# Patient Record
Sex: Female | Born: 1960 | Race: Black or African American | Hispanic: No | Marital: Single | Smoking: Current every day smoker
Health system: Southern US, Community
[De-identification: ages and names within clinical notes are randomized; demographics above are authoritative.]

## PROBLEM LIST (undated history)

## (undated) DIAGNOSIS — K759 Inflammatory liver disease, unspecified: Secondary | ICD-10-CM

## (undated) DIAGNOSIS — L658 Other specified nonscarring hair loss: Secondary | ICD-10-CM

## (undated) DIAGNOSIS — A599 Trichomoniasis, unspecified: Secondary | ICD-10-CM

## (undated) DIAGNOSIS — Z62810 Personal history of physical and sexual abuse in childhood: Secondary | ICD-10-CM

## (undated) DIAGNOSIS — F32A Depression, unspecified: Secondary | ICD-10-CM

## (undated) DIAGNOSIS — A630 Anogenital (venereal) warts: Secondary | ICD-10-CM

## (undated) DIAGNOSIS — R112 Nausea with vomiting, unspecified: Secondary | ICD-10-CM

## (undated) DIAGNOSIS — B977 Papillomavirus as the cause of diseases classified elsewhere: Secondary | ICD-10-CM

## (undated) DIAGNOSIS — R7303 Prediabetes: Secondary | ICD-10-CM

## (undated) DIAGNOSIS — F329 Major depressive disorder, single episode, unspecified: Secondary | ICD-10-CM

## (undated) DIAGNOSIS — I10 Essential (primary) hypertension: Secondary | ICD-10-CM

## (undated) DIAGNOSIS — M810 Age-related osteoporosis without current pathological fracture: Secondary | ICD-10-CM

## (undated) DIAGNOSIS — M1990 Unspecified osteoarthritis, unspecified site: Secondary | ICD-10-CM

## (undated) DIAGNOSIS — E785 Hyperlipidemia, unspecified: Secondary | ICD-10-CM

## (undated) DIAGNOSIS — B9689 Other specified bacterial agents as the cause of diseases classified elsewhere: Secondary | ICD-10-CM

## (undated) DIAGNOSIS — Z9889 Other specified postprocedural states: Secondary | ICD-10-CM

## (undated) DIAGNOSIS — N760 Acute vaginitis: Secondary | ICD-10-CM

## (undated) HISTORY — DX: Hyperlipidemia, unspecified: E78.5

## (undated) HISTORY — DX: Trichomoniasis, unspecified: A59.9

## (undated) HISTORY — DX: Essential (primary) hypertension: I10

## (undated) HISTORY — DX: Major depressive disorder, single episode, unspecified: F32.9

## (undated) HISTORY — DX: Age-related osteoporosis without current pathological fracture: M81.0

## (undated) HISTORY — DX: Other specified bacterial agents as the cause of diseases classified elsewhere: B96.89

## (undated) HISTORY — DX: Personal history of physical and sexual abuse in childhood: Z62.810

## (undated) HISTORY — DX: Morbid (severe) obesity due to excess calories: E66.01

## (undated) HISTORY — PX: TUBAL LIGATION: SHX77

## (undated) HISTORY — DX: Unspecified osteoarthritis, unspecified site: M19.90

## (undated) HISTORY — PX: COLONOSCOPY: SHX174

## (undated) HISTORY — DX: Anogenital (venereal) warts: A63.0

## (undated) HISTORY — DX: Other specified nonscarring hair loss: L65.8

## (undated) HISTORY — DX: Papillomavirus as the cause of diseases classified elsewhere: B97.7

## (undated) HISTORY — PX: CHOLECYSTECTOMY: SHX55

## (undated) HISTORY — DX: Depression, unspecified: F32.A

## (undated) HISTORY — DX: Other specified bacterial agents as the cause of diseases classified elsewhere: N76.0

---
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
## 1997-09-04 ENCOUNTER — Ambulatory Visit: Admission: RE | Admit: 1997-09-04 | Discharge: 1997-09-04 | Payer: Self-pay | Admitting: Internal Medicine

## 1997-09-11 ENCOUNTER — Ambulatory Visit (HOSPITAL_COMMUNITY): Admission: RE | Admit: 1997-09-11 | Discharge: 1997-09-11 | Payer: Self-pay | Admitting: Otolaryngology

## 1997-10-13 ENCOUNTER — Encounter: Admission: RE | Admit: 1997-10-13 | Discharge: 1997-10-13 | Payer: Self-pay | Admitting: Internal Medicine

## 1997-11-30 ENCOUNTER — Emergency Department (HOSPITAL_COMMUNITY): Admission: EM | Admit: 1997-11-30 | Discharge: 1997-11-30 | Payer: Self-pay | Admitting: Emergency Medicine

## 1997-12-20 ENCOUNTER — Emergency Department (HOSPITAL_COMMUNITY): Admission: EM | Admit: 1997-12-20 | Discharge: 1997-12-20 | Payer: Self-pay | Admitting: Emergency Medicine

## 1998-06-12 ENCOUNTER — Emergency Department (HOSPITAL_COMMUNITY): Admission: EM | Admit: 1998-06-12 | Discharge: 1998-06-12 | Payer: Self-pay | Admitting: Emergency Medicine

## 1998-08-28 ENCOUNTER — Emergency Department (HOSPITAL_COMMUNITY): Admission: EM | Admit: 1998-08-28 | Discharge: 1998-08-28 | Payer: Self-pay | Admitting: Emergency Medicine

## 1998-12-14 ENCOUNTER — Emergency Department (HOSPITAL_COMMUNITY): Admission: EM | Admit: 1998-12-14 | Discharge: 1998-12-14 | Payer: Self-pay | Admitting: Emergency Medicine

## 1998-12-14 ENCOUNTER — Encounter: Payer: Self-pay | Admitting: Emergency Medicine

## 1999-05-27 ENCOUNTER — Emergency Department (HOSPITAL_COMMUNITY): Admission: EM | Admit: 1999-05-27 | Discharge: 1999-05-27 | Payer: Self-pay | Admitting: Emergency Medicine

## 1999-06-03 ENCOUNTER — Encounter: Admission: RE | Admit: 1999-06-03 | Discharge: 1999-06-03 | Payer: Self-pay | Admitting: *Deleted

## 1999-07-12 ENCOUNTER — Other Ambulatory Visit: Admission: RE | Admit: 1999-07-12 | Discharge: 1999-07-27 | Payer: Self-pay | Admitting: *Deleted

## 1999-07-12 ENCOUNTER — Encounter: Admission: RE | Admit: 1999-07-12 | Discharge: 1999-07-12 | Payer: Self-pay | Admitting: Internal Medicine

## 1999-09-14 ENCOUNTER — Encounter: Admission: RE | Admit: 1999-09-14 | Discharge: 1999-09-14 | Payer: Self-pay | Admitting: Internal Medicine

## 1999-10-04 ENCOUNTER — Encounter: Admission: RE | Admit: 1999-10-04 | Discharge: 1999-10-04 | Payer: Self-pay | Admitting: Internal Medicine

## 1999-11-15 ENCOUNTER — Encounter: Admission: RE | Admit: 1999-11-15 | Discharge: 1999-11-15 | Payer: Self-pay | Admitting: Internal Medicine

## 2000-03-16 ENCOUNTER — Encounter: Admission: RE | Admit: 2000-03-16 | Discharge: 2000-03-16 | Payer: Self-pay | Admitting: Internal Medicine

## 2000-08-25 ENCOUNTER — Emergency Department (HOSPITAL_COMMUNITY): Admission: EM | Admit: 2000-08-25 | Discharge: 2000-08-25 | Payer: Self-pay | Admitting: Emergency Medicine

## 2001-12-03 ENCOUNTER — Emergency Department (HOSPITAL_COMMUNITY): Admission: EM | Admit: 2001-12-03 | Discharge: 2001-12-03 | Payer: Self-pay | Admitting: Emergency Medicine

## 2001-12-25 ENCOUNTER — Encounter: Admission: RE | Admit: 2001-12-25 | Discharge: 2001-12-25 | Payer: Self-pay | Admitting: *Deleted

## 2002-01-10 ENCOUNTER — Encounter: Admission: RE | Admit: 2002-01-10 | Discharge: 2002-01-10 | Payer: Self-pay | Admitting: Obstetrics and Gynecology

## 2002-05-21 ENCOUNTER — Encounter: Payer: Self-pay | Admitting: Internal Medicine

## 2002-05-21 ENCOUNTER — Encounter: Admission: RE | Admit: 2002-05-21 | Discharge: 2002-05-21 | Payer: Self-pay | Admitting: Internal Medicine

## 2002-09-30 ENCOUNTER — Emergency Department (HOSPITAL_COMMUNITY): Admission: EM | Admit: 2002-09-30 | Discharge: 2002-09-30 | Payer: Self-pay | Admitting: *Deleted

## 2002-09-30 ENCOUNTER — Encounter: Payer: Self-pay | Admitting: *Deleted

## 2003-04-14 ENCOUNTER — Emergency Department (HOSPITAL_COMMUNITY): Admission: EM | Admit: 2003-04-14 | Discharge: 2003-04-14 | Payer: Self-pay | Admitting: *Deleted

## 2003-04-18 ENCOUNTER — Emergency Department (HOSPITAL_COMMUNITY): Admission: EM | Admit: 2003-04-18 | Discharge: 2003-04-18 | Payer: Self-pay | Admitting: Emergency Medicine

## 2003-04-21 ENCOUNTER — Encounter: Admission: RE | Admit: 2003-04-21 | Discharge: 2003-04-21 | Payer: Self-pay | Admitting: Internal Medicine

## 2003-06-23 ENCOUNTER — Encounter: Admission: RE | Admit: 2003-06-23 | Discharge: 2003-06-23 | Payer: Self-pay | Admitting: Internal Medicine

## 2003-07-24 ENCOUNTER — Encounter: Admission: RE | Admit: 2003-07-24 | Discharge: 2003-07-24 | Payer: Self-pay | Admitting: Obstetrics and Gynecology

## 2003-07-30 ENCOUNTER — Ambulatory Visit (HOSPITAL_COMMUNITY): Admission: RE | Admit: 2003-07-30 | Discharge: 2003-07-30 | Payer: Self-pay | Admitting: Obstetrics and Gynecology

## 2004-05-13 ENCOUNTER — Ambulatory Visit: Payer: Self-pay | Admitting: Internal Medicine

## 2004-07-22 ENCOUNTER — Encounter (INDEPENDENT_AMBULATORY_CARE_PROVIDER_SITE_OTHER): Payer: Self-pay | Admitting: Ophthalmology

## 2004-07-22 ENCOUNTER — Ambulatory Visit: Payer: Self-pay | Admitting: Internal Medicine

## 2004-07-22 LAB — CONVERTED CEMR LAB: Pap Smear: NORMAL

## 2005-02-07 ENCOUNTER — Emergency Department (HOSPITAL_COMMUNITY): Admission: EM | Admit: 2005-02-07 | Discharge: 2005-02-07 | Payer: Self-pay | Admitting: Emergency Medicine

## 2005-04-11 ENCOUNTER — Ambulatory Visit: Payer: Self-pay | Admitting: Internal Medicine

## 2005-04-11 ENCOUNTER — Encounter (INDEPENDENT_AMBULATORY_CARE_PROVIDER_SITE_OTHER): Payer: Self-pay | Admitting: Pulmonary Disease

## 2005-04-12 ENCOUNTER — Ambulatory Visit: Payer: Self-pay | Admitting: Internal Medicine

## 2005-05-05 ENCOUNTER — Ambulatory Visit (HOSPITAL_COMMUNITY): Admission: RE | Admit: 2005-05-05 | Discharge: 2005-05-05 | Payer: Self-pay | Admitting: Family Medicine

## 2005-07-14 ENCOUNTER — Ambulatory Visit: Payer: Self-pay | Admitting: Internal Medicine

## 2005-07-14 ENCOUNTER — Encounter (INDEPENDENT_AMBULATORY_CARE_PROVIDER_SITE_OTHER): Payer: Self-pay | Admitting: Pulmonary Disease

## 2005-09-06 ENCOUNTER — Emergency Department (HOSPITAL_COMMUNITY): Admission: EM | Admit: 2005-09-06 | Discharge: 2005-09-06 | Payer: Self-pay | Admitting: Emergency Medicine

## 2006-03-23 ENCOUNTER — Encounter (INDEPENDENT_AMBULATORY_CARE_PROVIDER_SITE_OTHER): Payer: Self-pay | Admitting: Ophthalmology

## 2006-03-23 DIAGNOSIS — I10 Essential (primary) hypertension: Secondary | ICD-10-CM

## 2006-03-23 DIAGNOSIS — Z720 Tobacco use: Secondary | ICD-10-CM | POA: Insufficient documentation

## 2006-03-23 DIAGNOSIS — B977 Papillomavirus as the cause of diseases classified elsewhere: Secondary | ICD-10-CM

## 2006-03-23 DIAGNOSIS — E785 Hyperlipidemia, unspecified: Secondary | ICD-10-CM | POA: Insufficient documentation

## 2006-04-08 ENCOUNTER — Emergency Department (HOSPITAL_COMMUNITY): Admission: EM | Admit: 2006-04-08 | Discharge: 2006-04-08 | Payer: Self-pay | Admitting: *Deleted

## 2006-04-19 ENCOUNTER — Ambulatory Visit: Payer: Self-pay | Admitting: Hospitalist

## 2006-04-19 ENCOUNTER — Encounter (INDEPENDENT_AMBULATORY_CARE_PROVIDER_SITE_OTHER): Payer: Self-pay | Admitting: Pulmonary Disease

## 2006-08-07 ENCOUNTER — Telehealth: Payer: Self-pay | Admitting: *Deleted

## 2006-08-24 ENCOUNTER — Ambulatory Visit: Payer: Self-pay | Admitting: Internal Medicine

## 2006-08-24 ENCOUNTER — Encounter (INDEPENDENT_AMBULATORY_CARE_PROVIDER_SITE_OTHER): Payer: Self-pay | Admitting: Pulmonary Disease

## 2006-08-24 ENCOUNTER — Other Ambulatory Visit: Admission: RE | Admit: 2006-08-24 | Discharge: 2006-08-24 | Payer: Self-pay | Admitting: Internal Medicine

## 2006-08-24 LAB — CONVERTED CEMR LAB
BUN: 14 mg/dL (ref 6–23)
Beta hcg, urine, semiquantitative: NEGATIVE
Bilirubin Urine: NEGATIVE
CO2: 22 meq/L (ref 19–32)
Calcium: 8.8 mg/dL (ref 8.4–10.5)
Candida species: NEGATIVE
Chlamydia, DNA Probe: NEGATIVE
Chloride: 105 meq/L (ref 96–112)
Creatinine, Ser: 0.69 mg/dL (ref 0.40–1.20)
GC Probe Amp, Genital: NEGATIVE
Gardnerella vaginalis: NEGATIVE
Glucose, Bld: 82 mg/dL (ref 70–99)
Hemoglobin, Urine: NEGATIVE
Ketones, ur: NEGATIVE mg/dL
Leukocytes, UA: NEGATIVE
Nitrite: NEGATIVE
Potassium: 4.1 meq/L (ref 3.5–5.3)
Protein, ur: NEGATIVE mg/dL
Sodium: 140 meq/L (ref 135–145)
Specific Gravity, Urine: 1.026 (ref 1.005–1.03)
TSH: 0.894 microintl units/mL (ref 0.350–5.50)
Trichomonal Vaginitis: NEGATIVE
Urine Glucose: NEGATIVE mg/dL
Urobilinogen, UA: 0.2 (ref 0.0–1.0)
pH: 6 (ref 5.0–8.0)

## 2006-09-06 ENCOUNTER — Telehealth: Payer: Self-pay | Admitting: *Deleted

## 2006-09-12 ENCOUNTER — Ambulatory Visit (HOSPITAL_COMMUNITY): Admission: RE | Admit: 2006-09-12 | Discharge: 2006-09-12 | Payer: Self-pay | Admitting: Obstetrics and Gynecology

## 2006-09-14 ENCOUNTER — Ambulatory Visit: Payer: Self-pay | Admitting: Internal Medicine

## 2006-10-11 ENCOUNTER — Encounter (INDEPENDENT_AMBULATORY_CARE_PROVIDER_SITE_OTHER): Payer: Self-pay | Admitting: Pulmonary Disease

## 2006-10-11 ENCOUNTER — Ambulatory Visit: Payer: Self-pay | Admitting: Internal Medicine

## 2006-10-11 LAB — CONVERTED CEMR LAB
Bilirubin Urine: NEGATIVE
Hemoglobin, Urine: NEGATIVE
Ketones, ur: NEGATIVE mg/dL
Leukocytes, UA: NEGATIVE
Nitrite: NEGATIVE
Protein, ur: NEGATIVE mg/dL
Specific Gravity, Urine: 1.024 (ref 1.005–1.03)
Urine Glucose: NEGATIVE mg/dL
Urobilinogen, UA: 0.2 (ref 0.0–1.0)
pH: 8 (ref 5.0–8.0)

## 2006-10-20 ENCOUNTER — Ambulatory Visit (HOSPITAL_COMMUNITY): Admission: RE | Admit: 2006-10-20 | Discharge: 2006-10-20 | Payer: Self-pay | Admitting: Obstetrics & Gynecology

## 2006-10-24 ENCOUNTER — Ambulatory Visit: Payer: Self-pay

## 2006-11-15 ENCOUNTER — Encounter (INDEPENDENT_AMBULATORY_CARE_PROVIDER_SITE_OTHER): Payer: Self-pay | Admitting: Internal Medicine

## 2007-02-27 ENCOUNTER — Ambulatory Visit: Payer: Self-pay | Admitting: Internal Medicine

## 2007-02-27 LAB — CONVERTED CEMR LAB
ALT: 13 units/L (ref 0–35)
AST: 14 units/L (ref 0–37)
Albumin: 3.9 g/dL (ref 3.5–5.2)
Alkaline Phosphatase: 54 units/L (ref 39–117)
BUN: 10 mg/dL (ref 6–23)
CO2: 27 meq/L (ref 19–32)
Calcium: 8.8 mg/dL (ref 8.4–10.5)
Chloride: 107 meq/L (ref 96–112)
Cholesterol: 182 mg/dL (ref 0–200)
Creatinine, Ser: 0.7 mg/dL (ref 0.40–1.20)
Glucose, Bld: 83 mg/dL (ref 70–99)
HDL: 54 mg/dL (ref >39)
LDL Cholesterol: 118 mg/dL — ABNORMAL HIGH (ref 0–99)
Lipase: 26 units/L (ref 0–75)
Potassium: 4.2 meq/L (ref 3.5–5.3)
Sodium: 142 meq/L (ref 135–145)
Total Bilirubin: 0.6 mg/dL (ref 0.3–1.2)
Total CHOL/HDL Ratio: 3.4
Total Protein: 6.4 g/dL (ref 6.0–8.3)
Triglycerides: 52 mg/dL (ref <150)
VLDL: 10 mg/dL (ref 0–40)

## 2007-03-22 ENCOUNTER — Ambulatory Visit: Payer: Self-pay | Admitting: Internal Medicine

## 2007-06-11 ENCOUNTER — Ambulatory Visit: Payer: Self-pay | Admitting: Internal Medicine

## 2007-07-24 ENCOUNTER — Ambulatory Visit: Payer: Self-pay | Admitting: Internal Medicine

## 2007-09-21 ENCOUNTER — Ambulatory Visit (HOSPITAL_COMMUNITY): Admission: RE | Admit: 2007-09-21 | Discharge: 2007-09-21 | Payer: Self-pay | Admitting: Internal Medicine

## 2007-10-03 ENCOUNTER — Encounter (INDEPENDENT_AMBULATORY_CARE_PROVIDER_SITE_OTHER): Payer: Self-pay | Admitting: Internal Medicine

## 2007-10-23 ENCOUNTER — Encounter (INDEPENDENT_AMBULATORY_CARE_PROVIDER_SITE_OTHER): Payer: Self-pay | Admitting: Internal Medicine

## 2007-10-23 ENCOUNTER — Ambulatory Visit: Payer: Self-pay | Admitting: Internal Medicine

## 2007-10-23 LAB — CONVERTED CEMR LAB
BUN: 12 mg/dL (ref 6–23)
CO2: 23 meq/L (ref 19–32)
Calcium: 8.9 mg/dL (ref 8.4–10.5)
Chloride: 105 meq/L (ref 96–112)
Creatinine, Ser: 0.78 mg/dL (ref 0.40–1.20)
Glucose, Bld: 82 mg/dL (ref 70–99)
HCT: 42.5 % (ref 36.0–46.0)
Hemoglobin: 14.9 g/dL (ref 12.0–15.0)
MCHC: 35.1 g/dL (ref 30.0–36.0)
MCV: 89.3 fL (ref 78.0–100.0)
Platelets: 244 10*3/uL (ref 150–400)
Potassium: 3.9 meq/L (ref 3.5–5.3)
RBC: 4.76 M/uL (ref 3.87–5.11)
RDW: 13.5 % (ref 11.5–15.5)
Sodium: 139 meq/L (ref 135–145)
TSH: 0.919 microintl units/mL (ref 0.350–5.50)
WBC: 4.8 10*3/uL (ref 4.0–10.5)

## 2007-10-30 ENCOUNTER — Encounter (INDEPENDENT_AMBULATORY_CARE_PROVIDER_SITE_OTHER): Payer: Self-pay | Admitting: Internal Medicine

## 2007-12-10 ENCOUNTER — Telehealth: Payer: Self-pay | Admitting: *Deleted

## 2008-02-27 ENCOUNTER — Ambulatory Visit: Payer: Self-pay | Admitting: Internal Medicine

## 2008-02-27 LAB — CONVERTED CEMR LAB
Bilirubin Urine: NEGATIVE
Glucose, Urine, Semiquant: NEGATIVE
Ketones, urine, test strip: NEGATIVE
Protein, U semiquant: NEGATIVE
Specific Gravity, Urine: 1.01
Urobilinogen, UA: 0.2
WBC Urine, dipstick: NEGATIVE
pH: 7.5

## 2008-03-03 ENCOUNTER — Ambulatory Visit: Payer: Self-pay | Admitting: Internal Medicine

## 2008-03-25 ENCOUNTER — Ambulatory Visit: Payer: Self-pay | Admitting: Internal Medicine

## 2008-07-11 ENCOUNTER — Ambulatory Visit: Payer: Self-pay | Admitting: Internal Medicine

## 2008-09-22 ENCOUNTER — Encounter (INDEPENDENT_AMBULATORY_CARE_PROVIDER_SITE_OTHER): Payer: Self-pay | Admitting: Internal Medicine

## 2008-09-30 ENCOUNTER — Ambulatory Visit (HOSPITAL_COMMUNITY): Admission: RE | Admit: 2008-09-30 | Discharge: 2008-09-30 | Payer: Self-pay | Admitting: Internal Medicine

## 2008-10-21 ENCOUNTER — Encounter (INDEPENDENT_AMBULATORY_CARE_PROVIDER_SITE_OTHER): Payer: Self-pay | Admitting: Internal Medicine

## 2008-10-21 ENCOUNTER — Ambulatory Visit: Payer: Self-pay | Admitting: Internal Medicine

## 2008-10-22 ENCOUNTER — Encounter (INDEPENDENT_AMBULATORY_CARE_PROVIDER_SITE_OTHER): Payer: Self-pay | Admitting: Internal Medicine

## 2008-10-22 LAB — CONVERTED CEMR LAB
Candida species: NEGATIVE
Gardnerella vaginalis: POSITIVE — AB
Trichomonal Vaginitis: POSITIVE — AB

## 2008-10-28 ENCOUNTER — Ambulatory Visit: Payer: Self-pay | Admitting: Internal Medicine

## 2008-11-04 LAB — CONVERTED CEMR LAB
BUN: 13 mg/dL (ref 6–23)
CO2: 25 meq/L (ref 19–32)
Calcium: 9 mg/dL (ref 8.4–10.5)
Chloride: 105 meq/L (ref 96–112)
Cholesterol: 174 mg/dL (ref 0–200)
Creatinine, Ser: 0.84 mg/dL (ref 0.40–1.20)
GFR calc Af Amer: >60 mL/min (ref >60)
GFR calc non Af Amer: >60 mL/min (ref >60)
Glucose, Bld: 89 mg/dL (ref 70–99)
HDL: 42 mg/dL (ref >39)
LDL Cholesterol: 110 mg/dL — ABNORMAL HIGH (ref 0–99)
Potassium: 4.6 meq/L (ref 3.5–5.3)
Sodium: 141 meq/L (ref 135–145)
Total CHOL/HDL Ratio: 4.1
Triglycerides: 108 mg/dL (ref <150)
VLDL: 22 mg/dL (ref 0–40)

## 2008-12-09 ENCOUNTER — Ambulatory Visit: Payer: Self-pay | Admitting: Infectious Diseases

## 2009-01-03 ENCOUNTER — Emergency Department (HOSPITAL_COMMUNITY): Admission: EM | Admit: 2009-01-03 | Discharge: 2009-01-03 | Payer: Self-pay | Admitting: Emergency Medicine

## 2009-03-03 ENCOUNTER — Ambulatory Visit: Payer: Self-pay | Admitting: Internal Medicine

## 2009-03-03 DIAGNOSIS — M1710 Unilateral primary osteoarthritis, unspecified knee: Secondary | ICD-10-CM

## 2009-03-03 DIAGNOSIS — IMO0002 Reserved for concepts with insufficient information to code with codable children: Secondary | ICD-10-CM | POA: Insufficient documentation

## 2009-06-01 ENCOUNTER — Telehealth: Payer: Self-pay | Admitting: Internal Medicine

## 2009-08-04 ENCOUNTER — Ambulatory Visit: Payer: Self-pay | Admitting: Internal Medicine

## 2009-08-04 DIAGNOSIS — F418 Other specified anxiety disorders: Secondary | ICD-10-CM | POA: Insufficient documentation

## 2009-08-04 LAB — CONVERTED CEMR LAB
Chlamydia, DNA Probe: NEGATIVE
GC Probe Amp, Genital: NEGATIVE

## 2009-08-05 ENCOUNTER — Ambulatory Visit: Payer: Self-pay | Admitting: Internal Medicine

## 2009-08-07 LAB — CONVERTED CEMR LAB
ALT: 20 units/L (ref 0–35)
AST: 21 units/L (ref 0–37)
Albumin: 4 g/dL (ref 3.5–5.2)
Alkaline Phosphatase: 66 units/L (ref 39–117)
BUN: 11 mg/dL (ref 6–23)
CO2: 23 meq/L (ref 19–32)
Calcium: 9.2 mg/dL (ref 8.4–10.5)
Chloride: 102 meq/L (ref 96–112)
Cholesterol: 232 mg/dL — ABNORMAL HIGH (ref 0–200)
Creatinine, Ser: 0.76 mg/dL (ref 0.40–1.20)
Glucose, Bld: 82 mg/dL (ref 70–99)
HDL: 50 mg/dL (ref >39)
LDL Cholesterol: 161 mg/dL — ABNORMAL HIGH (ref 0–99)
Potassium: 3.7 meq/L (ref 3.5–5.3)
Sodium: 137 meq/L (ref 135–145)
Total Bilirubin: 1.2 mg/dL (ref 0.3–1.2)
Total CHOL/HDL Ratio: 4.6
Total Protein: 6.9 g/dL (ref 6.0–8.3)
Triglycerides: 104 mg/dL (ref <150)
VLDL: 21 mg/dL (ref 0–40)

## 2009-09-16 ENCOUNTER — Ambulatory Visit: Payer: Self-pay | Admitting: Internal Medicine

## 2010-03-09 ENCOUNTER — Ambulatory Visit: Payer: Self-pay | Admitting: Internal Medicine

## 2010-03-09 DIAGNOSIS — K219 Gastro-esophageal reflux disease without esophagitis: Secondary | ICD-10-CM | POA: Insufficient documentation

## 2010-03-24 ENCOUNTER — Ambulatory Visit (HOSPITAL_COMMUNITY): Admission: RE | Admit: 2010-03-24 | Discharge: 2010-03-24 | Payer: Self-pay | Admitting: Internal Medicine

## 2010-04-27 ENCOUNTER — Ambulatory Visit: Payer: Self-pay | Admitting: Internal Medicine

## 2010-05-03 LAB — CONVERTED CEMR LAB: Pap Smear: NEGATIVE

## 2010-05-21 ENCOUNTER — Emergency Department (HOSPITAL_COMMUNITY)
Admission: EM | Admit: 2010-05-21 | Discharge: 2010-05-21 | Payer: Self-pay | Source: Home / Self Care | Admitting: Emergency Medicine

## 2010-05-22 ENCOUNTER — Emergency Department (HOSPITAL_COMMUNITY)
Admission: EM | Admit: 2010-05-22 | Discharge: 2010-05-22 | Payer: Self-pay | Source: Home / Self Care | Admitting: Emergency Medicine

## 2010-06-27 ENCOUNTER — Encounter: Payer: Self-pay | Admitting: Internal Medicine

## 2010-07-06 ENCOUNTER — Ambulatory Visit: Admit: 2010-07-06 | Payer: Self-pay | Admitting: Internal Medicine

## 2010-07-08 NOTE — Assessment & Plan Note (Signed)
Summary: CHECKUP/SB.   Vital Signs:  Patient profile:   50 year old female Height:      66 inches (167.64 cm) Weight:      294.0 pounds (133.64 kg) BMI:     47.62 Temp:     97.0 degrees F (36.11 degrees C) oral Pulse rate:   77 / minute BP sitting:   123 / 84  (right arm) Cuff size:   large  Vitals Entered By: Krystal Eaton Duncan Dull) (August 04, 2009 9:18 AM) CC: vaginal discharge Is Patient Diabetic? No Pain Assessment Patient in pain? no      Nutritional Status BMI of > 30 = obese  Have you ever been in a relationship where you felt threatened, hurt or afraid?No   Does patient need assistance? Functional Status Self care Ambulation Normal   Primary Care Provider:  Zoila Shutter MD  CC:  vaginal discharge.  History of Present Illness: 50 yr. old who comes in to establish primary care.  She wants to have STD check today. In 5/10 she had trich. Her partner of 4 years is probably still unfaithful.  She needs her HCTZ filled.   She is depressed. Her father died last year. She is in an unhealthy relationship. She has been without work for 3 years. She decides suicidal ideations.     Preventive Screening-Counseling & Management  Alcohol-Tobacco     Smoking Status: current     Smoking Cessation Counseling: yes     Packs/Day: 0.5     Year Started: 1970's     Year Quit: 2 weeks  Current Medications (verified): 1)  Hydrochlorothiazide 25 Mg  Tabs (Hydrochlorothiazide) .... Take 1 Tablet By Mouth Once A Day 2)  Fish Oil Concentrate 1000 Mg  Caps (Omega-3 Fatty Acids) .... Take 1 Tablet By Mouth Once A Day 3)  Oyster Calcium 500 Mg  Tabs (Oyster Shell) .... Take 1 Tablet By Mouth Two Times A Day  Allergies: 1)  ! 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 CV:  Denies chest pain or discomfort and palpitations. Resp:  Denies cough and shortness of breath. GI:  Denies abdominal pain, nausea, and vomiting. GU:  Complains of discharge; minimal-may be her baseline. Psych:  Complains of depression; denies suicidal thoughts/plans.  Physical Exam  General:  alert and well-nourished.   Head:  normocephalic and atraumatic.   Eyes:  vision grossly intact.   Neck:  supple and full ROM.   Lungs:  normal respiratory effort and normal breath sounds.   Heart:  normal rate and regular rhythm.   Abdomen:  soft, non-tender, and normal bowel sounds.   Genitalia:  external condyloma acuminatum lesions in perineal area and perirectal area, minimal discharge in vaginal vault. GC/chlymydia, wet prep obtained  Psych:  Oriented X3, memory intact for recent and remote, and  depressed affect.     Impression & Recommendations:  Problem # 1:  DEPRESSIVE DISORDER (ICD-311) I spent a lot of time with pt. today discussing this. Will begin sertaline 100mg  1/2 a pill a day for 6 days, then 1 a day. I have told her it may  take  several weeks for her yo feel a difference. Risks benefits, and side effects explains. I will see her back in one month for follow up Her updated medication list for this problem includes:    Sertraline Hcl 100 Mg Tabs (Sertraline  hcl) .Marland Kitchen... 1/2 pill a day for 6 days, then 1 a day  Problem # 2:  HYPERTENSION (ICD-401.9) Well controlled on present medication. Refilled. Her updated medication list for this problem includes:    Hydrochlorothiazide 25 Mg Tabs (Hydrochlorothiazide) .Marland Kitchen... Take 1 tablet by mouth once a day  Future Orders: T-Comprehensive Metabolic Panel ([id number]) ... 08/11/2009  Problem # 3:  SCREENING EXAMINATION FOR VENEREAL DISEASE (ICD-V74.5) Pelvic exam done today, GC/Chlymydia and wet prep pending. Future Orders: T-HIV Antibody  (Reflex) ([id number]) ... 08/11/2009  Problem # 4:  MORBID OBESITY (ICD-278.01) Pt. has done well keeping off 100 pounds, but has gained 5 back. Enccouraged her that she was doing great and after treating the depression this would get easier.   Problem # 5:  HYPERLIPIDEMIA (ICD-272.4)  Future Orders: T-Lipid Profile ([id number]) ... 08/11/2009  Problem # 6:  Preventive Health Care (ICD-V70.0) Mammogram ordered. pap normal in 5/10. She will return for fasting blood work.  Complete Medication List: 1)  Hydrochlorothiazide 25 Mg Tabs (Hydrochlorothiazide) .... Take 1 tablet by mouth once a day 2)  Fish Oil Concentrate 1000 Mg Caps (Omega-3 fatty acids) .... Take 1 tablet by mouth once a day 3)  Oyster Calcium 500 Mg Tabs (Oyster shell) .... Take 1 tablet by mouth two times a day 4)  Sertraline Hcl 100 Mg Tabs (Sertraline hcl) .... 1/2 pill a day for 6 days, then 1 a day  Other Orders: Mammogram (Screening) (Mammo) T-Chlamydia & GC Probe, Genital ([id number]) T-Wet Prep by Molecular Probe [phone])  Patient Instructions: 1)  Please schedule a follow-up appointment in 1 month. 2)  Please take the Sertaline as directed on the bottle. 1/2 a pill for 6 days, then 1 pill a day. 3)  Please schedule patient to return for fasting labs this week. 4)  We will let you know when results of your tests come back. 5)  Take good care of  yourself!!! Prescriptions: HYDROCHLOROTHIAZIDE 25 MG  TABS (HYDROCHLOROTHIAZIDE) Take 1 tablet by mouth once a day  #31 x 11   Entered and Authorized by:   Zoila Shutter MD   Signed by:   Zoila Shutter MD on 08/04/2009   Method used:   Electronically to        [facility]  [address] [id number] (retail)       [address]       [place], [place]  [address]       Ph: [phone] or [phone]       Fax: [phone]   RxID:   [id number] SERTRALINE HCL 100 MG TABS (SERTRALINE HCL) 1/2 pill a day for 6 days, then 1 a day  #31 x 6   Entered and Authorized by:   Zoila Shutter MD   Signed by:   Zoila Shutter MD on 08/04/2009   Method used:   Electronically to  [facility]  [address] [id number] (retail)       [address]       [place], [place]  [address]       Ph: [phone] or [phone]       Fax: [phone]   RxID:   [id number]  Process Orders Check Orders Results:     [facility]: ABN not required for this insurance Tests Sent for requisitioning (August 04, 2009 9:03 PM):     08/04/2009: [facility] -- T-Chlamydia & GC Probe, Genital [[id number]] (signed)     08/04/2009: [facility] -- T-Wet Prep by Molecular Probe [phone] (signed)     08/11/2009: [facility] -- T-Lipid Profile [id number] (signed)     08/11/2009: [facility] -- T-Comprehensive Metabolic Panel [id number] (signed)     08/11/2009: [facility] -- T-HIV Antibody  (Reflex) [[id number]] (signed)    Prevention & Chronic Care Immunizations   Influenza vaccine: Fluvax Non-MCR  (03/03/2009)   Influenza vaccine deferral: Deferred  (03/03/2009)    Tetanus booster: Not documented    Pneumococcal vaccine: Not documented  Other Screening   Pap smear: NEGATIVE FOR INTRAEPITHELIAL LESIONS OR MALIGNANCY.  (10/21/2008)   Pap smear action/deferral: GYN Referral   (03/03/2009)   Pap smear due: 11/2008    Mammogram: ASSESSMENT: Negative - BI-RADS 1^MM DIGITAL SCREENING  (09/30/2008)   Mammogram action/deferral: Ordered  (08/04/2009)   Mammogram due: 10/2008   Smoking status: current  (08/04/2009)   Smoking cessation counseling: yes  (08/04/2009)  Lipids   Total Cholesterol: 174  (10/28/2008)   LDL: 110  (10/28/2008)   LDL Direct: Not documented   HDL: 42  (10/28/2008)   Triglycerides: 108  (10/28/2008)    SGOT (AST): 14  (02/27/2007)   BMP action: Ordered   SGPT (ALT): 13  (02/27/2007) CMP ordered    Alkaline phosphatase: 54  (02/27/2007)   Total bilirubin: 0.6  (02/27/2007)  Hypertension   Last Blood Pressure: 123 / 84  (08/04/2009)   Serum creatinine: 0.84  (10/28/2008)   Serum potassium 4.6  (10/28/2008) CMP ordered   Self-Management Support :   Personal Goals (by the next clinic visit) :      Personal blood pressure goal: 140/90  (03/03/2009)     Personal LDL goal: 130  (03/03/2009)    Patient will work on the following items until the next clinic visit to reach self-care goals:     Medications and monitoring: take my medicines every day  (08/04/2009)     Eating: eat foods that are low in salt, eat baked foods instead of fried foods  (08/04/2009)     Activity: join a walking program  (08/04/2009)    Hypertension self-management support: Not documented    Lipid self-management support: Not documented    Nursing Instructions: Schedule screening mammogram (see order)    Process Orders Check Orders Results:     [facility]: ABN not required for this insurance Tests Sent for requisitioning (August 04, 2009 9:03 PM):     08/04/2009: [facility] -- T-Chlamydia & GC Probe, Genital [[id number]] (signed)     08/04/2009: [facility] -- T-Wet Prep by Molecular Probe [phone] (signed)     08/11/2009: [facility] -- T-Lipid Profile [phone] (signed)      08/11/2009: [facility] -- T-Comprehensive Metabolic Panel [[id number]] (signed)     08/11/2009: [facility] -- T-HIV Antibody  (Reflex) [[id number]] (  signed)

## 2010-07-08 NOTE — Assessment & Plan Note (Signed)
Summary: EST-CK/FU/MEDS/CFB   Vital Signs:  Patient profile:   50 year old female Weight:      283.7 pounds (128.95 kg) BMI:     45.96 Temp:     97.5 degrees F (36.39 degrees C) oral Pulse rate:   68 / minute BP sitting:   124 / 90  (left arm) Cuff size:   large  Vitals Entered By: Cynda Familia Duncan Dull) (September 16, 2009 3:16 PM) CC: pt c/o pain x right leg/knee pain, f/u Depression-has been doing better since starting zoloft Is Patient Diabetic? No Pain Assessment Patient in pain? yes     Location: right leg Intensity: 10 Type: sharp Onset of pain  Constant x Nutritional Status BMI of > 30 = obese  Have you ever been in a relationship where you felt threatened, hurt or afraid?No   Does patient need assistance? Functional Status Self care Ambulation Normal   Primary Care Provider:  Zoila Shutter MD  CC:  pt c/o pain x right leg/knee pain and f/u Depression-has been doing better since starting zoloft.  History of Present Illness: [name] comes in for 1 month follow up of depression and other concerns. She is doing [facility] better. Her affect is completely different. She is happy and hopeful. She just had a job interview today.  She is no longer in the unhealthy relationship that she was in for 4 years. She has found someone else who she says treats her well.   She is very excited to learn that she has lost 11 pounds since she was last in. She has been walking alot. Unfortunately her right knee has recently started hurting. It hurts whether she is exercising or not but is worse with exercise.   Pt. also mentions that she has started taking some calcium 600mg  + vitamin D 400 International Units 1 a day.  She has had no further vaginal symptoms and all of her studies were negative.  Depression History:      The patient denies a depressed mood most of the day and a diminished interest in her usual daily activities.         Preventive  Screening-Counseling & Management  Alcohol-Tobacco     Smoking Status: current     Smoking Cessation Counseling: yes     Packs/Day: 0.5     Year Started: 1970's     Year Quit: 2 weeks  Current Medications (verified): 1)  Hydrochlorothiazide 25 Mg  Tabs (Hydrochlorothiazide) .... Take 1 Tablet By Mouth Once A Day 2)  Fish Oil Concentrate 1000 Mg  Caps (Omega-3 Fatty Acids) .... Take 1 Tablet By Mouth Once A Day 3)  Oyster Calcium 500 Mg  Tabs (Oyster Shell) .... Take 1 Tablet By Mouth Two Times A Day 4)  Sertraline Hcl 100 Mg Tabs (Sertraline Hcl) .... 1/2 Pill A Day For 6 Days, Then 1 A Day  Allergies: 1)  ! Latex Exam Gloves (Disposable Gloves)  Past History:  Past Medical History: Reviewed history from 08/04/2009 and no changes required. 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 Family History Diabetes 1st degree relative - mother and father Family History Hypertension - mother and father Father died of papillary thyroid cancer-1/10  Social History: Reviewed history from 07/11/2008 and no changes required. 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 CV:  Denies chest pain or discomfort and palpitations. Resp:  Denies cough and shortness of breath. GI:  Denies abdominal pain. MS:  right knee as per HPI.  Physical Exam  General:  alert.   Neck:  supple and full ROM.   Lungs:  normal respiratory effort and normal breath sounds.   Heart:  normal rate, regular rhythm, and no murmur.   Abdomen:   soft, non-tender, and normal bowel sounds.   Extremities:  no edema   Knee Exam  Palpation:    R knee tender in posterior area,no joint effusion,diffuse tenderness anterially   Impression & Recommendations:  Problem # 1:  DEPRESSIVE DISORDER (ICD-311) She is doing great.Continue Zoloft. Her updated medication list for this problem includes:    Sertraline Hcl 100 Mg Tabs (Sertraline hcl) .Marland Kitchen... 1/2 pill a day for 6 days, then 1 a day  Problem # 2:  KNEE PAIN (ICD-719.46) This may be from over-use, but with pt. so excited with weight loss and so motivated don't want to discourage that. Will treat supportively and refer to sports medicine.  Problem # 3:  VAGINAL DISCHARGE (ICD-623.5) REsolved.  Problem # 4:  MORBID OBESITY (ICD-278.01) Again, pt. quite motivated with weight loss and to continue. Has lost 100 pounds in the past.  Complete Medication List: 1)  Hydrochlorothiazide 25 Mg Tabs (Hydrochlorothiazide) .... Take 1 tablet by mouth once a day 2)  Fish Oil Concentrate 1000 Mg Caps (Omega-3 fatty acids) .... Take 1 tablet by mouth once a day 3)  Oyster Calcium 500 Mg Tabs (Oyster shell) .... Take 1 tablet by mouth two times a day 4)  Sertraline Hcl 100 Mg Tabs (Sertraline hcl) .... 1/2 pill a day for 6 days, then 1 a day  Patient Instructions: 1)  Please schedule a follow-up appointment in 2 months. 2)  KEEP UP THE GOOD WORK!!! YOU ARE DOING GREAT!!! 3)  Continue the Zoloft (Sertaline). 4)  We will schedule you to see sports medicine. In the mean time, ice your knee after you have been up on it, and youmight want to try an ace bandage.  Prevention & Chronic Care Immunizations   Influenza vaccine: Fluvax Non-MCR  (03/03/2009)   Influenza vaccine deferral: Deferred  (03/03/2009)    Tetanus booster: Not documented    Pneumococcal vaccine: Not documented  Other Screening   Pap smear: NEGATIVE FOR INTRAEPITHELIAL LESIONS OR MALIGNANCY.  (10/21/2008)   Pap smear  action/deferral: GYN Referral  (03/03/2009)   Pap smear due: 11/2008    Mammogram: ASSESSMENT: Negative - BI-RADS 1^MM DIGITAL SCREENING  (09/30/2008)   Mammogram action/deferral: Ordered  (08/04/2009)   Mammogram due: 10/2008   Smoking status: current  (09/16/2009)   Smoking cessation counseling: yes  (09/16/2009)  Lipids   Total Cholesterol: 232  (08/05/2009)   LDL: 161  (08/05/2009)   LDL Direct: Not documented   HDL: 50  (08/05/2009)   Triglycerides: 104  (08/05/2009)    SGOT (AST): 21  (08/05/2009)   BMP action: Ordered   SGPT (ALT): 20  (08/05/2009)   Alkaline phosphatase: 66  (08/05/2009)   Total bilirubin: 1.2  (08/05/2009)  Hypertension   Last Blood Pressure: 124 / 90  (09/16/2009)   Serum creatinine: 0.76  (08/05/2009)   Serum potassium 3.7  (08/05/2009)  Self-Management Support :  Personal Goals (by the next clinic visit) :      Personal blood pressure goal: 140/90  (03/03/2009)     Personal LDL goal: 130  (03/03/2009)    Hypertension self-management support: Not documented    Lipid self-management support: Not documented

## 2010-07-08 NOTE — Assessment & Plan Note (Signed)
Summary: EST-1 MONTH F/U VISIT/CH   Vital Signs:  Patient profile:   50 year old female Height:      66 inches (167.64 cm) Weight:      270.2 pounds (122.82 kg) BMI:     43.77 Temp:     97.0 degrees F (36.11 degrees C) oral Pulse rate:   64 / minute BP sitting:   124 / 88  (left arm) Cuff size:   large  Vitals Entered By: Cynda Familia Duncan Dull) (April 27, 2010 10:55 AM) CC: pap smear only Is Patient Diabetic? No Pain Assessment Patient in pain? no      Nutritional Status BMI of > 30 = obese  Have you ever been in a relationship where you felt threatened, hurt or afraid?No   Does patient need assistance? Functional Status Self care Ambulation Normal   Primary Care Provider:  Zoila Shutter MD  CC:  pap smear only.  History of Present Illness: 50 yr old who comes in today to get her pap smear done. She is having symptoms of a yeast infection.  She is otherwise doing great. The sertaline is working well. She is working and likes her job. She has lost 5 more pounds.   Depression History:      The patient denies a depressed mood most of the day and a diminished interest in her usual daily activities.         Current Medications (verified): 1)  Hydrochlorothiazide 25 Mg  Tabs (Hydrochlorothiazide) .... Take 1 Tablet By Mouth Once A Day 2)  Fish Oil Concentrate 1000 Mg  Caps (Omega-3 Fatty Acids) .... Take 1 Tablet By Mouth Once A Day 3)  Oyster Calcium 500 Mg  Tabs (Oyster Shell) .... Take 1 Tablet By Mouth Two Times A Day 4)  Sertraline Hcl 100 Mg Tabs (Sertraline Hcl) .Marland Kitchen.. 1 A Day 5)  Nexium 40 Mg Cpdr (Esomeprazole Magnesium) .... One A Day  Allergies (verified): 1)  ! Latex Exam Gloves (Disposable Gloves)  Past History:  Past Medical History: Reviewed history from 08/04/2009 and no changes required. Hyperlipidemia Hypertension Morbid obesity Tobacco use Condylomata acuminata, vaginal and perirectal HPV Traction alopecia, (Dr. 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 Family History Diabetes 1st degree relative - mother and father Family History Hypertension - mother and father Father died of papillary thyroid cancer-1/10  Social History: Reviewed history from 03/09/2010 and no changes required. 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 Single Current Smoker Alcohol use-no Drug use-no Regular exercise-yes - walking  Review of Systems General:  Denies fatigue, malaise, and weakness. CV:  Denies chest pain or discomfort and palpitations. Resp:  Denies cough and shortness of breath. Psych:  Denies depression.  Physical Exam  General:  alert and well-developed.   Head:  normocephalic and atraumatic.   Eyes:  vision grossly intact.   Lungs:  normal respiratory effort.   Heart:  normal rate.   Genitalia:  nl. external genitalia, moderate amount of white thick discharge in the vaginal vault, difficult to visualize cervix because of all of discharge, pap taken from exocervical area, bimanual exam was normal Extremities:  no edema   Impression & Recommendations:  Problem # 1:  ROUTINE GYNECOLOGICAL EXAMINATION (ICD-V72.31) Pap smear done today. I am not sure that I got the best  specimen because of yeast infection.  Problem # 2:  CANDIDIASIS OF VULVA AND VAGINA (ICD-112.1)  Prescription given for diflucan 150mg  to take 1 today, then repeat in 3 days if need.  Her updated medication list for this problem includes:    Fluconazole 150 Mg Tabs (Fluconazole) .Marland Kitchen... Take one today, you may repeat if symptoms of yeast infection persist in 3 days  Problem # 3:  DEPRESSIVE DISORDER (ICD-311) Ms. Mcmichen is doing great. I will see her in follow up in 3 months. The following medications  were removed from the medication list:    Sertraline Hcl 100 Mg Tabs (Sertraline hcl) .Marland Kitchen... 1/2 pill a day for 6 days, then 1 a day Her updated medication list for this problem includes:    Sertraline Hcl 100 Mg Tabs (Sertraline hcl) .Marland Kitchen... 1 a day  Problem # 4:  HYPERTENSION (ICD-401.9) This is well controlled. Continue HCTZ.  Her updated medication list for this problem includes:    Hydrochlorothiazide 25 Mg Tabs (Hydrochlorothiazide) .Marland Kitchen... Take 1 tablet by mouth once a day  Problem # 5:  MORBID OBESITY (ICD-278.01) Ms. Callies continues to do great as far as this. She had already lost >100 pounds, and is continuing to lose weight.  Complete Medication List: 1)  Hydrochlorothiazide 25 Mg Tabs (Hydrochlorothiazide) .... Take 1 tablet by mouth once a day 2)  Fish Oil Concentrate 1000 Mg Caps (Omega-3 fatty acids) .... Take 1 tablet by mouth once a day 3)  Oyster Calcium 500 Mg Tabs (Oyster shell) .... Take 1 tablet by mouth two times a day 4)  Sertraline Hcl 100 Mg Tabs (Sertraline hcl) .Marland Kitchen.. 1 a day 5)  Nexium 40 Mg Cpdr (Esomeprazole magnesium) .... One a day 6)  Fluconazole 150 Mg Tabs (Fluconazole) .... Take one today, you may repeat if symptoms of yeast infection persist in 3 days  Other Orders: T-PAP [facility]) [phone])  Patient Instructions: 1)  Please schedule a follow-up appointment in 3 months. 2)  I will send in Diflucan 150mg  for your yeast infection. You should take one pill today. If it is not gone in 3 days then you can take another pill, or you can wait until after your menses. 3)  HAPPY THANKSGIVING!!! Prescriptions: FLUCONAZOLE 150 MG TABS (FLUCONAZOLE) take one today, you may repeat if symptoms of yeast infection persist in 3 days  #4 x 4   Entered and Authorized by:   Zoila Shutter MD   Signed by:   Zoila Shutter MD on 04/27/2010   Method used:   [facility]        [facility]  [address] [id number]       [address]       [place], [place]  [address]       Ph: [phone] or [phone]       Fax: [phone]   RxID:   [id number]    Orders Added: 1)  T-PAP [facility]) [[id number]]     Prevention & Chronic Care Immunizations   Influenza vaccine: Fluvax 3+  (03/09/2010)   Influenza vaccine deferral: Deferred  (03/03/2009)    Tetanus booster: Not documented    Pneumococcal vaccine: Not documented  Other Screening   Pap smear: NEGATIVE FOR INTRAEPITHELIAL LESIONS OR MALIGNANCY.  (10/21/2008)   Pap smear action/deferral: GYN Referral  (03/03/2009)   Pap smear due: 11/2008    Mammogram: ASSESSMENT: Negative - BI-RADS 1^MM DIGITAL SCREENING  (03/24/2010)  Mammogram action/deferral: Ordered  (08/04/2009)   Mammogram due: 10/2008   Smoking status: current  (03/09/2010)   Smoking cessation counseling: yes  (03/09/2010)  Lipids   Total Cholesterol: 232  (08/05/2009)   LDL: 161  (08/05/2009)   LDL Direct: Not documented   HDL: 50  (08/05/2009)   Triglycerides: 104  (08/05/2009)    SGOT (AST): 21  (08/05/2009)   BMP action: Ordered   SGPT (ALT): 20  (08/05/2009)   Alkaline phosphatase: 66  (08/05/2009)   Total bilirubin: 1.2  (08/05/2009)  Hypertension   Last Blood Pressure: 124 / 88  (04/27/2010)   Serum creatinine: 0.76  (08/05/2009)   Serum potassium 3.7  (08/05/2009)  Self-Management Support :   Personal Goals (by the next clinic visit) :      Personal blood pressure goal: 140/90  (03/03/2009)     Personal LDL goal: 130  (03/03/2009)    Patient will work on the following items until the next clinic visit to reach self-care goals:     Medications and monitoring: take my medicines every day  (04/27/2010)     Eating: eat foods that are low in salt, eat baked foods instead of fried foods  (08/04/2009)     Activity: join a walking program  (08/04/2009)    Hypertension self-management support: Written self-care plan  (04/27/2010)   Hypertension self-care plan printed.    Lipid  self-management support: Written self-care plan  (04/27/2010)   Lipid self-care plan printed.

## 2010-07-08 NOTE — Assessment & Plan Note (Signed)
Summary: COMPLETE PHYSICAL WITH PAP/CFB   Vital Signs:  Patient profile:   50 year old female Weight:      275.4 pounds BMI:     44.61 Pulse rate:   68 / minute BP sitting:   117 / 76  (left arm) Cuff size:   large  Vitals Entered By: Cynda Familia Duncan Dull) (March 09, 2010 11:25 AM) CC: feels bloated after meals Is Patient Diabetic? No Pain Assessment Patient in pain? no      Nutritional Status BMI of > 30 = obese  Does patient need assistance? Functional Status Self care Ambulation Normal   Primary Care Avish Torry:  Zoila Shutter MD  CC:  feels bloated after meals.  History of Present Illness: 50 yr old who comes in for follow up and she was also going to have a health maintence exam today.  However she arrived 30 minutes late for her 30 minute appt.   She is doing great as far as her depression. 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 She has lost 8 more pounds since she was last in! She has discovered that she does not need a significant other to be o.k. She feels great!  She does want a flu shot today.  Ms. Noy does have one new complaint. She says after she eats she feels bloated and will sometines have diarrhea. She will also sometimes have acid reflux symptoms. She denies n/v, constipation associated with this. She has not taken any thing for this. She has never been on a proton pump inhibitor.      Preventive Screening-Counseling & Management  Alcohol-Tobacco     Smoking Status: current     Smoking Cessation Counseling: yes     Packs/Day: 0.5     Year Started: 1970's     Year Quit: 2 weeks  Current Medications (verified): 1)  Hydrochlorothiazide 25 Mg  Tabs (Hydrochlorothiazide) .... Take 1 Tablet By Mouth Once A Day 2)  Fish Oil Concentrate 1000 Mg  Caps (Omega-3 Fatty Acids) .... Take 1 Tablet By Mouth Once A Day 3)  Oyster Calcium 500 Mg  Tabs (Oyster Shell) .... Take 1 Tablet By Mouth Two Times A Day 4)   Sertraline Hcl 100 Mg Tabs (Sertraline Hcl) .... 1/2 Pill A Day For 6 Days, Then 1 A Day 5)  Sertraline Hcl 100 Mg Tabs (Sertraline Hcl) .Marland Kitchen.. 1 A Day 6)  Nexium 40 Mg Cpdr (Esomeprazole Magnesium) .... One A Day  Allergies: 1)  ! Latex Exam Gloves (Disposable Gloves)  Past History:  Past Medical History: Reviewed history from 08/04/2009 and no changes required. 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 Family History Diabetes 1st degree relative - mother and father Family History Hypertension - mother and father Father died of papillary thyroid cancer-1/10  Social History: Reviewed history from 07/11/2008 and no changes required. 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 CV:  Denies chest pain or discomfort and palpitations. Resp:  Denies cough and shortness of breath. GI:  as per HPI.  MS:  Denies joint pain. Psych:  Denies depression and irritability.  Physical Exam  General:  alert and well-developed.   Head:  normocephalic and atraumatic.   Eyes:  vision grossly intact.   Neck:  supple, full ROM, and no masses.   Lungs:  normal respiratory effort and normal breath sounds.   Heart:  normal rate and regular rhythm.   Abdomen:  soft, non-tender, and normal bowel sounds.   Msk:  normal ROM.   Extremities:  no edema   Impression & Recommendations:  Problem # 1:  DEPRESSIVE DISORDER (ICD-311) She is doing great. Continue sertaline. I just  refilled this 03/08/10. Her updated medication list for this problem includes:    Sertraline Hcl 100 Mg Tabs (Sertraline hcl) .Marland Kitchen... 1/2 pill a day for 6 days, then 1 a day    Sertraline Hcl 100 Mg Tabs (Sertraline hcl) .Marland Kitchen... 1 a day  Problem # 2:  HYPERTENSION (ICD-401.9) Well controlled, continue HCTZ. Her updated medication list for this problem includes:    Hydrochlorothiazide 25 Mg Tabs (Hydrochlorothiazide) .Marland Kitchen... Take 1 tablet by mouth once a day  Problem # 3:  GERD (ICD-530.81) I think her symptoms of bloating and occasional diarrhea after eating are from reflux. I will start her on nexium and see how she does. Her updated medication list for this problem includes:    Nexium 40 Mg Cpdr (Esomeprazole magnesium) ..... One a day  Problem # 4:  MORBID OBESITY (ICD-278.01) She continues to lose weight. Encouraged to continue on!  Problem # 5:  Preventive Health Care (ICD-V70.0) Flu shot given today. She will reschedule for pap and health maintenance exam.  Complete Medication List: 1)  Hydrochlorothiazide 25 Mg Tabs (Hydrochlorothiazide) .... Take 1 tablet by mouth once a day 2)  Fish Oil Concentrate 1000 Mg Caps (Omega-3 fatty acids) .... Take 1 tablet by mouth once a day 3)  Oyster Calcium 500 Mg Tabs (Oyster shell) .... Take 1 tablet by mouth two times a day 4)  Sertraline Hcl 100 Mg Tabs (Sertraline hcl) .... 1/2 pill a day for 6 days, then 1 a day 5)  Sertraline Hcl 100 Mg Tabs (Sertraline hcl) .Marland Kitchen.. 1 a day 6)  Nexium 40 Mg Cpdr (Esomeprazole magnesium) .... One a day  Other Orders: Admin 1st Vaccine (78295) Flu Vaccine 73yrs + (62130)  Patient Instructions: 1)  Please schedule a follow-up appointment in 1 month or next available 11:30 AM appt. 2)  I am starting you on Nexium 40mg  to take one a day which should help your stomach symptoms. 3)  KEEP UP ALL THE GREAT WORK, AND REMEMBER YOU ARE ALL YOU NEED!!! Prescriptions: NEXIUM 40 MG CPDR (ESOMEPRAZOLE MAGNESIUM) one a day   #31 x 6   Entered and Authorized by:   Zoila Shutter MD   Signed by:   Zoila Shutter MD on 03/09/2010   Method used:   [facility]  [address] [id number]       [address]       [place], [place]  [address]       Ph: [phone] or [phone]       Fax: [phone]   RxID:   [id number]  Flu Vaccine Consent Questions     Do you have a history of severe allergic reactions to this vaccine? no    Any prior history of allergic reactions to egg and/or gelatin?  no    Do you have a sensitivity to the preservative Thimersol? no    Do you have a past history of Guillan-Barre Syndrome? no    Do you currently have an acute febrile illness? no    Have you ever had a severe reaction to latex? yes    Vaccine information given and explained to patient? yes    Are you currently pregnant? no    Lot Number: [id number]   Exp Date: 12/04/2010   Manufacturer: [employer] (multidose vial)    Site Given  rightDeltoid IM.Cynda Familia [facility])  March 09, 2010 11:44 AM        Ph: [phone] or [phone]       Fax: [phone]   RxID:   [id number]   .opcflu

## 2010-08-16 LAB — STREP A DNA PROBE: Group A Strep Probe: NEGATIVE

## 2010-08-16 LAB — RAPID STREP SCREEN (MED CTR MEBANE ONLY)
Streptococcus, Group A Screen (Direct): NEGATIVE
Streptococcus, Group A Screen (Direct): NEGATIVE

## 2010-09-02 ENCOUNTER — Other Ambulatory Visit: Payer: Self-pay | Admitting: *Deleted

## 2010-09-03 ENCOUNTER — Telehealth: Payer: Self-pay | Admitting: *Deleted

## 2010-09-03 MED ORDER — HYDROCHLOROTHIAZIDE 25 MG PO TABS: 25.0000 mg | ORAL_TABLET | Freq: Every day | ORAL | Status: DC

## 2010-09-03 NOTE — Telephone Encounter (Signed)
Opened by mistake.

## 2010-09-12 LAB — URINALYSIS, ROUTINE W REFLEX MICROSCOPIC
Bilirubin Urine: NEGATIVE
Glucose, UA: NEGATIVE mg/dL
Ketones, ur: NEGATIVE mg/dL
Leukocytes, UA: NEGATIVE
Nitrite: NEGATIVE
Protein, ur: NEGATIVE mg/dL
Specific Gravity, Urine: 1.026 (ref 1.005–1.030)
Urobilinogen, UA: 1 mg/dL (ref 0.0–1.0)
pH: 7 (ref 5.0–8.0)

## 2010-09-12 LAB — POCT I-STAT, CHEM 8
BUN: 13 mg/dL (ref 6–23)
Calcium, Ion: 1.1 mmol/L — ABNORMAL LOW (ref 1.12–1.32)
Chloride: 101 mEq/L (ref 96–112)
Creatinine, Ser: 0.8 mg/dL (ref 0.4–1.2)
Glucose, Bld: 78 mg/dL (ref 70–99)
HCT: 44 % (ref 36.0–46.0)
Hemoglobin: 15 g/dL (ref 12.0–15.0)
Potassium: 3.7 mEq/L (ref 3.5–5.1)
Sodium: 139 mEq/L (ref 135–145)
TCO2: 25 mmol/L (ref 0–100)

## 2010-09-12 LAB — URINE MICROSCOPIC-ADD ON

## 2010-10-18 ENCOUNTER — Other Ambulatory Visit: Payer: Self-pay | Admitting: *Deleted

## 2010-10-18 MED ORDER — SERTRALINE HCL 100 MG PO TABS: 100.0000 mg | ORAL_TABLET | Freq: Every day | ORAL | Status: DC

## 2010-10-18 NOTE — Telephone Encounter (Signed)
Patient overdue for follow up visit. Needs to schedule an appointment.

## 2010-10-22 NOTE — Group Therapy Note (Signed)
Rebekah Howard, Rebekah Howard NO.:  [id number]   MEDICAL RECORD NO.:  [id number]                   PATIENT TYPE:  OUT   LOCATION:  [facility]                           FACILITY:  [facility]   PHYSICIAN:  Tinnie Gens, MD                     DATE OF BIRTH:  Oct 06, 1960   DATE OF SERVICE:  07/24/2003                                    CLINIC NOTE   CHIEF COMPLAINT:  Annual exam, fatigue.   HISTORY OF PRESENT ILLNESS:  The patient is a 50 year old gravida 3 para 3  who is status post tubal ligation.  She has a history of genital warts that  have been treated.  It has been more than a year since her last Pap smear  and she desires to have it done at this time.  She also states that she has  some warts that tend to keep flaring and need retreatment.  Today she is  also complaining of profound fatigue, constipation, cold intolerance.  She  also has abnormal periods where she went several months without having a  period.   PAST MEDICAL HISTORY:  Significant for arthritis.   PAST SURGICAL HISTORY:  Negative.   OBSTETRICAL HISTORY:  She is a G3 P3.   GYNECOLOGICAL HISTORY:  She has irregular menses, medium flow, moderate  pain.  She is status post tubal ligation.  She has never had an abnormal Pap  smear.  Her last mammogram was done in 2004 and was normal.   FAMILY HISTORY:  Diabetes in both parents and hypertension in both parents.   SOCIAL HISTORY:  She is a smoker.  She is an occasional alcohol drinker.  She denies drug use.   REVIEW OF SYSTEMS:  A 14-point review of systems is reviewed and negative  except as mentioned in the HPI.   PHYSICAL EXAMINATION:  VITAL SIGNS:  Her pulse is 78, temperature 97.1,  blood pressure 148/90, weight 383.4.  GENERAL:  She is a markedly-obese black female in no acute distress.  GENITOURINARY:  She has normal external female genitalia with the exception  of kissing lesions consistent with genital warts in the posterior  fourchette  and perianally.  The vagina is rugated.  The cervix is posterior and without  lesion.  The uterus and adnexa could not really be appreciated on bimanual  exam secondary to the patient's body habitus.   IMPRESSION:  1. Annual exam.  2. Fatigue and constipation.  3. Genital warts.   PLAN:  1. Pap smear today.  Set up for mammogram.  2. Treated genital warts with TCA 80% today.  Follow up in 1 week.  3. Check TSH today secondary to her fatigue and abnormal uterine bleeding.  Tinnie Gens, MD    TP/MEDQ  D:  07/24/2003  T:  07/24/2003  Job:  [id number]

## 2010-11-01 ENCOUNTER — Encounter: Payer: Self-pay | Admitting: Internal Medicine

## 2010-11-01 DIAGNOSIS — I10 Essential (primary) hypertension: Secondary | ICD-10-CM

## 2010-11-02 ENCOUNTER — Ambulatory Visit: Payer: Self-pay | Admitting: Internal Medicine

## 2010-11-16 ENCOUNTER — Ambulatory Visit: Payer: Self-pay | Admitting: Internal Medicine

## 2010-12-05 ENCOUNTER — Other Ambulatory Visit: Payer: Self-pay | Admitting: Internal Medicine

## 2010-12-05 DIAGNOSIS — I10 Essential (primary) hypertension: Secondary | ICD-10-CM

## 2010-12-05 NOTE — Telephone Encounter (Signed)
Strongly advised to schedule an OV ASAP. Patient verbalized understanding.

## 2010-12-28 ENCOUNTER — Ambulatory Visit: Payer: Self-pay | Admitting: Internal Medicine

## 2011-01-09 ENCOUNTER — Emergency Department (HOSPITAL_COMMUNITY)
Admission: EM | Admit: 2011-01-09 | Discharge: 2011-01-09 | Disposition: A | Discharge status: Home / Self Care | Payer: Medicaid Other | Attending: Emergency Medicine | Admitting: Emergency Medicine

## 2011-01-09 ENCOUNTER — Emergency Department (HOSPITAL_COMMUNITY): Payer: Medicaid Other

## 2011-01-09 DIAGNOSIS — R062 Wheezing: Secondary | ICD-10-CM | POA: Insufficient documentation

## 2011-01-09 DIAGNOSIS — R05 Cough: Secondary | ICD-10-CM | POA: Insufficient documentation

## 2011-01-09 DIAGNOSIS — E785 Hyperlipidemia, unspecified: Secondary | ICD-10-CM | POA: Insufficient documentation

## 2011-01-09 DIAGNOSIS — R059 Cough, unspecified: Secondary | ICD-10-CM | POA: Insufficient documentation

## 2011-01-09 DIAGNOSIS — J40 Bronchitis, not specified as acute or chronic: Secondary | ICD-10-CM | POA: Insufficient documentation

## 2011-01-09 DIAGNOSIS — Z79899 Other long term (current) drug therapy: Secondary | ICD-10-CM | POA: Insufficient documentation

## 2011-01-09 DIAGNOSIS — R0609 Other forms of dyspnea: Secondary | ICD-10-CM | POA: Insufficient documentation

## 2011-01-09 DIAGNOSIS — R0989 Other specified symptoms and signs involving the circulatory and respiratory systems: Secondary | ICD-10-CM | POA: Insufficient documentation

## 2011-01-09 DIAGNOSIS — F3289 Other specified depressive episodes: Secondary | ICD-10-CM | POA: Insufficient documentation

## 2011-01-09 DIAGNOSIS — R0602 Shortness of breath: Secondary | ICD-10-CM | POA: Insufficient documentation

## 2011-01-09 DIAGNOSIS — I10 Essential (primary) hypertension: Secondary | ICD-10-CM | POA: Insufficient documentation

## 2011-01-09 DIAGNOSIS — F329 Major depressive disorder, single episode, unspecified: Secondary | ICD-10-CM | POA: Insufficient documentation

## 2011-01-09 DIAGNOSIS — J9801 Acute bronchospasm: Secondary | ICD-10-CM | POA: Insufficient documentation

## 2011-01-25 ENCOUNTER — Ambulatory Visit: Payer: Self-pay | Admitting: Internal Medicine

## 2011-02-08 ENCOUNTER — Encounter: Payer: Self-pay | Admitting: Internal Medicine

## 2011-02-08 ENCOUNTER — Ambulatory Visit (INDEPENDENT_AMBULATORY_CARE_PROVIDER_SITE_OTHER): Payer: Medicaid Other | Admitting: Internal Medicine

## 2011-02-08 VITALS — BP 125/90 | HR 83 | Temp 97.3°F | Ht 66.0 in | Wt 302.2 lb

## 2011-02-08 DIAGNOSIS — N898 Other specified noninflammatory disorders of vagina: Secondary | ICD-10-CM | POA: Insufficient documentation

## 2011-02-08 DIAGNOSIS — Z23 Encounter for immunization: Secondary | ICD-10-CM

## 2011-02-08 DIAGNOSIS — Z113 Encounter for screening for infections with a predominantly sexual mode of transmission: Secondary | ICD-10-CM

## 2011-02-08 DIAGNOSIS — R109 Unspecified abdominal pain: Secondary | ICD-10-CM

## 2011-02-08 DIAGNOSIS — F329 Major depressive disorder, single episode, unspecified: Secondary | ICD-10-CM

## 2011-02-08 DIAGNOSIS — I10 Essential (primary) hypertension: Secondary | ICD-10-CM

## 2011-02-08 DIAGNOSIS — F3289 Other specified depressive episodes: Secondary | ICD-10-CM

## 2011-02-08 LAB — POCT URINALYSIS DIPSTICK
Leukocytes, UA: NEGATIVE
Nitrite, UA: NEGATIVE
Protein, UA: NEGATIVE
Urobilinogen, UA: 0.2
pH, UA: 7

## 2011-02-08 MED ORDER — SERTRALINE HCL 100 MG PO TABS: 100.0000 mg | ORAL_TABLET | Freq: Every day | ORAL | Status: DC

## 2011-02-08 MED ORDER — BUPROPION HCL ER (XL) 150 MG PO TB24: 150.0000 mg | ORAL_TABLET | ORAL | Status: DC

## 2011-02-08 MED ORDER — HYDROCHLOROTHIAZIDE 25 MG PO TABS: 25.0000 mg | ORAL_TABLET | Freq: Every day | ORAL | Status: DC

## 2011-02-08 NOTE — Patient Instructions (Signed)
I will see you back in 1 month. I am starting you on Buproprion XL 150mg  one a day. This will help with your mood. It has also helped people quit smoking. We will let you know when all of you labs come back.

## 2011-02-09 ENCOUNTER — Other Ambulatory Visit: Payer: Self-pay | Admitting: Internal Medicine

## 2011-02-09 DIAGNOSIS — R109 Unspecified abdominal pain: Secondary | ICD-10-CM | POA: Insufficient documentation

## 2011-02-09 LAB — WET PREP BY MOLECULAR PROBE: Candida species: NEGATIVE

## 2011-02-09 LAB — URINALYSIS, ROUTINE W REFLEX MICROSCOPIC
Bilirubin Urine: NEGATIVE
Glucose, UA: NEGATIVE mg/dL
Ketones, ur: NEGATIVE mg/dL
Protein, ur: NEGATIVE mg/dL

## 2011-02-09 LAB — GC/CHLAMYDIA PROBE AMP, GENITAL: GC Probe Amp, Genital: NEGATIVE

## 2011-02-09 LAB — HEPATITIS B SURFACE ANTIBODY,QUALITATIVE: Hep B S Ab: NEGATIVE

## 2011-02-09 LAB — URINALYSIS, MICROSCOPIC ONLY

## 2011-02-09 LAB — HEPATITIS C ANTIBODY: HCV Ab: NEGATIVE

## 2011-02-09 MED ORDER — METRONIDAZOLE 500 MG PO TABS: 500.0000 mg | ORAL_TABLET | Freq: Two times a day (BID) | ORAL | Status: AC

## 2011-02-09 NOTE — Assessment & Plan Note (Signed)
All tests checked today.

## 2011-02-09 NOTE — Progress Notes (Signed)
  Subjective:    Patient ID: Rebekah Howard, female    DOB: 25-Apr-1961, 50 y.o.   MRN: [id number]  Abdominal Pain Pertinent negatives include no arthralgias, constipation, diarrhea, dysuria, fever, frequency, hematuria, nausea or vomiting.  Vaginal Pain The patient's primary symptoms include a vaginal discharge. Associated symptoms include abdominal pain. Pertinent negatives include no chills, constipation, diarrhea, dysuria, fever, frequency, hematuria, nausea or vomiting.  50 yr old who comes in 7 months late for follow up. She was to f/up in 2/12 for her situational depressed state. Since she was last in she feels like "the anti-depressant isn't working as well and she needs some/something more." As well she is "back with that guy she got away from." She wants STD testing done. She also c/o vaginal discharge, and specks of red, occasionally when she urinates.   She has also had ill-defined lower abdominal pain for 3-4 months. Her menses are still normal. She has had ovarian cysts before, but this pain doesn't seem to be worse certain times during cycle. Denies n/v, no constipation or diarrhea. No dyspareunia. It is sometimes worse after she eats, but never colicky.   She lost her job that she was so excited about the last time she came in, in 6/12. She would like to quit smoking. She is upset that she has gained weight and was on prednisone for 8 days from the ED for a "respiratory thing." They did not give her antibiotics.    Review of Systems  Constitutional: Positive for unexpected weight change. Negative for fever and chills.  Respiratory: Negative for shortness of breath.   Cardiovascular: Negative for chest pain.  Gastrointestinal: Positive for abdominal pain. Negative for nausea, vomiting, diarrhea and constipation.  Genitourinary: Positive for vaginal discharge and vaginal pain. Negative for dysuria, frequency, hematuria, menstrual problem and dyspareunia.  Musculoskeletal:  Negative for arthralgias.  Psychiatric/Behavioral: Positive for dysphoric mood.       Objective:   Physical Exam  Constitutional: She appears well-developed and well-nourished.  HENT:  Head: Normocephalic and atraumatic.  Neck: No thyromegaly present.  Cardiovascular: Normal rate and regular rhythm.   Pulmonary/Chest: Breath sounds normal.  Abdominal: Bowel sounds are normal. There is tenderness.  Genitourinary: Vaginal discharge found.  Musculoskeletal: She exhibits no edema.  there is mild suprapubic to RLQ tenderness, some clear to white vaginal d/c no cervical motion tenderness        Assessment & Plan:

## 2011-02-09 NOTE — Assessment & Plan Note (Signed)
Patient definitely not doing as well as she was doing. I explained to her that is why I had wanted to see her back sooner. At this point will add Wellbutrin XL 150mg  to the sertaline as these often work well together. This is also a good choice for her as it will not cause weight gain and may help with tobacco cessation. She will follow up in 1 month. She seems to have not made the best life choices at this point which we discussed. Will follow, at this point she does not feel that there is someone at church that she could talk to.

## 2011-02-09 NOTE — Assessment & Plan Note (Signed)
See assessment and plan for vaginal discharge. I am unclear as to the exact etiology of her abdominal pain but I think it may be related the to vaginal discharge. Therefore I am going to treat the vaginal discharge first and if this doesn't cure the abdominal pain will plan transvaginal U/S.

## 2011-02-09 NOTE — Assessment & Plan Note (Signed)
GC/Chlamydia, wet prep checked today. Also urine culture was sent as her pH on U/A was 7.0. I will see if whatever is causing these symptoms takes away abdominal discomfort, if not will schedule an U/S.

## 2011-02-11 NOTE — Assessment & Plan Note (Signed)
Patient has unfortunately gained 30 pounds since she was last in. I did not talk much about this as she was already discouraged by this fact. Hopefully the Wellbutrin will help her mood and help her get back on the path of weight loss as she had lost more than 100 pounds.

## 2011-02-11 NOTE — Assessment & Plan Note (Signed)
Patient's blood pressure is up a bit  today. She did seem stressed today so will follow up on this when she returns in one month.

## 2011-02-15 ENCOUNTER — Ambulatory Visit (INDEPENDENT_AMBULATORY_CARE_PROVIDER_SITE_OTHER): Payer: Medicaid Other | Admitting: Internal Medicine

## 2011-02-15 ENCOUNTER — Telehealth: Payer: Self-pay | Admitting: *Deleted

## 2011-02-15 ENCOUNTER — Ambulatory Visit (HOSPITAL_COMMUNITY)
Admission: RE | Admit: 2011-02-15 | Discharge: 2011-02-15 | Disposition: A | Discharge status: Home / Self Care | Payer: Medicaid Other | Source: Ambulatory Visit | Attending: Internal Medicine | Admitting: Internal Medicine

## 2011-02-15 ENCOUNTER — Encounter: Payer: Self-pay | Admitting: Internal Medicine

## 2011-02-15 DIAGNOSIS — N898 Other specified noninflammatory disorders of vagina: Secondary | ICD-10-CM

## 2011-02-15 DIAGNOSIS — R143 Flatulence: Secondary | ICD-10-CM | POA: Insufficient documentation

## 2011-02-15 DIAGNOSIS — R142 Eructation: Secondary | ICD-10-CM | POA: Insufficient documentation

## 2011-02-15 DIAGNOSIS — R109 Unspecified abdominal pain: Secondary | ICD-10-CM

## 2011-02-15 DIAGNOSIS — R141 Gas pain: Secondary | ICD-10-CM | POA: Insufficient documentation

## 2011-02-15 DIAGNOSIS — R1013 Epigastric pain: Secondary | ICD-10-CM | POA: Insufficient documentation

## 2011-02-15 NOTE — Assessment & Plan Note (Addendum)
She complains of abdominal pain as described in history of present illness and has tenderness to palpation in epigastric region. She says that this pain is different from what she has when she saw Dr. Coralee Pesa. Considering her history of GERD and presentation of pain concerning for pancreatitis, although she does not have any nausea vomiting, I would order a lipase level. After detailed discussion with the patient, she was concerned about her pain and does not want to go back home and come back later if she gets worse. I ordered abdominal x-ray which did not show any obstruction. I did not find order for abdominal series or acute abdomen or abdomen 3 view in Epic, and when I called the lab to change the order to any of the 3, patient already had the x-ray done. If she continues to have pain she might need an upright abdominal x-ray, rule out perforation. I explained her to call the clinic or go to the ER if she has nausea, vomiting, worsening of abdominal pain, diarrhea.

## 2011-02-15 NOTE — Telephone Encounter (Signed)
Pt called stating she was started on 2 new medications ( Wellbutrin and flagyl )9/5 and she started having abd pain on 9/8.  She feels bloated, having heartburn, and gas. Increase pain with eating, some relief with tums. Better today, rates  abd pain 7/10 Also c/o SOB when turning to left or right, this is better today.  Asked pt to come to clinic now for evaluation

## 2011-02-15 NOTE — Progress Notes (Signed)
  Subjective:    Patient ID: Rebekah Howard, female    DOB: 23-Feb-1961, 50 y.o.   MRN: [id number]  HPI Rebekah Howard is a pleasant 50 year old woman with past with history of hypertension, depression, GERD who comes to the clinic with abdominal pain for last 5-6 days. She was seen by Dr. Coralee Pesa on 02/08/2011 for vaginitis and depression. She had a battery of tests for STDs done namely HIV, hep B and hep C which are negative. She was positive for bacterial vaginosis and she was already given prescription for Flagyl 500 mg by mouth twice a day for 7 days. She was also started on Wellbutrin during last visit. She says that since she started taking Flagyl and Wellbutrin, she started having epigastric abdominal pain which is sharp and radiates to her back, severe, 7/10, continuous, aggravated by movement of upper torso, by food. Relieved by nothing. She does feel bloated. Denies any nausea, vomiting, diarrhea or constipation. She has normal bowel movements and has no change in color of stool. She does have a history of GERD but she is not taking Nexium which is in her med list. She says that it did not help her and she stopped taking it long time before. She denies any chest pain, shortness of breath, headache    Review of Systems    as per history of present illness, all other systems reviewed and negative. Objective:   Physical Exam  Constitutional: Vital signs reviewed.  Patient is obese and cooperative with exam. Alert and oriented x3.  Head: Normocephalic and atraumatic Mouth: no erythema or exudates, MMM Eyes: PERRL, EOMI, conjunctivae normal, No scleral icterus.  Neck: Supple, Trachea midline normal ROM Cardiovascular: RRR, S1 normal, S2 normal, no MRG, pulses symmetric and intact bilaterally Pulmonary/Chest: CTAB, no wheezes, rales, or rhonchi Abdominal: Soft. Mildly-distended, bowel sounds are normal, moderately tender to palpation in epigastric region. No significant tenderness in  suprapubic area or other regions  Musculoskeletal: No joint deformities, erythema, or stiffness, ROM full and no nontender Neurological: A&O x3, Strenght is normal and symmetric bilaterally, cranial nerve II-XII are grossly intact, no focal motor deficit, sensory intact to light touch bilaterally.  Skin: Warm, dry and intact. No rash, cyanosis, or clubbing.         Assessment & Plan:

## 2011-02-15 NOTE — Assessment & Plan Note (Signed)
One day of Flagyl remaining. Abdominal pain might be from Flagyl  giving gastric distress.

## 2011-02-15 NOTE — Telephone Encounter (Signed)
OK 

## 2011-02-15 NOTE — Patient Instructions (Signed)
Please keep up with thge appointment with Dr. Coralee Pesa. Please get the X- ray done today. I will call you if the lab test or Xray is abnormal. Please keep on drinking water.

## 2011-02-16 ENCOUNTER — Other Ambulatory Visit: Payer: Self-pay | Admitting: Internal Medicine

## 2011-02-16 DIAGNOSIS — Z1231 Encounter for screening mammogram for malignant neoplasm of breast: Secondary | ICD-10-CM

## 2011-02-16 LAB — LIPASE: Lipase: 27 U/L (ref 0–75)

## 2011-02-17 ENCOUNTER — Other Ambulatory Visit: Payer: Self-pay | Admitting: Internal Medicine

## 2011-02-17 ENCOUNTER — Telehealth: Payer: Self-pay | Admitting: Internal Medicine

## 2011-02-17 MED ORDER — PANTOPRAZOLE SODIUM 40 MG PO TBEC: 40.0000 mg | DELAYED_RELEASE_TABLET | Freq: Every day | ORAL | Status: DC

## 2011-02-17 NOTE — Telephone Encounter (Signed)
I called Rebekah Howard with the abdominal X ray result which was essentially normal. She was feeling better and has no abdominal pain for last 2 days- after she completed Flagyl. I discussed with Dr. Coralee Pesa about her visit and she recommended starting her on Protonix 40 mg daily. I discussed this with Mr. Schrieber, but she says that she does not have any money to get a new prescription and that she would discuss this with Dr. Coralee Pesa during the next visit. Anyways I sent the prescription to her pharmacy and told her that she can pick it up once is possible for her.

## 2011-03-28 ENCOUNTER — Ambulatory Visit (HOSPITAL_COMMUNITY): Payer: Medicaid Other

## 2011-03-29 ENCOUNTER — Encounter: Payer: Medicaid Other | Admitting: Internal Medicine

## 2011-04-15 ENCOUNTER — Ambulatory Visit (HOSPITAL_COMMUNITY)
Admission: RE | Admit: 2011-04-15 | Discharge: 2011-04-15 | Disposition: A | Discharge status: Home / Self Care | Payer: Medicaid Other | Source: Ambulatory Visit | Attending: Internal Medicine | Admitting: Internal Medicine

## 2011-04-15 DIAGNOSIS — Z1231 Encounter for screening mammogram for malignant neoplasm of breast: Secondary | ICD-10-CM | POA: Insufficient documentation

## 2011-05-09 ENCOUNTER — Other Ambulatory Visit: Payer: Self-pay | Admitting: *Deleted

## 2011-05-09 DIAGNOSIS — F329 Major depressive disorder, single episode, unspecified: Secondary | ICD-10-CM

## 2011-05-10 MED ORDER — BUPROPION HCL ER (XL) 150 MG PO TB24
150.0000 mg | ORAL_TABLET | ORAL | Status: DC
Start: 2011-05-09 — End: 2011-06-15

## 2011-05-19 ENCOUNTER — Telehealth: Payer: Self-pay | Admitting: *Deleted

## 2011-05-19 NOTE — Telephone Encounter (Signed)
Pt calls and states she is out of her wellbutrin and zoloft, states she does not have the 6.00 for them, the [facility] not [facility] them. Could you please call her at the [phone]

## 2011-05-20 ENCOUNTER — Telehealth: Payer: Self-pay | Admitting: Licensed Clinical Social Worker

## 2011-05-20 NOTE — Telephone Encounter (Signed)
Per phone call to Rebekah Howard/Triage.  Patient cannot afford copays under Medicaid for Zoloft and Welbutrin.

## 2011-05-24 ENCOUNTER — Telehealth: Payer: Self-pay | Admitting: Licensed Clinical Social Worker

## 2011-05-24 NOTE — Telephone Encounter (Signed)
Triage alerted me to patient without Zoloft and Welbutrin and needing assist w/ copays.  Patient obtains meds thru [facility].  Established that patient has Bupropion/Welbutrin (she did not realize they were same med) and without Zoloft for two weeks.  Gave $3 for Zoloft and also $20 in [place] cards as she is without cash and low on food until first of the month when Disability comes in. Patient said she will try and get ride this PM for pick-up.  Envelope left in cabinet.

## 2011-06-15 ENCOUNTER — Ambulatory Visit (INDEPENDENT_AMBULATORY_CARE_PROVIDER_SITE_OTHER): Payer: Medicaid Other | Admitting: Internal Medicine

## 2011-06-15 ENCOUNTER — Encounter: Payer: Self-pay | Admitting: Internal Medicine

## 2011-06-15 VITALS — BP 138/92 | HR 68 | Temp 97.8°F | Ht 66.0 in | Wt 320.3 lb

## 2011-06-15 DIAGNOSIS — N83209 Unspecified ovarian cyst, unspecified side: Secondary | ICD-10-CM

## 2011-06-15 DIAGNOSIS — E785 Hyperlipidemia, unspecified: Secondary | ICD-10-CM

## 2011-06-15 DIAGNOSIS — K219 Gastro-esophageal reflux disease without esophagitis: Secondary | ICD-10-CM

## 2011-06-15 DIAGNOSIS — I10 Essential (primary) hypertension: Secondary | ICD-10-CM

## 2011-06-15 DIAGNOSIS — R109 Unspecified abdominal pain: Secondary | ICD-10-CM

## 2011-06-15 DIAGNOSIS — F329 Major depressive disorder, single episode, unspecified: Secondary | ICD-10-CM

## 2011-06-15 DIAGNOSIS — Z0000 Encounter for general adult medical examination without abnormal findings: Secondary | ICD-10-CM

## 2011-06-15 DIAGNOSIS — Z23 Encounter for immunization: Secondary | ICD-10-CM

## 2011-06-15 DIAGNOSIS — F3289 Other specified depressive episodes: Secondary | ICD-10-CM

## 2011-06-15 MED ORDER — ZOLPIDEM TARTRATE 10 MG PO TABS: 10.0000 mg | ORAL_TABLET | Freq: Every evening | ORAL | Status: AC | PRN

## 2011-06-15 MED ORDER — PANTOPRAZOLE SODIUM 40 MG PO TBEC: 40.0000 mg | DELAYED_RELEASE_TABLET | Freq: Every day | ORAL | Status: DC

## 2011-06-15 MED ORDER — BUPROPION HCL ER (XL) 150 MG PO TB24: 300.0000 mg | ORAL_TABLET | ORAL | Status: DC

## 2011-06-15 NOTE — Assessment & Plan Note (Signed)
Asked pt to restart protinix and continue for 6 weeks to 3 months, and then discontinue and re-eval sx.  She needs to lose weight.

## 2011-06-15 NOTE — Assessment & Plan Note (Addendum)
Has recently consistently gained weight over the last few months to years. Understands need to eat well and exercise. Checking TSH for organic possibility
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
## 2011-06-15 NOTE — Assessment & Plan Note (Addendum)
No recent lipid panel.  Check non fasting today. Continue omega 3.  She d/c statin in past because abd discomfort

## 2011-06-15 NOTE — Assessment & Plan Note (Signed)
Pain may be due to h/o ovarian cyst seen on [place] in 2008. -Repeat US, though pt understands that even if cyst is larger, we cannot start her on OCP in light of age and smoking history

## 2011-06-15 NOTE — Patient Instructions (Signed)
Today we made some changes to your medications.  We increased Wellbutrin to 300mg  daily.  We also added ambien 10mg  before bed.  Be sure to take ambien right before sleeping.  Finally, please restart protonix for your heart burn.  -Return in 1 month for [place] to recheck you urine  -I have placed a referral for you to get a transvaginal ultrasound.  -Please call the clinic at [phone] if your symptoms worsen.

## 2011-06-15 NOTE — Assessment & Plan Note (Signed)
Urine dip stick revealed blood, but no infection. I do not suspect infection at this time.  Possibly nephrolithiasis.  If pt is becoming irregular due to menses, she may start spotting.  Pain may be due to h/o ovarian cyst seen on [place] in 2008. Plan - return in 1 month to re-eval and recheck UA, urine from today sent for UA with micro; pt to go for ultrasound to evaluate ovarian cyst  She denies dysparunia and bleeding with intercourse, Pap in 2011 was wnl, next due in 2014.  I do not suspect malignancy at this time, but will re-consider at follow up.

## 2011-06-15 NOTE — Assessment & Plan Note (Signed)
Lab Results  Component Value Date   NA 137 08/05/2009   K 3.7 08/05/2009   CL 102 08/05/2009   CO2 23 08/05/2009   BUN 11 08/05/2009   CREATININE 0.76 08/05/2009    BP Readings from Last 3 Encounters:  06/15/11 138/92  02/15/11 122/86  02/08/11 125/90    Assessment: Hypertension control:  mildly elevated  Progress toward goals:  deteriorated Barriers to meeting goals:  no barriers identified  Plan: Hypertension treatment:  continue current medications, monitor, patient likely anxious today about meeting new PCP; checking CMET today

## 2011-06-16 LAB — COMPREHENSIVE METABOLIC PANEL
ALT: 19 U/L (ref 0–35)
CO2: 23 mEq/L (ref 19–32)
Calcium: 9.9 mg/dL (ref 8.4–10.5)
Chloride: 100 mEq/L (ref 96–112)
Sodium: 139 mEq/L (ref 135–145)
Total Protein: 6.8 g/dL (ref 6.0–8.3)

## 2011-06-16 LAB — URINALYSIS, ROUTINE W REFLEX MICROSCOPIC
Bilirubin Urine: NEGATIVE
Leukocytes, UA: NEGATIVE
Specific Gravity, Urine: 1.02 (ref 1.005–1.030)
Urobilinogen, UA: 0.2 mg/dL (ref 0.0–1.0)

## 2011-06-16 LAB — URINALYSIS, MICROSCOPIC ONLY
Crystals: NONE SEEN
Squamous Epithelial / LPF: NONE SEEN

## 2011-06-16 LAB — LIPID PANEL
Cholesterol: 228 mg/dL — ABNORMAL HIGH (ref 0–200)
VLDL: 34 mg/dL (ref 0–40)

## 2011-06-16 LAB — TSH: TSH: 1.694 u[IU]/mL (ref 0.350–4.500)

## 2011-06-16 NOTE — Progress Notes (Signed)
I agree with Dr. Kapadia's assessment and plan. 

## 2011-06-17 ENCOUNTER — Telehealth: Payer: Self-pay | Admitting: *Deleted

## 2011-06-17 NOTE — Telephone Encounter (Signed)
Urine reveals same as when she was seen. U/S results not yet back.

## 2011-06-17 NOTE — Telephone Encounter (Signed)
Pt informed

## 2011-06-17 NOTE — Telephone Encounter (Signed)
Pt called asking for lab and imaging results from 1/9. Lab results in with cholesterol and hematuria being abnormal. I don't see results of U/S  Can you call pt at [phone] with abnormal lab results?

## 2011-07-27 ENCOUNTER — Encounter: Payer: Medicaid Other | Admitting: Internal Medicine

## 2011-08-11 ENCOUNTER — Encounter: Payer: Medicaid Other | Admitting: Internal Medicine

## 2011-08-11 ENCOUNTER — Encounter: Payer: Self-pay | Admitting: Internal Medicine

## 2011-08-11 ENCOUNTER — Ambulatory Visit (INDEPENDENT_AMBULATORY_CARE_PROVIDER_SITE_OTHER): Payer: Medicaid Other | Admitting: Internal Medicine

## 2011-08-11 DIAGNOSIS — L293 Anogenital pruritus, unspecified: Secondary | ICD-10-CM

## 2011-08-11 DIAGNOSIS — R300 Dysuria: Secondary | ICD-10-CM

## 2011-08-11 DIAGNOSIS — Z113 Encounter for screening for infections with a predominantly sexual mode of transmission: Secondary | ICD-10-CM

## 2011-08-11 DIAGNOSIS — N898 Other specified noninflammatory disorders of vagina: Secondary | ICD-10-CM

## 2011-08-11 DIAGNOSIS — N939 Abnormal uterine and vaginal bleeding, unspecified: Secondary | ICD-10-CM

## 2011-08-11 MED ORDER — FLUCONAZOLE 150 MG PO TABS: 150.0000 mg | ORAL_TABLET | Freq: Once | ORAL | Status: AC

## 2011-08-11 NOTE — Assessment & Plan Note (Signed)
Will check HIV Ab as well as urine GC/Chlamydia probe.
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
## 2011-08-11 NOTE — Progress Notes (Signed)
Subjective:     Patient ID: Rebekah Howard, female   DOB: 1960/10/15, 51 y.o.   MRN: [id number]  HPI  Patient reports increased vaginal discharge that is white in color.  She also notes increased itching.  She continues to experience regular monthly menstrual cycles.    She also expresses concern about blood in her urine.  At her last OV, she states she was informed she had blood in her urine and is concerned that her vaginal u/s was not scheduled.      Review of Systems Constitutional: Negative for fever, chills, diaphoresis, activity change, appetite change, fatigue and unexpected weight change.  HENT: Negative for hearing loss, congestion and neck stiffness.   Eyes: Negative for photophobia, pain and visual disturbance.  Respiratory: Negative for cough, chest tightness, shortness of breath and wheezing.   Cardiovascular: Negative for chest pain and palpitations.  Gastrointestinal: Negative for abdominal pain, blood in stool and anal bleeding.  Genitourinary: Negative for dysuria, hematuria and difficulty urinating.  Musculoskeletal: Negative for joint swelling.  Neurological: Negative for dizziness, syncope, speech difficulty, weakness, numbness and headaches.      Objective:   Physical Exam VItal signs reviewed and stable. GEN: No apparent distress.  Alert and oriented x 3.  Pleasant, conversant, and cooperative to exam. HEENT: head is autraumatic and normocephalic.  Neck is supple without palpable masses or lymphadenopathy.  No JVD or carotid bruits.  Vision intact.  EOMI.  PERRLA.  Sclerae anicteric.  Conjunctivae without pallor or injection. Mucous membranes are moist.  Oropharynx is without erythema, exudates, or other abnormal lesions.  RESP:  Lungs are clear to ascultation bilaterally with good air movement.  No wheezes, ronchi, or rubs. CARDIOVASCULAR: regular rate, normal rhythm.  Clear S1, S2, no murmurs, gallops, or rubs. ABDOMEN: soft, non-tender, non-distended.  Bowels  sounds present in all quadrants and normoactive.  No palpable masses. EXT: warm and dry.  Peripheral pulses equal, intact, and +2 globally.  No clubbing or cyanosis.  Trace edema in bilateral lower extremities. SKIN: warm and dry with normal turgor.  No rashes or abnormal lesions observed. NEURO: CN II-XII grossly intact.  Muscle strength +5/5 in bilateral upper and lower extremities.  Sensation is grossly intact.  No focal deficit.     Assessment:

## 2011-08-11 NOTE — Patient Instructions (Addendum)
I will call you with the results of your lab work. Fluconazole is a medicine to treat vaginal yeast infection.  If your symptoms do not improve within 2 days after taking the pill, you may obtain a refill and take a second pill.  If your symptoms persist after a second pill, you need to schedule an appointment for a pelvic exam. Some of the lab work may not be back until early next week. We will set you up for an ultrasound test. Please call the clinic with any concerns or questions.

## 2011-08-11 NOTE — Progress Notes (Signed)
Addended by: Bufford Spikes on: 08/11/2011 10:35 AM   Modules accepted: Orders

## 2011-08-11 NOTE — Assessment & Plan Note (Signed)
Patient was initially concerned about blood in her urine. She reports blood present when she wipes after using the bathroom.  There was no evidence of hematuria on her last two urinalyses; it seems unlikely that her symptoms are the result of hematuria and are more likely the result of abnormal vaginal bleeding.  Will repeat urinalysis again today to confirm lack of blood in urine.  Will also ensure patient is scheduled for vaginal ultrasound to assess for fibroids or other abnormality that could be contributing to her symptoms.  She also notes vaginal discharge consistent with vaginal candidiasis; will treat as outlined below.  Patient's last pelvic exam and Pap in 2011 were normal; she is not due for repeat exam in 2014 however if her symptoms persist and workup is unrevealing, would proceed with pelvic exam and Pap smear now and not defer until 2014.

## 2011-08-12 ENCOUNTER — Telehealth: Payer: Self-pay | Admitting: Internal Medicine

## 2011-08-12 LAB — URINALYSIS, ROUTINE W REFLEX MICROSCOPIC
Bilirubin Urine: NEGATIVE
Glucose, UA: NEGATIVE mg/dL
Ketones, ur: NEGATIVE mg/dL
Specific Gravity, Urine: 1.025 (ref 1.005–1.030)
Urobilinogen, UA: 1 mg/dL (ref 0.0–1.0)
pH: 7 (ref 5.0–8.0)

## 2011-08-12 LAB — URINALYSIS, MICROSCOPIC ONLY
Casts: NONE SEEN
Crystals: NONE SEEN

## 2011-08-12 NOTE — Telephone Encounter (Signed)
Called patient to discuss results of recent lab work.  Message left asking pt to return call to discuss results.  Will attempt to call her back later

## 2011-08-12 NOTE — Telephone Encounter (Signed)
Pt returned call to discuss result.  Informed pt of negative HIV and u/a result that revealed 3-6 RBCs.  Urine gc/chlamydia still pending.  Pt scheduled for vaginal u/s to eval abnormal vaginal bleeding.  Will have her return for repeat u./a

## 2011-08-16 ENCOUNTER — Other Ambulatory Visit (HOSPITAL_COMMUNITY): Payer: Medicaid Other

## 2011-08-17 ENCOUNTER — Ambulatory Visit (HOSPITAL_COMMUNITY)
Admission: RE | Admit: 2011-08-17 | Discharge: 2011-08-17 | Disposition: A | Discharge status: Home / Self Care | Payer: Medicaid Other | Source: Ambulatory Visit | Attending: Internal Medicine | Admitting: Internal Medicine

## 2011-08-17 ENCOUNTER — Other Ambulatory Visit (HOSPITAL_COMMUNITY): Payer: Medicaid Other

## 2011-08-17 DIAGNOSIS — N898 Other specified noninflammatory disorders of vagina: Secondary | ICD-10-CM | POA: Insufficient documentation

## 2011-08-17 DIAGNOSIS — N7013 Chronic salpingitis and oophoritis: Secondary | ICD-10-CM | POA: Insufficient documentation

## 2011-08-17 DIAGNOSIS — N939 Abnormal uterine and vaginal bleeding, unspecified: Secondary | ICD-10-CM

## 2011-08-31 ENCOUNTER — Encounter: Payer: Medicaid Other | Admitting: Internal Medicine

## 2011-10-10 ENCOUNTER — Encounter: Payer: Medicaid Other | Admitting: Internal Medicine

## 2011-10-12 ENCOUNTER — Ambulatory Visit (INDEPENDENT_AMBULATORY_CARE_PROVIDER_SITE_OTHER): Payer: Medicaid Other | Admitting: Internal Medicine

## 2011-10-12 ENCOUNTER — Encounter: Payer: Self-pay | Admitting: Internal Medicine

## 2011-10-12 ENCOUNTER — Encounter: Payer: Self-pay | Admitting: *Deleted

## 2011-10-12 VITALS — BP 126/85 | HR 61 | Temp 96.8°F | Ht 66.0 in | Wt 331.2 lb

## 2011-10-12 DIAGNOSIS — Z1211 Encounter for screening for malignant neoplasm of colon: Secondary | ICD-10-CM | POA: Insufficient documentation

## 2011-10-12 DIAGNOSIS — M67432 Ganglion, left wrist: Secondary | ICD-10-CM

## 2011-10-12 DIAGNOSIS — F329 Major depressive disorder, single episode, unspecified: Secondary | ICD-10-CM

## 2011-10-12 DIAGNOSIS — N898 Other specified noninflammatory disorders of vagina: Secondary | ICD-10-CM

## 2011-10-12 DIAGNOSIS — M6740 Ganglion, unspecified site: Secondary | ICD-10-CM

## 2011-10-12 DIAGNOSIS — N939 Abnormal uterine and vaginal bleeding, unspecified: Secondary | ICD-10-CM

## 2011-10-12 DIAGNOSIS — I10 Essential (primary) hypertension: Secondary | ICD-10-CM

## 2011-10-12 DIAGNOSIS — M67431 Ganglion, right wrist: Secondary | ICD-10-CM | POA: Insufficient documentation

## 2011-10-12 MED ORDER — HYDROCHLOROTHIAZIDE 25 MG PO TABS: 25.0000 mg | ORAL_TABLET | Freq: Every day | ORAL | Status: DC

## 2011-10-12 MED ORDER — SERTRALINE HCL 100 MG PO TABS: 100.0000 mg | ORAL_TABLET | Freq: Every day | ORAL | Status: DC

## 2011-10-12 MED ORDER — BUPROPION HCL ER (XL) 150 MG PO TB24: 300.0000 mg | ORAL_TABLET | ORAL | Status: DC

## 2011-10-12 NOTE — Progress Notes (Signed)
Patient ID: Rebekah Howard, female   DOB: 03/22/1961, 51 y.o.   MRN: [id number] Subjective:     Patient ID: Rebekah Howard, female   DOB: [birth year], 51 y.o.   MRN: [id number]  HPI  patient is here today for routine followup. She describes persistent heavy monthly vaginal bleeding associated with hot sweats and temperature intolerance. Her recent pelvic and transvaginal ultrasound was unrevealing for evidence of uterine fibroid or abnormal mass.  She reports a finding of a bump on her right wrist. She describes this is nonpainful, and nonirritating. She first noticed it one week ago. She does not describe any associated, alleviating or aggravating factors..    She has no other concerns or complaints today.    Review of Systems Constitutional: Negative for fever, chills, diaphoresis, activity change, appetite change, fatigue and unexpected weight change.  HENT: Negative for hearing loss, congestion and neck stiffness.   Eyes: Negative for photophobia, pain and visual disturbance.  Respiratory: Negative for cough, chest tightness, shortness of breath and wheezing.   Cardiovascular: Negative for chest pain and palpitations.  Gastrointestinal: Negative for abdominal pain, blood in stool and anal bleeding.  Genitourinary: Negative for dysuria, hematuria and difficulty urinating.  Musculoskeletal: Negative for joint swelling.  Neurological: Negative for dizziness, syncope, speech difficulty, weakness, numbness and headaches.      Objective:   Physical Exam VItal signs reviewed and stable. GEN: No apparent distress.  Alert and oriented x 3.  Pleasant, conversant, and cooperative to exam. HEENT: head is autraumatic and normocephalic.  Neck is supple without palpable masses or lymphadenopathy.  No JVD or carotid bruits.  Vision intact.  EOMI.  PERRLA.  Sclerae anicteric.  Conjunctivae without pallor or injection. Mucous membranes are moist.  Oropharynx is without erythema, exudates, or  other abnormal lesions.  RESP:  Lungs are clear to ascultation bilaterally with good air movement.  No wheezes, ronchi, or rubs. CARDIOVASCULAR: regular rate, normal rhythm.  Clear S1, S2, no murmurs, gallops, or rubs. ABDOMEN: soft, non-tender, non-distended.  Bowels sounds present in all quadrants and normoactive.  No palpable masses. EXT: warm and dry.  Peripheral pulses equal, intact, and +2 globally.  No clubbing or cyanosis.  No edema in bilateral lower extremities.  Approximately 1 x 1 cm, well-circumscribed, subcutaneous nodule that is freely mobile on patient's right wrist extensor surface. It is nontender to palpation, nonerythematous, not ulcerated and is consistent with a ganglion cyst. SKIN: warm and dry with normal turgor.  No rashes or abnormal lesions observed. NEURO: CN II-XII grossly intact.  Muscle strength +5/5 in bilateral upper and lower extremities.  Sensation is grossly intact.  No focal deficit.     Assessment:

## 2011-10-12 NOTE — Assessment & Plan Note (Signed)
Blood pressure is very well-controlled and within normal limits today. We'll continue HCTZ.

## 2011-10-12 NOTE — Assessment & Plan Note (Signed)
Patient reports continued abnormal vaginal bleeding. She describes associated hot flashes and sweats. She denies any abnormal pain. Pelvic and transvaginal ultrasound were unrevealing for for fibroids or any uterine or ovarian mass.  Will refer patient to gynecology for further evaluation and possible treatment.  Her TSH was within normal limits in January 2013

## 2011-10-12 NOTE — Patient Instructions (Signed)
Schedule a follow up appointment with your primary care provider in one to 2 months. We will refer you to the gastroenterologist to be screened for colon cancer. Will refer you to gynecology to be evaluated for your continued abnormal bleeding. Please send [place] the documentation of your Social Security disability paperwork Keep taking your medications as directed. Call the clinic at [phone] with any concerns or questions.

## 2011-10-12 NOTE — Assessment & Plan Note (Addendum)
Patient has a ganglion cyst on her right wrist.  She is currently asymptomatic. Discussed the option of proceeding with surgical intervention vs continued observation. Patient expresses her desire to proceed with continued observation at this time. Will followup on this and refer her to surgery if her ganglion cyst grows in size and or become symptomatic

## 2011-10-12 NOTE — Assessment & Plan Note (Signed)
Discussed risks and benefits of proceeding with colonoscopy to screen for colon cancer.  Patient states she wishes to proceed with colonoscopy. We'll refer her to gastroenterology today.  She does not describe any bright red blood per rectum, dark tarry stools, abdominal pain, or change in bowel habits

## 2011-10-14 ENCOUNTER — Other Ambulatory Visit: Payer: Self-pay | Admitting: Internal Medicine

## 2011-10-21 ENCOUNTER — Ambulatory Visit (AMBULATORY_SURGERY_CENTER): Payer: Medicaid Other

## 2011-10-21 VITALS — Ht 66.0 in | Wt 325.0 lb

## 2011-10-21 DIAGNOSIS — Z1211 Encounter for screening for malignant neoplasm of colon: Secondary | ICD-10-CM

## 2011-11-11 ENCOUNTER — Encounter: Payer: Medicaid Other | Admitting: Obstetrics and Gynecology

## 2011-11-15 ENCOUNTER — Encounter: Payer: Self-pay | Admitting: Internal Medicine

## 2011-11-15 ENCOUNTER — Ambulatory Visit (AMBULATORY_SURGERY_CENTER): Payer: Medicaid Other | Admitting: Internal Medicine

## 2011-11-15 VITALS — BP 115/79 | HR 68 | Temp 98.1°F | Resp 32 | Ht 66.0 in | Wt 325.0 lb

## 2011-11-15 DIAGNOSIS — Z1211 Encounter for screening for malignant neoplasm of colon: Secondary | ICD-10-CM

## 2011-11-15 MED ORDER — SODIUM CHLORIDE 0.9 % IV SOLN: 500.0000 mL | INTRAVENOUS | Status: DC

## 2011-11-15 NOTE — Progress Notes (Signed)
Patient did not experience any of the following events: a burn prior to discharge; a fall within the facility; wrong site/side/patient/procedure/implant event; or a hospital transfer or hospital admission upon discharge from the facility. (G8907) Patient did not have preoperative order for IV antibiotic SSI prophylaxis. (G8918)  

## 2011-11-15 NOTE — Patient Instructions (Signed)
YOU HAD AN ENDOSCOPIC PROCEDURE TODAY AT THE [facility]: Refer to the procedure report that was given to you for any specific questions about what was found during the examination.  If the procedure report does not answer your questions, please call your gastroenterologist to clarify.  If you requested that your care partner not be given the details of your procedure findings, then the procedure report has been included in a sealed envelope for you to review at your convenience later.  YOU SHOULD EXPECT: Some feelings of bloating in the abdomen. Passage of more gas than usual.  Walking can help get rid of the air that was put into your GI tract during the procedure and reduce the bloating. If you had a lower endoscopy (such as a colonoscopy or flexible sigmoidoscopy) you may notice spotting of blood in your stool or on the toilet paper. If you underwent a bowel prep for your procedure, then you may not have a normal bowel movement for a few days.  DIET: Your first meal following the procedure should be a light meal and then it is ok to progress to your normal diet.  A half-sandwich or bowl of soup is an example of a good first meal.  Heavy or fried foods are harder to digest and may make you feel nauseous or bloated.  Likewise meals heavy in dairy and vegetables can cause extra gas to form and this can also increase the bloating.  Drink plenty of fluids but you should avoid alcoholic beverages for 24 hours.  ACTIVITY: Your care partner should take you home directly after the procedure.  You should plan to take it easy, moving slowly for the rest of the day.  You can resume normal activity the day after the procedure however you should NOT DRIVE or use heavy machinery for 24 hours (because of the sedation medicines used during the test).    SYMPTOMS TO REPORT IMMEDIATELY: A gastroenterologist can be reached at any hour.  During normal business hours, 8:30 AM to 5:00 PM Monday through Friday,  call [phone].  After hours and on weekends, please call the GI answering service at [phone] who will take a message and have the physician on call contact you.   Following lower endoscopy (colonoscopy or flexible sigmoidoscopy):  Excessive amounts of blood in the stool  Significant tenderness or worsening of abdominal pains  Swelling of the abdomen that is new, acute  Fever of 100F or higher   FOLLOW UP: If any biopsies were taken you will be contacted by phone or by letter within the next 1-3 weeks.  Call your gastroenterologist if you have not heard about the biopsies in 3 weeks.  Our staff will call the home number listed on your records the next business day following your procedure to check on you and address any questions or concerns that you may have at that time regarding the information given to you following your procedure. This is a courtesy call and so if there is no answer at the home number and we have not heard from you through the emergency physician on call, we will assume that you have returned to your regular daily activities without incident.  SIGNATURES/CONFIDENTIALITY: You and/or your care partner have signed paperwork which will be entered into your electronic medical record.  These signatures attest to the fact that that the information above on your After Visit Summary has been reviewed and is understood.  Full responsibility of the confidentiality of   this discharge information lies with you and/or your care-partner.   Resume medications. Information on diverticulosis and high fiber diet given with discharge instructions. 

## 2011-11-15 NOTE — Op Note (Signed)
[facility] [address] [place], [place]  [address]  COLONOSCOPY PROCEDURE REPORT  PATIENT:  Rebekah Howard, Rebekah Howard  MR#:  [id number] BIRTHDATE:  1960/12/02, 50 yrs. old  GENDER:  female ENDOSCOPIST:  Carie Caddy. Renata Gambino, MD REF. BY:  Verdene Rio, M.D. PROCEDURE DATE:  11/15/2011 PROCEDURE:  Colonoscopy 66063 ASA CLASS:  Class II INDICATIONS:  Routine Risk Screening MEDICATIONS:   MAC sedation, administered by CRNA, propofol (Diprivan) 300 mg IV  DESCRIPTION OF PROCEDURE:   After the risks benefits and alternatives of the procedure were thoroughly explained, informed consent was obtained.  Digital rectal exam was performed and revealed perianal skin tags.   The LB [id number] endoscope was introduced through the anus and advanced to the cecum, which was identified by both the appendix and ileocecal valve, without limitations.  The quality of the prep was good, using MoviPrep. The instrument was then slowly withdrawn as the colon was fully examined. <<PROCEDUREIMAGES>>  FINDINGS:  Mild diverticulosis was found in the left colon. Otherwise normal colonoscopy without other polyps, masses, vascular ectasias, or inflammatory changes.   Retroflexed views in the rectum revealed no abnormalities.  The scope was then withdrawn from the cecum and the procedure completed.  COMPLICATIONS:  None  ENDOSCOPIC IMPRESSION: 1) Mild diverticulosis in the left colon 2) Otherwise normal colonoscopy  RECOMMENDATIONS: 1) High fiber diet. 2) You should continue to follow colorectal cancer screening guidelines for "routine risk" patients with a repeat colonoscopy in 10 years. There is no need for FOBT (stool) testing for at least 5 years.  Carie Caddy. Rhea Belton, MD  CC:  Nelda Bucks MD The Patient  n. eSIGNED:   Carie Caddy. Pilot Prindle at 11/15/2011 10:49 AM  Marlow Baars, [phone]

## 2011-11-15 NOTE — Progress Notes (Signed)
The pt tolerated the colonoscopy very well. Maw   

## 2011-11-16 ENCOUNTER — Telehealth: Payer: Self-pay

## 2011-11-16 NOTE — Telephone Encounter (Signed)
Left a message for the pt to call if any questions or concerns. Maw

## 2011-11-18 ENCOUNTER — Encounter: Payer: Medicaid Other | Admitting: Obstetrics and Gynecology

## 2011-12-20 ENCOUNTER — Telehealth: Payer: Self-pay | Admitting: *Deleted

## 2011-12-20 NOTE — Telephone Encounter (Signed)
ERROR

## 2011-12-22 ENCOUNTER — Encounter (HOSPITAL_COMMUNITY): Payer: Self-pay | Admitting: Emergency Medicine

## 2011-12-22 ENCOUNTER — Emergency Department (HOSPITAL_COMMUNITY)
Admission: EM | Admit: 2011-12-22 | Discharge: 2011-12-22 | Disposition: A | Discharge status: Home Health | Payer: Medicaid Other | Attending: Emergency Medicine | Admitting: Emergency Medicine

## 2011-12-22 DIAGNOSIS — M1990 Unspecified osteoarthritis, unspecified site: Secondary | ICD-10-CM | POA: Insufficient documentation

## 2011-12-22 DIAGNOSIS — E785 Hyperlipidemia, unspecified: Secondary | ICD-10-CM | POA: Insufficient documentation

## 2011-12-22 DIAGNOSIS — F17200 Nicotine dependence, unspecified, uncomplicated: Secondary | ICD-10-CM | POA: Insufficient documentation

## 2011-12-22 DIAGNOSIS — I10 Essential (primary) hypertension: Secondary | ICD-10-CM | POA: Insufficient documentation

## 2011-12-22 DIAGNOSIS — Z4802 Encounter for removal of sutures: Secondary | ICD-10-CM | POA: Insufficient documentation

## 2011-12-22 NOTE — ED Notes (Signed)
Pt here for suture removal from left leg
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
## 2012-01-03 ENCOUNTER — Ambulatory Visit (HOSPITAL_COMMUNITY)
Admission: RE | Admit: 2012-01-03 | Discharge: 2012-01-03 | Disposition: A | Discharge status: Home / Self Care | Payer: Medicaid Other | Source: Ambulatory Visit | Attending: Internal Medicine | Admitting: Internal Medicine

## 2012-01-03 ENCOUNTER — Encounter: Payer: Self-pay | Admitting: Internal Medicine

## 2012-01-03 ENCOUNTER — Ambulatory Visit (INDEPENDENT_AMBULATORY_CARE_PROVIDER_SITE_OTHER): Payer: Medicaid Other | Admitting: Internal Medicine

## 2012-01-03 VITALS — BP 125/82 | HR 78 | Temp 97.3°F | Ht 66.0 in | Wt 322.1 lb

## 2012-01-03 DIAGNOSIS — N951 Menopausal and female climacteric states: Secondary | ICD-10-CM

## 2012-01-03 DIAGNOSIS — K219 Gastro-esophageal reflux disease without esophagitis: Secondary | ICD-10-CM

## 2012-01-03 DIAGNOSIS — M25569 Pain in unspecified knee: Secondary | ICD-10-CM | POA: Insufficient documentation

## 2012-01-03 DIAGNOSIS — M1710 Unilateral primary osteoarthritis, unspecified knee: Secondary | ICD-10-CM | POA: Insufficient documentation

## 2012-01-03 DIAGNOSIS — IMO0002 Reserved for concepts with insufficient information to code with codable children: Secondary | ICD-10-CM | POA: Insufficient documentation

## 2012-01-03 DIAGNOSIS — Z79899 Other long term (current) drug therapy: Secondary | ICD-10-CM

## 2012-01-03 DIAGNOSIS — M25561 Pain in right knee: Secondary | ICD-10-CM

## 2012-01-03 DIAGNOSIS — W19XXXA Unspecified fall, initial encounter: Secondary | ICD-10-CM | POA: Insufficient documentation

## 2012-01-03 DIAGNOSIS — W01119A Fall on same level from slipping, tripping and stumbling with subsequent striking against unspecified sharp object, initial encounter: Secondary | ICD-10-CM

## 2012-01-03 DIAGNOSIS — W108XXA Fall (on) (from) other stairs and steps, initial encounter: Secondary | ICD-10-CM

## 2012-01-03 DIAGNOSIS — N83209 Unspecified ovarian cyst, unspecified side: Secondary | ICD-10-CM

## 2012-01-03 DIAGNOSIS — S81009A Unspecified open wound, unspecified knee, initial encounter: Secondary | ICD-10-CM | POA: Insufficient documentation

## 2012-01-03 DIAGNOSIS — I10 Essential (primary) hypertension: Secondary | ICD-10-CM

## 2012-01-03 DIAGNOSIS — M79609 Pain in unspecified limb: Secondary | ICD-10-CM | POA: Insufficient documentation

## 2012-01-03 DIAGNOSIS — N939 Abnormal uterine and vaginal bleeding, unspecified: Secondary | ICD-10-CM

## 2012-01-03 DIAGNOSIS — M79672 Pain in left foot: Secondary | ICD-10-CM

## 2012-01-03 DIAGNOSIS — F329 Major depressive disorder, single episode, unspecified: Secondary | ICD-10-CM

## 2012-01-03 DIAGNOSIS — M1120 Other chondrocalcinosis, unspecified site: Secondary | ICD-10-CM | POA: Insufficient documentation

## 2012-01-03 DIAGNOSIS — M1990 Unspecified osteoarthritis, unspecified site: Secondary | ICD-10-CM

## 2012-01-03 DIAGNOSIS — F17200 Nicotine dependence, unspecified, uncomplicated: Secondary | ICD-10-CM

## 2012-01-03 DIAGNOSIS — E785 Hyperlipidemia, unspecified: Secondary | ICD-10-CM

## 2012-01-03 DIAGNOSIS — M19079 Primary osteoarthritis, unspecified ankle and foot: Secondary | ICD-10-CM | POA: Insufficient documentation

## 2012-01-03 LAB — COMPLETE METABOLIC PANEL WITH GFR
ALT: 19 U/L (ref 0–35)
BUN: 13 mg/dL (ref 6–23)
CO2: 26 mEq/L (ref 19–32)
Creat: 0.81 mg/dL (ref 0.50–1.10)
GFR, Est African American: >89 mL/min
Total Bilirubin: 0.6 mg/dL (ref 0.3–1.2)

## 2012-01-03 LAB — CBC
Hemoglobin: 14.2 g/dL (ref 12.0–15.0)
MCH: 29.4 pg (ref 26.0–34.0)
Platelets: 327 10*3/uL (ref 150–400)
RBC: 4.83 MIL/uL (ref 3.87–5.11)
WBC: 5.7 10*3/uL (ref 4.0–10.5)

## 2012-01-03 NOTE — Patient Instructions (Addendum)
--  Please have your left foot and knee X-rays done.  --Follow up with [place] in 1 week.

## 2012-01-05 ENCOUNTER — Telehealth: Payer: Self-pay | Admitting: Licensed Clinical Social Worker

## 2012-01-05 DIAGNOSIS — M25561 Pain in right knee: Secondary | ICD-10-CM | POA: Insufficient documentation

## 2012-01-05 DIAGNOSIS — M25562 Pain in left knee: Secondary | ICD-10-CM | POA: Insufficient documentation

## 2012-01-05 DIAGNOSIS — N951 Menopausal and female climacteric states: Secondary | ICD-10-CM | POA: Insufficient documentation

## 2012-01-05 DIAGNOSIS — M79672 Pain in left foot: Secondary | ICD-10-CM | POA: Insufficient documentation

## 2012-01-05 DIAGNOSIS — W01119A Fall on same level from slipping, tripping and stumbling with subsequent striking against unspecified sharp object, initial encounter: Secondary | ICD-10-CM | POA: Insufficient documentation

## 2012-01-05 NOTE — Assessment & Plan Note (Addendum)
Pt counseled briefly on need for smoking cessation. Pt on Wellbutrin to help her quit. Would readdress this problem during her next visit.

## 2012-01-05 NOTE — Assessment & Plan Note (Signed)
Well controlled on HCTZ 25mg  once daily.

## 2012-01-05 NOTE — Assessment & Plan Note (Signed)
No complaints today.

## 2012-01-05 NOTE — Assessment & Plan Note (Addendum)
Pt has gained some weight steadily in the last year. She is currently interested in bariatric surgery.  --Pt given bariatric surgery/weigh loss interest group pamphlet.  --Follow up on weight loss plan during her next visit. --She has had glucose intolerance in the past. Will check blood glucose today.  --Referral to diabetes educator for weight loss diet plan.

## 2012-01-05 NOTE — Assessment & Plan Note (Addendum)
Left foot Xray for fracture r/o Pt to continue taking Tylenol for foot pain Pt to return to clinic in 1 week    Addendum:   L foot Xray with no fracture but OA changes. Pt will need more OA information and appropriate pain medication during next visit.

## 2012-01-05 NOTE — Progress Notes (Signed)
Subjective:    Patient ID: Rebekah Howard, female    DOB: 1961/02/10, 51 y.o.   MRN: [id number]  HPI Ms. Cieslewicz is a pleasant 51 year-old woman with PMH significant for morbid obesity, HTN, and osteoarthritis, and tobacco abuse who comes in today for evaluation of bilateral knee pain and left foot pain secondary to a fall on 12/14/11. She was visiting her aunt in [place] on 12/14/11 when she stepped through some rotten woodened steps at her relatives's house and fell. She also injured her left knee with a skin puncture caused by rusty nails surrounding the steps, hitting her left foot as she fell and scraping her right anterior shin. Per her reports, she was evaluated and treated at the ED at [facility] in [place]. At the ED they sutured her left knee wounds, gave her a tetanus vaccine booster, cleaned her wounds, ordered X-rays which were negative for fractures, and discharged her home with doxycycline 100mg  BID x 10 days.   She finished her 10 days antibiotic course. She denies fever, chills, N/V/D, oozing or pain at the suture or wounds but reports continued bilateral knee pain and left foot point tenderness. Her foot pain is worse with weight bearing and her knee pain is described as a constant ache.She denies knee swelling, erythema, edema, locking, popping, or clicking. She describes the pain as worsening ache on top of her baseline OA pain with intensity of 8/10 when with increased activity that has not responded well to Tylenol BID.   She information about weight loss and bariatric surgery. She also wanted help with disability forms.    Review of Systems  Constitutional: Negative for fever, chills, diaphoresis, activity change, appetite change, fatigue and unexpected weight change.       Hot flashes for 3-4 months now.   Respiratory: Negative for cough and shortness of breath.   Cardiovascular: Negative for chest pain.  Gastrointestinal: Negative for vomiting and  abdominal pain.  Genitourinary: Negative for difficulty urinating.  Musculoskeletal: Positive for arthralgias and gait problem.  Skin: Positive for wound. Negative for color change and rash.  Neurological: Negative for dizziness, speech difficulty, weakness, light-headedness and headaches.  Psychiatric/Behavioral: Negative for agitation.       Objective:   Physical Exam  Constitutional: She is oriented to person, place, and time. She appears well-developed. No distress.       Morbidly obese  HENT:  Head: Normocephalic and atraumatic.  Eyes: Conjunctivae are normal. Right eye exhibits no discharge. Left eye exhibits no discharge. No scleral icterus.  Cardiovascular: Normal rate, regular rhythm, normal heart sounds and intact distal pulses.  Exam reveals no gallop and no friction rub.   No murmur heard. Pulmonary/Chest: Effort normal and breath sounds normal. No respiratory distress. She has no wheezes. She has no rales. She exhibits no tenderness.  Musculoskeletal: Normal range of motion. She exhibits tenderness. She exhibits no edema.       Bilateral knee pain with full extension and flexion. No joint edema, erythema, or excessive warmth. Left foot with  dorsal bruise and point tenderness that is worse with dorsiflexion, plantarflexion, and weight bearing.   Neurological: She is alert and oriented to person, place, and time. She has normal reflexes.  Skin: Skin is warm and dry. She is not diaphoretic. No erythema.       2 linear wounds parallel to each other over the lateral left knee. Medial wound approximately 2 inches in length, healing well with dry scab, no edema, or erythema.  More lateral wound approximately 3 inches in length with dry scab and no edema or erythema.    Psychiatric: She has a normal mood and affect. Her behavior is normal.          Assessment & Plan:

## 2012-01-05 NOTE — Assessment & Plan Note (Addendum)
--  Ordered bilateral knee Xrays to eval fxs and OA --If no fxs, will address plan for OA management, pt with poor pain control. Will consider starting Naproxen with PPI prophylaxis.  Addendum:   Knee Xrays with no fractures but OA. Pt will need OA information, medication for long term OA pain and possible orthopedic surgery referral if symptoms are worsening.

## 2012-01-05 NOTE — Telephone Encounter (Signed)
Ms. Broden states she is already received federal disability and just needs a school form completed.  CSW informed Ms. Elsayed to insure her name and DOB is on all documentation submitted to our office so that there is no miscommunication.  CSW encouraged pt to keep copies of needed forms.  Pt denies add'l need from this CSW, CSW will sign off.

## 2012-01-05 NOTE — Assessment & Plan Note (Signed)
Pt reports no acid reflux recently, not on PPI.

## 2012-01-05 NOTE — Assessment & Plan Note (Signed)
No complaints today. Pt reports having regular menstrual cycles still but hot flashes for last 3-4 months.

## 2012-01-05 NOTE — Telephone Encounter (Signed)
Rebekah Howard was referred to CSW to obtain additional information on applying for Disability.  CSW placed called to pt.  CSW left message requesting return call. CSW provided contact hours and phone number.

## 2012-01-05 NOTE — Assessment & Plan Note (Signed)
Will continue to monitor symptoms and address concerns.

## 2012-01-05 NOTE — Assessment & Plan Note (Signed)
Pt with worsening OA pain of bilateral knee now s/p fall on 12/14/11. No radiologic record of knee OA. Pt is taking Tylenol PM, 2 tabs at night with poor pain control. Briefly discussed plan to use first line therapy with NSAIDs first, possibly Naproxen. Pt had Tramadol 50mg  #8 after her recent fall and reported feeling great but she understands that for long term pain, this medications might not be ideal.   --Ordered bilateral knee X-rays.

## 2012-01-05 NOTE — Assessment & Plan Note (Signed)
Last LDL 143. Pt on Omega 3 fatty acids. She is wants to start a weight loss plan that could reduce her LDL. Pt was given information about bariatric surgery/weight loss interest group. Will follow up her weight loss progress during her next visit.

## 2012-01-05 NOTE — Assessment & Plan Note (Signed)
Stable. Pt on Zoloft and Wellbutrin but not sure of compliance with Zoloft. Would reassess need for Zoloft (if not taking it everyday) during next visit.

## 2012-01-06 NOTE — Progress Notes (Signed)
I saw patient and discussed her care with resident Dr. Kennerly.  I agree with the clinical findings and plans as outlined in her note. 

## 2012-02-07 ENCOUNTER — Emergency Department (HOSPITAL_COMMUNITY)
Admission: EM | Admit: 2012-02-07 | Discharge: 2012-02-07 | Disposition: A | Discharge status: Home / Self Care | Payer: Medicaid Other | Attending: Emergency Medicine | Admitting: Emergency Medicine

## 2012-02-07 ENCOUNTER — Encounter (HOSPITAL_COMMUNITY): Payer: Self-pay

## 2012-02-07 DIAGNOSIS — F17200 Nicotine dependence, unspecified, uncomplicated: Secondary | ICD-10-CM | POA: Insufficient documentation

## 2012-02-07 DIAGNOSIS — J029 Acute pharyngitis, unspecified: Secondary | ICD-10-CM | POA: Insufficient documentation

## 2012-02-07 DIAGNOSIS — F329 Major depressive disorder, single episode, unspecified: Secondary | ICD-10-CM | POA: Insufficient documentation

## 2012-02-07 DIAGNOSIS — E785 Hyperlipidemia, unspecified: Secondary | ICD-10-CM | POA: Insufficient documentation

## 2012-02-07 DIAGNOSIS — I10 Essential (primary) hypertension: Secondary | ICD-10-CM | POA: Insufficient documentation

## 2012-02-07 DIAGNOSIS — F3289 Other specified depressive episodes: Secondary | ICD-10-CM | POA: Insufficient documentation

## 2012-02-07 LAB — RAPID STREP SCREEN (MED CTR MEBANE ONLY): Streptococcus, Group A Screen (Direct): NEGATIVE

## 2012-02-07 MED ORDER — MAGIC MOUTHWASH W/LIDOCAINE: 5.0000 mL | Freq: Three times a day (TID) | ORAL | Status: DC | PRN

## 2012-02-07 NOTE — ED Provider Notes (Signed)
Medical screening examination/treatment/procedure(s) were performed by non-physician practitioner and as supervising physician I was immediately available for consultation/collaboration.  Juliet Rude. Rubin Payor, MD 02/07/12 1627
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
## 2012-02-07 NOTE — ED Notes (Signed)
Pt complains of tongue pain and throat pain, she states that she noticed red and white bumps on her tongue yesterday, she also states that she like to eat lots of acidic foods

## 2012-02-27 ENCOUNTER — Encounter: Payer: Medicaid Other | Admitting: Internal Medicine

## 2012-03-07 ENCOUNTER — Ambulatory Visit (INDEPENDENT_AMBULATORY_CARE_PROVIDER_SITE_OTHER): Payer: Medicaid Other | Admitting: Internal Medicine

## 2012-03-07 ENCOUNTER — Encounter: Payer: Self-pay | Admitting: Internal Medicine

## 2012-03-07 VITALS — BP 145/91 | HR 68 | Temp 98.2°F | Ht 66.0 in | Wt 326.0 lb

## 2012-03-07 DIAGNOSIS — Z0000 Encounter for general adult medical examination without abnormal findings: Secondary | ICD-10-CM

## 2012-03-07 DIAGNOSIS — F329 Major depressive disorder, single episode, unspecified: Secondary | ICD-10-CM

## 2012-03-07 DIAGNOSIS — I10 Essential (primary) hypertension: Secondary | ICD-10-CM

## 2012-03-07 DIAGNOSIS — Z23 Encounter for immunization: Secondary | ICD-10-CM

## 2012-03-07 DIAGNOSIS — B977 Papillomavirus as the cause of diseases classified elsewhere: Secondary | ICD-10-CM

## 2012-03-07 DIAGNOSIS — F17200 Nicotine dependence, unspecified, uncomplicated: Secondary | ICD-10-CM

## 2012-03-07 LAB — GLUCOSE, CAPILLARY: Glucose-Capillary: 95 mg/dL (ref 70–99)

## 2012-03-07 MED ORDER — BUPROPION HCL ER (XL) 300 MG PO TB24: 300.0000 mg | ORAL_TABLET | Freq: Every day | ORAL | Status: DC

## 2012-03-07 NOTE — Progress Notes (Signed)
  Subjective:    Patient ID: Rebekah Howard, female    DOB: 05-07-1961, 51 y.o.   MRN: [id number]  HPI  Pt presents for flu shot today and referral to a gynecologist.  Hx is significant for htn, depression, and HPV.  States that she feels as though she may need increase in bp meds. She does not have a bp meter at home but bp has been at goal on majority of her prior visits.  States that she is embarrassed that she has HPV and would like to get further evaluated at [facility].  She does have a h/o abnormal vaginal bleeding. Last PAP smear Nov 2011 which was negative for intraepithelial lesions or malignancy.  Review of Systems  Constitutional: Negative for fever and unexpected weight change.  HENT: Negative for congestion.   Respiratory: Negative for shortness of breath.   Cardiovascular: Negative for leg swelling.  Gastrointestinal: Negative for abdominal distention.  Genitourinary: Positive for genital sores. Negative for dysuria, vaginal bleeding, vaginal discharge, vaginal pain and menstrual problem.       States HPV warts on outside of vagina and near rectum  Neurological: Negative for numbness and headaches.       Objective:   Physical Exam  Constitutional: She is oriented to person, place, and time. She appears well-developed and well-nourished. No distress.       Morbidly obese  HENT:  Head: Normocephalic and atraumatic.  Cardiovascular: Normal rate, regular rhythm, normal heart sounds and intact distal pulses.   No murmur heard. Pulmonary/Chest: Effort normal and breath sounds normal.  Abdominal: Soft. Bowel sounds are normal.  Genitourinary:       Deferred by patient who would like to get evaluated by GYN  Neurological: She is alert and oriented to person, place, and time.  Skin: Skin is warm and dry.  Psychiatric: She has a normal mood and affect. Her behavior is normal. Judgment and thought content normal.          Assessment & Plan:  1. 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 Preventive Care: flu shot today  3. Depression: Stable but not improve  4.Htn: above goal today

## 2012-03-08 DIAGNOSIS — Z0000 Encounter for general adult medical examination without abnormal findings: Secondary | ICD-10-CM | POA: Insufficient documentation

## 2012-03-08 NOTE — Assessment & Plan Note (Signed)
usually well controlled, BP toady 145/91 pulse 68bpm -will not make adjustments to regimen today -cont HCTZ 25 mg qd

## 2012-03-08 NOTE — Assessment & Plan Note (Signed)
Stable but not improve, Pt had been taking only 150mg  of Bupropion not 300 mg as advised because she did not realize that she had to take 2 of the 150 mg pill.   -Will change script to 300 mg tablet and cont Zoloft.

## 2012-03-08 NOTE — Assessment & Plan Note (Signed)
Flu shot given today

## 2012-03-08 NOTE — Assessment & Plan Note (Addendum)
Anocervical with likely cervical disease. Pt counseled pt who was unaware that HPV is sexually transmitted and can be associated with malignancy.  Pt stated that she had a wart removed (cryo) by our clinic some time ago anf now they have returned and in greater number extending up to her rectum.  States that warts have been present ~2 years. Last PAP normal 2011. -will refer to GYN for further evaluation

## 2012-04-25 ENCOUNTER — Other Ambulatory Visit: Payer: Self-pay | Admitting: Internal Medicine

## 2012-04-25 NOTE — Telephone Encounter (Signed)
Pt was called and informed of refill. 

## 2012-04-25 NOTE — Telephone Encounter (Signed)
Pt states she has been out of medication since Nov 5.

## 2012-06-05 ENCOUNTER — Other Ambulatory Visit: Payer: Self-pay | Admitting: Internal Medicine

## 2012-06-11 ENCOUNTER — Ambulatory Visit (INDEPENDENT_AMBULATORY_CARE_PROVIDER_SITE_OTHER): Payer: Medicaid Other | Admitting: Internal Medicine

## 2012-06-11 ENCOUNTER — Encounter: Payer: Self-pay | Admitting: Internal Medicine

## 2012-06-11 VITALS — BP 139/98 | HR 66 | Temp 97.7°F | Ht 66.0 in | Wt 326.7 lb

## 2012-06-11 DIAGNOSIS — J309 Allergic rhinitis, unspecified: Secondary | ICD-10-CM

## 2012-06-11 DIAGNOSIS — B977 Papillomavirus as the cause of diseases classified elsewhere: Secondary | ICD-10-CM

## 2012-06-11 DIAGNOSIS — I10 Essential (primary) hypertension: Secondary | ICD-10-CM

## 2012-06-11 DIAGNOSIS — R058 Other specified cough: Secondary | ICD-10-CM | POA: Insufficient documentation

## 2012-06-11 DIAGNOSIS — Z6841 Body Mass Index (BMI) 40.0 and over, adult: Secondary | ICD-10-CM

## 2012-06-11 DIAGNOSIS — J310 Chronic rhinitis: Secondary | ICD-10-CM

## 2012-06-11 DIAGNOSIS — R05 Cough: Secondary | ICD-10-CM

## 2012-06-11 MED ORDER — FLUTICASONE PROPIONATE 50 MCG/ACT NA SUSP: 2.0000 | Freq: Every day | NASAL | Status: DC

## 2012-06-11 MED ORDER — LORATADINE 10 MG PO TABS: 10.0000 mg | ORAL_TABLET | Freq: Every day | ORAL | Status: DC

## 2012-06-11 MED ORDER — CHLORPHENIRAMINE MALEATE 4 MG PO TABS: 4.0000 mg | ORAL_TABLET | Freq: Four times a day (QID) | ORAL | Status: DC | PRN

## 2012-06-11 MED ORDER — LISINOPRIL 20 MG PO TABS: 20.0000 mg | ORAL_TABLET | Freq: Every day | ORAL | Status: DC

## 2012-06-11 NOTE — Assessment & Plan Note (Signed)
BP Readings from Last 3 Encounters:  06/11/12 139/98  03/07/12 145/91  02/07/12 126/72    Lab Results  Component Value Date   NA 139 01/03/2012   K 4.2 01/03/2012   CREATININE 0.81 01/03/2012    Assessment:  Blood pressure control: mildly elevated  Progress toward BP goal:  unchanged  Comments: pt unmotivated to exercise  Plan:  Medications:  continue current medications  Educational resources provided:    Self management tools provided:    Other plans: cont hctz 25 mg qd and start lisinopril 20 mg qd, anticipate future combo pill of Lisinopril-hctz  20-25.

## 2012-06-11 NOTE — Patient Instructions (Addendum)
General Instructions: Your rhinitis and post-nasal drip may be due to allergies and is causing the cough. Start taking the chlorphenermine to help dry up the secretions. If you can afford the Flonase, it will help in the long run. We have also prescribed loratadine which is generic claritin.  Start taking this after you finish the course of chlorpheneramine. This will help your symptoms long term. For your blood pressure, start taking lisinopril daily. Return to clinic in a couple of weeks for a blood pressure recheck. Keep your appointment with the gynecologist.   Treatment Goals:  Goals (1 Years of Data) as of 06/11/2012          As of Today 03/07/12 02/07/12 01/03/12 12/22/11     Blood Pressure    . Blood Pressure < 140/90  139/98 145/91 126/72 125/82 119/74      Progress Toward Treatment Goals:  Treatment Goal 06/11/2012  Blood pressure unchanged    Self Care Goals & Plans:   Try to get motivated to exercise.  This will help your blood pressure.    Care Management & Community Referrals:

## 2012-06-11 NOTE — Progress Notes (Signed)
  Subjective:    Patient ID: Rebekah Howard, female    DOB: 1960/08/07, 52 y.o.   MRN: [id number]  HPI  Presents today for non-productive cough.  States that symptoms are intermittent and worsen when she lies down at night.  Associated with post-nasal drainage and worsens with cigarette smoke or dust.  Not taking any cough suppressants and feels that it is not a cold. No fever, chills, chest pain or shortness of breath.  Requesting another gyn referral because she missed her previously scheduled appointment for evaluation of her HPV lesions.  Currently still smokes and would like to lose weight which is stable at 326 pounds with BMI 52 but states that she is unmotivated to exercise at this time.  Bp with elevated diastolic again on this visit. Pt states that she has been eating "a lot of pork and had a lot of chittlins over the holiday".  Review of Systems As per HPI    Objective:   Physical Exam  Constitutional: She is oriented to person, place, and time. She appears well-developed and well-nourished.       Morbidly obese  HENT:  Head: Normocephalic and atraumatic.  Nose: Mucosal edema and rhinorrhea present. No sinus tenderness, nasal deformity or septal deviation. Right sinus exhibits no maxillary sinus tenderness and no frontal sinus tenderness. Left sinus exhibits no maxillary sinus tenderness and no frontal sinus tenderness.  Mouth/Throat: Mucous membranes are normal. No oropharyngeal exudate.    Cardiovascular: Normal rate, regular rhythm and normal heart sounds.   Pulmonary/Chest: Effort normal and breath sounds normal. No respiratory distress. She has no wheezes. She has no rales.  Neurological: She is alert and oriented to person, place, and time.  Psychiatric: She has a normal mood and affect.          Assessment & Plan:  1. Allergic rhinitis: exacerbated by "other people" smoking and dust, counseled on need for smoking cessation  2. Upper Airway Cough Syndrome:  Flonase and short course chlorpheniramine 4 mg q6h prn to be followed by daily loratadine  3. Hypertension:continues to be above goal on HCTZ 25 mg qd -add lisinopril 20 mg qd  4. HPV: with active lesions -refer to GYN again  5. Morbid Obesity, BMI >50 -currently not motivated to exercise or diet

## 2012-06-12 NOTE — Assessment & Plan Note (Signed)
Pt states that she is unmotivated to exercise at this point.  Counseled on positive effects of exercise and weight loss especially for hypertension.

## 2012-06-12 NOTE — Assessment & Plan Note (Signed)
Trial of chlorpheniramine then daily loratadine in addition to [facility]

## 2012-06-12 NOTE — Assessment & Plan Note (Signed)
Active lesion for many months from vaginal vault to anus, has has one lesion removed in the past with return of multiple lesions currently, pt counseled on sexual transmission and need for evaluation for malignancy risk

## 2012-06-20 ENCOUNTER — Encounter: Payer: Self-pay | Admitting: Obstetrics & Gynecology

## 2012-06-20 ENCOUNTER — Telehealth: Payer: Self-pay | Admitting: *Deleted

## 2012-06-20 NOTE — Telephone Encounter (Signed)
Pt called in to report BP of 135/77. She was started on lisinopril 1/6

## 2012-07-09 ENCOUNTER — Encounter: Payer: Medicaid Other | Admitting: Internal Medicine

## 2012-07-11 ENCOUNTER — Encounter: Payer: Medicaid Other | Admitting: Obstetrics & Gynecology

## 2012-07-23 ENCOUNTER — Encounter: Payer: Medicaid Other | Admitting: Internal Medicine

## 2012-08-06 ENCOUNTER — Encounter: Payer: Self-pay | Admitting: Obstetrics & Gynecology

## 2012-08-06 ENCOUNTER — Ambulatory Visit (INDEPENDENT_AMBULATORY_CARE_PROVIDER_SITE_OTHER): Payer: Medicaid Other | Admitting: Obstetrics & Gynecology

## 2012-08-06 VITALS — BP 124/84 | HR 73 | Ht 64.0 in | Wt 323.5 lb

## 2012-08-06 DIAGNOSIS — A630 Anogenital (venereal) warts: Secondary | ICD-10-CM

## 2012-08-06 NOTE — Progress Notes (Signed)
Patient ID: Rebekah Howard, female   DOB: Apr 23, 1961, 52 y.o.   MRN: [id number] Pt reports a dx of condyloma 2 years prev.  The lesions were treated once but, pt did not return for subsequent treatment.  She is here now for evaluation.  Patient identified, informed consent signed and copy in chart, time out performed.    Areas of typical appearing genital warts noted.  There were multiple large flat areas of condyloma.  They were located between the buttocks bilaterally, on the upper medial thigh bilaterally, on the anus and their were 2 smaller ones near the introitus.   TCA applied until warts had white appearance x 2.  Baking soda poultice applied.  Patient tolerated the procedure well.    Post procedure instructions given and patient told to wash area in thirty minutes.  Return in 2 weeks for next treatment.

## 2012-08-06 NOTE — Patient Instructions (Signed)
Genital Warts Genital warts are a sexually transmitted infection. They may appear as small bumps on the tissues of the genital area. CAUSES  Genital warts are caused by a virus called human papillomavirus (HPV). HPV is the most common sexually transmitted disease (STD) and infection of the sex organs. This infection is spread by having unprotected sex with an infected person. It can be spread by vaginal, anal, and oral sex. Many people do not know they are infected. They may be infected for years without problems. However, even if they do not have problems, they can unknowingly pass the infection to their sexual partners. SYMPTOMS   Itching and irritation in the genital area.  Warts that bleed.  Painful sexual intercourse. DIAGNOSIS  Warts are usually recognized with the naked eye on the vagina, vulva, perineum, anus, and rectum. Certain tests can also diagnose genital warts, such as:  A Pap test.  A tissue sample (biopsy) exam.  Colposcopy. A magnifying tool is used to examine the vagina and cervix. The HPV cells will change color when certain solutions are used. TREATMENT  Warts can be removed by:  Applying certain chemicals, such as cantharidin or podophyllin.  Liquid nitrogen freezing (cryotherapy).  Immunotherapy with candida or trichophyton injections.  Laser treatment.  Burning with an electrified probe (electrocautery).  Interferon injections.  Surgery. PREVENTION  HPV vaccination can help prevent HPV infections that cause genital warts and that cause cancer of the cervix. It is recommended that the vaccination be given to people between the ages 9 to 26 years old. The vaccine might not work as well or might not work at all if you already have HPV. It should not be given to pregnant women. HOME CARE INSTRUCTIONS   It is important to follow your caregiver's instructions. The warts will not go away without treatment. Repeat treatments are often needed to get rid of warts.  Even after it appears that the warts are gone, the normal tissue underneath often remains infected.  Do not try to treat genital warts with medicine used to treat hand warts. This type of medicine is strong and can burn the skin in the genital area, causing more damage.  Tell your past and current sexual partner(s) that you have genital warts. They may be infected also and need treatment.  Avoid sexual contact while being treated.  Do not touch or scratch the warts. The infection may spread to other parts of your body.  Women with genital warts should have a cervical cancer check (Pap test) at least once a year. This type of cancer is slow-growing and can be cured if found early. Chances of developing cervical cancer are increased with HPV.  Inform your obstetrician about your warts in the event of pregnancy. This virus can be passed to the baby's respiratory tract. Discuss this with your caregiver.  Use a condom during sexual intercourse. Following treatment, the use of condoms will help prevent reinfection.  Ask your caregiver about using over-the-counter anti-itch creams. SEEK MEDICAL CARE IF:   Your treated skin becomes red, swollen, or painful.  You have a fever.  You feel generally ill.  You feel little lumps in and around your genital area.  You are bleeding or have painful sexual intercourse. MAKE SURE YOU:   Understand these instructions.  Will watch your condition.  Will get help right away if you are not doing well or get worse. Document Released: 05/20/2000 Document Revised: 08/15/2011 Document Reviewed: 11/29/2010 ExitCare Patient Information 2013 [employer].  

## 2012-08-22 ENCOUNTER — Ambulatory Visit: Payer: Medicaid Other | Admitting: Obstetrics & Gynecology

## 2012-09-20 ENCOUNTER — Inpatient Hospital Stay (HOSPITAL_COMMUNITY)
Admission: AD | Admit: 2012-09-20 | Discharge: 2012-09-20 | Disposition: A | Discharge status: Home / Self Care | Payer: Medicaid Other | Source: Ambulatory Visit | Attending: Obstetrics & Gynecology | Admitting: Obstetrics & Gynecology

## 2012-09-20 ENCOUNTER — Encounter (HOSPITAL_COMMUNITY): Payer: Self-pay

## 2012-09-20 DIAGNOSIS — A630 Anogenital (venereal) warts: Secondary | ICD-10-CM | POA: Insufficient documentation

## 2012-09-20 DIAGNOSIS — N760 Acute vaginitis: Secondary | ICD-10-CM | POA: Insufficient documentation

## 2012-09-20 DIAGNOSIS — A499 Bacterial infection, unspecified: Secondary | ICD-10-CM | POA: Insufficient documentation

## 2012-09-20 DIAGNOSIS — B9689 Other specified bacterial agents as the cause of diseases classified elsewhere: Secondary | ICD-10-CM | POA: Insufficient documentation

## 2012-09-20 DIAGNOSIS — N926 Irregular menstruation, unspecified: Secondary | ICD-10-CM

## 2012-09-20 LAB — WET PREP, GENITAL
WBC, Wet Prep HPF POC: NONE SEEN
Yeast Wet Prep HPF POC: NONE SEEN

## 2012-09-20 MED ORDER — METRONIDAZOLE 500 MG PO TABS: 500.0000 mg | ORAL_TABLET | Freq: Two times a day (BID) | ORAL | Status: DC

## 2012-09-20 NOTE — MAU Note (Signed)
Has been having monthly cycles, this month period was laste, started on Sunday- this was late and it is heavier than usual.

## 2012-09-20 NOTE — MAU Provider Note (Signed)
Attestation of Attending Supervision of Advanced Practitioner (PA/CNM/NP): Evaluation and management procedures were performed by the Advanced Practitioner under my supervision and collaboration.  I have reviewed the Advanced Practitioner's note and chart, and I agree with the management and plan.  Nicole Defino, MD, FACOG Attending Obstetrician & Gynecologist Faculty Practice, [facility]  
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
## 2012-09-20 NOTE — MAU Note (Signed)
Pt states cycle still going on, is concerned r/t family hx ovarian cysdts, sister had partial hysterectomy.

## 2012-09-21 LAB — GC/CHLAMYDIA PROBE AMP
CT Probe RNA: NEGATIVE
GC Probe RNA: NEGATIVE

## 2012-10-18 ENCOUNTER — Ambulatory Visit: Payer: Medicaid Other | Admitting: Obstetrics & Gynecology

## 2012-10-23 ENCOUNTER — Other Ambulatory Visit: Payer: Self-pay | Admitting: Internal Medicine

## 2012-10-23 NOTE — Telephone Encounter (Signed)
Pt is out of meds  Can you refill for her?

## 2012-10-24 ENCOUNTER — Other Ambulatory Visit: Payer: Self-pay | Admitting: Internal Medicine

## 2012-10-24 DIAGNOSIS — J309 Allergic rhinitis, unspecified: Secondary | ICD-10-CM

## 2012-10-24 DIAGNOSIS — M25562 Pain in left knee: Secondary | ICD-10-CM

## 2012-10-24 DIAGNOSIS — M1710 Unilateral primary osteoarthritis, unspecified knee: Secondary | ICD-10-CM

## 2012-10-24 DIAGNOSIS — E785 Hyperlipidemia, unspecified: Secondary | ICD-10-CM

## 2012-10-24 DIAGNOSIS — K219 Gastro-esophageal reflux disease without esophagitis: Secondary | ICD-10-CM

## 2012-10-24 DIAGNOSIS — F329 Major depressive disorder, single episode, unspecified: Secondary | ICD-10-CM

## 2012-10-24 DIAGNOSIS — I10 Essential (primary) hypertension: Secondary | ICD-10-CM

## 2012-10-24 MED ORDER — LISINOPRIL 20 MG PO TABS: ORAL_TABLET | ORAL | Status: DC

## 2012-10-24 MED ORDER — FLUOXETINE HCL 60 MG PO TABS: 60.0000 mg | ORAL_TABLET | Freq: Every day | ORAL | Status: DC

## 2012-10-24 NOTE — Telephone Encounter (Signed)
Call from pt - states she is out of medication; Dr Bosie Clos is on NF. Can u refill rx?  Thanks

## 2012-10-24 NOTE — Telephone Encounter (Signed)
Pt called about rx.

## 2012-11-03 ENCOUNTER — Other Ambulatory Visit: Payer: Self-pay | Admitting: Internal Medicine

## 2012-11-21 ENCOUNTER — Telehealth: Payer: Self-pay | Admitting: *Deleted

## 2012-11-21 NOTE — Telephone Encounter (Signed)
Pt called and left one message at appr 1545, at 1650 cindy from pt experience called and stated pt had not been called back, i called pt, the med refill she requested had been prescribed by another physician not dr Bosie Clos, she requested wellbutrin, pt is on fluoxetine and will need to see dr schooler to discuss this matter, return appt was to be 07/2012 and pt was a no show, appt is offered today and scheduled for the pm of 7/7. She understands wellbutrin cannot be filled possibly until she is seen by md

## 2012-12-10 ENCOUNTER — Ambulatory Visit (INDEPENDENT_AMBULATORY_CARE_PROVIDER_SITE_OTHER): Payer: Medicaid Other | Admitting: Internal Medicine

## 2012-12-10 ENCOUNTER — Encounter: Payer: Self-pay | Admitting: Internal Medicine

## 2012-12-10 VITALS — BP 112/74 | HR 68 | Temp 97.6°F | Ht 66.5 in | Wt 316.0 lb

## 2012-12-10 DIAGNOSIS — F329 Major depressive disorder, single episode, unspecified: Secondary | ICD-10-CM

## 2012-12-10 DIAGNOSIS — Z6841 Body Mass Index (BMI) 40.0 and over, adult: Secondary | ICD-10-CM

## 2012-12-10 DIAGNOSIS — I10 Essential (primary) hypertension: Secondary | ICD-10-CM

## 2012-12-10 DIAGNOSIS — F17200 Nicotine dependence, unspecified, uncomplicated: Secondary | ICD-10-CM

## 2012-12-10 DIAGNOSIS — R1084 Generalized abdominal pain: Secondary | ICD-10-CM

## 2012-12-10 LAB — POCT URINE PREGNANCY: Preg Test, Ur: NEGATIVE

## 2012-12-10 MED ORDER — LISINOPRIL-HYDROCHLOROTHIAZIDE 20-25 MG PO TABS: 1.0000 | ORAL_TABLET | Freq: Every day | ORAL | Status: DC

## 2012-12-10 MED ORDER — FLUOXETINE HCL 60 MG PO TABS: 80.0000 mg | ORAL_TABLET | Freq: Every day | ORAL | Status: DC

## 2012-12-10 NOTE — Patient Instructions (Addendum)
General Instructions:   It was nice to see you today. Congratulations on the weight lost. Continue to stay hydrated with water. We have updated your medication list. We will check blood work today and a urine pregnancy test.  We will contact you if medications need to be adjusted or the pregnancy test returns positive. Return to see me in 6 weeks for a bp recheck.  Treatment Goals:  Goals (1 Years of Data) as of 12/10/12         As of Today 09/20/12 09/20/12 08/06/12 06/11/12     Blood Pressure    . Blood Pressure < 140/90  112/74 144/88 127/87 124/84 139/98      Progress Toward Treatment Goals:  Treatment Goal 12/10/2012  Blood pressure at goal  Stop smoking smoking less    Self Care Goals & Plans:  Self Care Goal 12/10/2012  Manage my medications take my medicines as prescribed; bring my medications to every visit  Monitor my health (No Data)  Eat healthy foods eat foods that are low in salt; eat baked foods instead of fried foods  Be physically active take a walk every day  Stop smoking go to the [employer] ([email or web address])       Care Management & Community Referrals:

## 2012-12-10 NOTE — Progress Notes (Signed)
  Subjective:    Patient ID: Rebekah Howard, female    DOB: 23-Mar-1961, 52 y.o.   MRN: [id number]  HPI  Presents for bp recheck and to discuss medications.  She has been prescribed a new antidepressant medication called asenapine (Saphris) in addition to fluoxentine which she states has been working well for her depression. She is also eating less and has lost 15 pounds. She is due to return to Dr. Rudi Rummage of [facility] once counselor returns from medical leave but states that she has recently been evaluated by her in the month of June.  No complaints other than unusual abdominal movement "like a baby is kicking" and would like a pregnancy test. Last menses in June 2014 and pt s/p tubal ligation.  Review of Systems  Constitutional: Negative for fever and fatigue.  HENT: Negative for congestion.   Respiratory: Negative for shortness of breath.   Cardiovascular: Negative for chest pain.  Gastrointestinal: Negative for nausea, diarrhea and constipation.       Painless abdominal "movement"  Genitourinary: Negative for dysuria, menstrual problem and pelvic pain.  Skin: Negative for rash.  Neurological: Negative for weakness.  Psychiatric/Behavioral: Negative for dysphoric mood. The patient is not nervous/anxious.        Objective:   Physical Exam  Constitutional: She is oriented to person, place, and time. She appears well-developed and well-nourished. No distress.  Morbidly obese  HENT:  Head: Normocephalic and atraumatic.  Cardiovascular: Normal rate, regular rhythm and normal heart sounds.   Pulmonary/Chest: Effort normal and breath sounds normal.  Abdominal: Soft. Bowel sounds are normal. She exhibits no distension and no mass. There is no tenderness.  obese  Musculoskeletal: Normal range of motion. She exhibits no edema.  Neurological: She is alert and oriented to person, place, and time.  Skin: Skin is warm and dry.  Psychiatric: She has a normal mood and  affect. Her behavior is normal. Judgment and thought content normal.          Assessment & Plan:  See detailed problem list charting:  1. Htn: stable and at goal, will start combo pill lisinopril-HCT 20-25 --CMET to evaluate electolytes, kidney function and liver enzymes on psych meds and bp meds  2. Depression: stable and improved on fluoxetine and asenapine --f/u with [facility]   3. Tobacco abuse: not motivated to stop yet,, decreasing amount  4. Abdominal movement: likely normal in setting of new medications, increased movement overall resulting in 15 lb weight lose -UA pregn---> neg

## 2012-12-10 NOTE — Assessment & Plan Note (Signed)
BP Readings from Last 3 Encounters:  12/10/12 112/74  09/20/12 144/88  08/06/12 124/84    Lab Results  Component Value Date   NA 139 01/03/2012   K 4.2 01/03/2012   CREATININE 0.81 01/03/2012    Assessment: Blood pressure control: controlled Progress toward BP goal:  at goal Comments: will start combo pill of lisinopril 20-HCT 25 qd  Plan: Medications:  continue current medications Educational resources provided: brochure Self management tools provided:   Other plans: cont current regimen but in combo pill formula

## 2012-12-10 NOTE — Assessment & Plan Note (Signed)
  Assessment: Progress toward smoking cessation:  smoking less Barriers to progress toward smoking cessation:  stress Comments: not ready to quit  Plan: Instruction/counseling given:  I counseled patient on the dangers of tobacco use, advised patient to stop smoking, and reviewed strategies to maximize success. Educational resources provided:  [employer]) brochure Self management tools provided:    Medications to assist with smoking cessation:  None Patient agreed to the following self-care plans for smoking cessation: go to the [employer] ([employer])  Other plans: wil cont counseling on each visit

## 2012-12-11 LAB — COMPREHENSIVE METABOLIC PANEL
AST: 17 U/L (ref 0–37)
Alkaline Phosphatase: 76 U/L (ref 39–117)
BUN: 15 mg/dL (ref 6–23)
Calcium: 9.6 mg/dL (ref 8.4–10.5)
Creat: 0.94 mg/dL (ref 0.50–1.10)
Glucose, Bld: 82 mg/dL (ref 70–99)

## 2012-12-11 NOTE — Progress Notes (Signed)
Case discussed with Dr. Schooler soon after the resident saw the patient.  We reviewed the resident's history and exam and pertinent patient test results.  I agree with the assessment, diagnosis, and plan of care documented in the resident's note. 

## 2013-01-06 ENCOUNTER — Encounter (HOSPITAL_COMMUNITY): Payer: Self-pay | Admitting: *Deleted

## 2013-01-06 ENCOUNTER — Emergency Department (HOSPITAL_COMMUNITY)
Admission: EM | Admit: 2013-01-06 | Discharge: 2013-01-06 | Disposition: A | Discharge status: Home / Self Care | Payer: Medicaid Other | Attending: Emergency Medicine | Admitting: Emergency Medicine

## 2013-01-06 DIAGNOSIS — Z91040 Latex allergy status: Secondary | ICD-10-CM | POA: Insufficient documentation

## 2013-01-06 DIAGNOSIS — I10 Essential (primary) hypertension: Secondary | ICD-10-CM | POA: Insufficient documentation

## 2013-01-06 DIAGNOSIS — F329 Major depressive disorder, single episode, unspecified: Secondary | ICD-10-CM | POA: Insufficient documentation

## 2013-01-06 DIAGNOSIS — R110 Nausea: Secondary | ICD-10-CM | POA: Insufficient documentation

## 2013-01-06 DIAGNOSIS — R1310 Dysphagia, unspecified: Secondary | ICD-10-CM | POA: Insufficient documentation

## 2013-01-06 DIAGNOSIS — J020 Streptococcal pharyngitis: Secondary | ICD-10-CM | POA: Insufficient documentation

## 2013-01-06 DIAGNOSIS — F17200 Nicotine dependence, unspecified, uncomplicated: Secondary | ICD-10-CM | POA: Insufficient documentation

## 2013-01-06 DIAGNOSIS — F3289 Other specified depressive episodes: Secondary | ICD-10-CM | POA: Insufficient documentation

## 2013-01-06 DIAGNOSIS — Z8639 Personal history of other endocrine, nutritional and metabolic disease: Secondary | ICD-10-CM | POA: Insufficient documentation

## 2013-01-06 DIAGNOSIS — Z79899 Other long term (current) drug therapy: Secondary | ICD-10-CM | POA: Insufficient documentation

## 2013-01-06 DIAGNOSIS — Z862 Personal history of diseases of the blood and blood-forming organs and certain disorders involving the immune mechanism: Secondary | ICD-10-CM | POA: Insufficient documentation

## 2013-01-06 DIAGNOSIS — R05 Cough: Secondary | ICD-10-CM | POA: Insufficient documentation

## 2013-01-06 DIAGNOSIS — Z7982 Long term (current) use of aspirin: Secondary | ICD-10-CM | POA: Insufficient documentation

## 2013-01-06 DIAGNOSIS — Z8739 Personal history of other diseases of the musculoskeletal system and connective tissue: Secondary | ICD-10-CM | POA: Insufficient documentation

## 2013-01-06 DIAGNOSIS — Z8619 Personal history of other infectious and parasitic diseases: Secondary | ICD-10-CM | POA: Insufficient documentation

## 2013-01-06 DIAGNOSIS — Z8742 Personal history of other diseases of the female genital tract: Secondary | ICD-10-CM | POA: Insufficient documentation

## 2013-01-06 DIAGNOSIS — R059 Cough, unspecified: Secondary | ICD-10-CM | POA: Insufficient documentation

## 2013-01-06 MED ORDER — AMOXICILLIN 250 MG/5ML PO SUSR
500.0000 mg | Freq: Once | ORAL | Status: AC
  Administered 2013-01-06: 500 mg via ORAL
  Filled 2013-01-06: qty 10

## 2013-01-06 MED ORDER — HYDROCODONE-ACETAMINOPHEN 7.5-325 MG/15ML PO SOLN: 15.0000 mL | Freq: Three times a day (TID) | ORAL | Status: DC | PRN

## 2013-01-06 MED ORDER — AMOXICILLIN 250 MG/5ML PO SUSR: 500.0000 mg | Freq: Three times a day (TID) | ORAL | Status: DC

## 2013-01-06 MED ORDER — ACETAMINOPHEN 160 MG/5ML PO SOLN
650.0000 mg | Freq: Once | ORAL | Status: AC
  Administered 2013-01-06: 650 mg via ORAL
  Filled 2013-01-06: qty 20.3

## 2013-01-06 NOTE — ED Provider Notes (Signed)
Medical screening examination/treatment/procedure(s) were performed by non-physician practitioner and as supervising physician I was immediately available for consultation/collaboration.  Flint Melter, MD 01/06/13 [phone]
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
## 2013-01-06 NOTE — ED Notes (Signed)
Denies fever.

## 2013-01-06 NOTE — ED Notes (Signed)
Pt c/o sore throat and difficulty swallowing.

## 2013-01-07 ENCOUNTER — Emergency Department (HOSPITAL_COMMUNITY)
Admission: EM | Admit: 2013-01-07 | Discharge: 2013-01-07 | Disposition: A | Discharge status: Home / Self Care | Payer: Medicaid Other | Attending: Emergency Medicine | Admitting: Emergency Medicine

## 2013-01-07 ENCOUNTER — Encounter (HOSPITAL_COMMUNITY): Payer: Self-pay | Admitting: *Deleted

## 2013-01-07 DIAGNOSIS — F17200 Nicotine dependence, unspecified, uncomplicated: Secondary | ICD-10-CM | POA: Insufficient documentation

## 2013-01-07 DIAGNOSIS — F3289 Other specified depressive episodes: Secondary | ICD-10-CM | POA: Insufficient documentation

## 2013-01-07 DIAGNOSIS — IMO0002 Reserved for concepts with insufficient information to code with codable children: Secondary | ICD-10-CM | POA: Insufficient documentation

## 2013-01-07 DIAGNOSIS — Z2089 Contact with and (suspected) exposure to other communicable diseases: Secondary | ICD-10-CM | POA: Insufficient documentation

## 2013-01-07 DIAGNOSIS — J028 Acute pharyngitis due to other specified organisms: Secondary | ICD-10-CM

## 2013-01-07 DIAGNOSIS — T360X5A Adverse effect of penicillins, initial encounter: Secondary | ICD-10-CM | POA: Insufficient documentation

## 2013-01-07 DIAGNOSIS — F329 Major depressive disorder, single episode, unspecified: Secondary | ICD-10-CM | POA: Insufficient documentation

## 2013-01-07 DIAGNOSIS — J020 Streptococcal pharyngitis: Secondary | ICD-10-CM | POA: Insufficient documentation

## 2013-01-07 DIAGNOSIS — J029 Acute pharyngitis, unspecified: Secondary | ICD-10-CM | POA: Insufficient documentation

## 2013-01-07 DIAGNOSIS — Z8742 Personal history of other diseases of the female genital tract: Secondary | ICD-10-CM | POA: Insufficient documentation

## 2013-01-07 DIAGNOSIS — Z7982 Long term (current) use of aspirin: Secondary | ICD-10-CM | POA: Insufficient documentation

## 2013-01-07 DIAGNOSIS — M1990 Unspecified osteoarthritis, unspecified site: Secondary | ICD-10-CM | POA: Insufficient documentation

## 2013-01-07 DIAGNOSIS — Z79899 Other long term (current) drug therapy: Secondary | ICD-10-CM | POA: Insufficient documentation

## 2013-01-07 DIAGNOSIS — E785 Hyperlipidemia, unspecified: Secondary | ICD-10-CM | POA: Insufficient documentation

## 2013-01-07 DIAGNOSIS — R6883 Chills (without fever): Secondary | ICD-10-CM | POA: Insufficient documentation

## 2013-01-07 DIAGNOSIS — R112 Nausea with vomiting, unspecified: Secondary | ICD-10-CM | POA: Insufficient documentation

## 2013-01-07 DIAGNOSIS — Z91040 Latex allergy status: Secondary | ICD-10-CM | POA: Insufficient documentation

## 2013-01-07 DIAGNOSIS — I10 Essential (primary) hypertension: Secondary | ICD-10-CM | POA: Insufficient documentation

## 2013-01-07 DIAGNOSIS — L658 Other specified nonscarring hair loss: Secondary | ICD-10-CM | POA: Insufficient documentation

## 2013-01-07 DIAGNOSIS — Z8619 Personal history of other infectious and parasitic diseases: Secondary | ICD-10-CM | POA: Insufficient documentation

## 2013-01-07 LAB — CBC WITH DIFFERENTIAL/PLATELET
Basophils Absolute: 0 10*3/uL (ref 0.0–0.1)
Basophils Relative: 0 % (ref 0–1)
Eosinophils Absolute: 0.1 10*3/uL (ref 0.0–0.7)
HCT: 37.5 % (ref 36.0–46.0)
MCHC: 36.8 g/dL — ABNORMAL HIGH (ref 30.0–36.0)
Monocytes Absolute: 0.7 10*3/uL (ref 0.1–1.0)
Neutro Abs: 13 10*3/uL — ABNORMAL HIGH (ref 1.7–7.7)
Neutrophils Relative %: 89 % — ABNORMAL HIGH (ref 43–77)
RDW: 13.8 % (ref 11.5–15.5)

## 2013-01-07 LAB — COMPREHENSIVE METABOLIC PANEL
AST: 33 U/L (ref 0–37)
Albumin: 2.9 g/dL — ABNORMAL LOW (ref 3.5–5.2)
Chloride: 103 mEq/L (ref 96–112)
Creatinine, Ser: 0.64 mg/dL (ref 0.50–1.10)
Total Bilirubin: 0.6 mg/dL (ref 0.3–1.2)
Total Protein: 6.6 g/dL (ref 6.0–8.3)

## 2013-01-07 MED ORDER — IBUPROFEN 800 MG PO TABS
800.0000 mg | ORAL_TABLET | Freq: Once | ORAL | Status: AC
  Administered 2013-01-07: 800 mg via ORAL
  Filled 2013-01-07: qty 1

## 2013-01-07 MED ORDER — DYCLONINE HCL 3 MG MT LOZG: 1.0000 | LOZENGE | OROMUCOSAL | Status: DC | PRN

## 2013-01-07 MED ORDER — PENICILLIN G BENZATHINE & PROC 1200000 UNIT/2ML IM SUSP
1.2000 10*6.[IU] | Freq: Once | INTRAMUSCULAR | Status: AC
  Administered 2013-01-07: 1.2 10*6.[IU] via INTRAMUSCULAR
  Filled 2013-01-07: qty 2

## 2013-01-07 MED ORDER — DEXAMETHASONE 6 MG PO TABS
6.0000 mg | ORAL_TABLET | Freq: Once | ORAL | Status: AC
  Administered 2013-01-07: 6 mg via ORAL
  Filled 2013-01-07: qty 1

## 2013-01-07 MED ORDER — GI COCKTAIL ~~LOC~~
30.0000 mL | Freq: Once | ORAL | Status: AC
  Administered 2013-01-07: 30 mL via ORAL
  Filled 2013-01-07: qty 30

## 2013-01-07 MED ORDER — ONDANSETRON 4 MG PO TBDP
8.0000 mg | ORAL_TABLET | Freq: Once | ORAL | Status: AC
  Administered 2013-01-07: 8 mg via ORAL
  Filled 2013-01-07: qty 2

## 2013-01-07 MED ORDER — HYDROCODONE-ACETAMINOPHEN 5-325 MG PO TABS
2.0000 | ORAL_TABLET | Freq: Once | ORAL | Status: AC
  Administered 2013-01-07: 2 via ORAL
  Filled 2013-01-07: qty 2

## 2013-01-07 MED ORDER — HYDROCODONE-ACETAMINOPHEN 5-325 MG PO TABS: 1.0000 | ORAL_TABLET | Freq: Four times a day (QID) | ORAL | Status: DC | PRN

## 2013-01-07 MED ORDER — BENZOCAINE-MENTHOL 15-10 MG MT LOZG: 1.0000 | LOZENGE | OROMUCOSAL | Status: DC | PRN

## 2013-01-07 MED ORDER — IBUPROFEN 800 MG PO TABS: 800.0000 mg | ORAL_TABLET | Freq: Three times a day (TID) | ORAL | Status: DC

## 2013-01-07 NOTE — ED Notes (Signed)
Dr. Bonk at bedside 

## 2013-01-07 NOTE — ED Notes (Signed)
Pt states does not have ride home besides driving private vehicle. Pt provided bus pass and agrees to use this for transportation home at this time

## 2013-01-07 NOTE — ED Notes (Signed)
Pt comfortable with d/c and f/u instructions. Prescriptions x4, bus pass given
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
## 2013-01-07 NOTE — ED Notes (Signed)
Pt unable to provide urine at this time

## 2013-01-07 NOTE — ED Notes (Signed)
Pt seen here yesterday and started on amoxicillin for strep throat.  PT still has red irritated throat with tonsil swelling.  Pt states she started vomiting yesterday evening around 6pm and she thinks it is from the medication.  Pt denies abdominal pain, just reports nausea

## 2013-01-14 ENCOUNTER — Encounter: Payer: Medicaid Other | Admitting: Internal Medicine

## 2013-01-18 ENCOUNTER — Ambulatory Visit: Payer: Medicaid Other | Admitting: Internal Medicine

## 2013-03-08 ENCOUNTER — Inpatient Hospital Stay (HOSPITAL_COMMUNITY)
Admission: AD | Admit: 2013-03-08 | Discharge: 2013-03-08 | Disposition: A | Discharge status: Home / Self Care | Payer: Medicaid Other | Source: Ambulatory Visit | Attending: Obstetrics & Gynecology | Admitting: Obstetrics & Gynecology

## 2013-03-08 ENCOUNTER — Encounter (HOSPITAL_COMMUNITY): Payer: Self-pay | Admitting: *Deleted

## 2013-03-08 DIAGNOSIS — R238 Other skin changes: Secondary | ICD-10-CM

## 2013-03-08 DIAGNOSIS — N63 Unspecified lump in unspecified breast: Secondary | ICD-10-CM | POA: Insufficient documentation

## 2013-03-08 DIAGNOSIS — L988 Other specified disorders of the skin and subcutaneous tissue: Secondary | ICD-10-CM | POA: Insufficient documentation

## 2013-03-08 LAB — POCT PREGNANCY, URINE: Preg Test, Ur: NEGATIVE
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
## 2013-03-08 NOTE — MAU Note (Signed)
Patient presents to MAU with c/o knot under left breast that is not painful at this time. States noticed it today following her shower.

## 2013-03-08 NOTE — MAU Provider Note (Signed)
Attestation of Attending Supervision of Advanced Practitioner (PA/CNM/NP): Evaluation and management procedures were performed by the Advanced Practitioner under my supervision and collaboration.  I have reviewed the Advanced Practitioner's note and chart, and I agree with the management and plan.  Leoda Smithhart, MD, FACOG Attending Obstetrician & Gynecologist Faculty Practice, [facility]  

## 2013-08-22 ENCOUNTER — Ambulatory Visit: Payer: Self-pay | Admitting: Internal Medicine

## 2013-10-04 ENCOUNTER — Other Ambulatory Visit: Payer: Self-pay | Admitting: Internal Medicine

## 2013-11-09 ENCOUNTER — Other Ambulatory Visit: Payer: Self-pay | Admitting: Internal Medicine

## 2014-01-03 ENCOUNTER — Other Ambulatory Visit: Payer: Self-pay | Admitting: *Deleted

## 2014-01-03 MED ORDER — LISINOPRIL-HYDROCHLOROTHIAZIDE 20-25 MG PO TABS: 1.0000 | ORAL_TABLET | Freq: Every day | ORAL | Status: DC

## 2014-01-03 NOTE — Telephone Encounter (Signed)
No showed or cancelled last 3 appts. Gave 30 days. Needs appt to get anymore refills

## 2014-01-09 ENCOUNTER — Telehealth: Payer: Self-pay | Admitting: Licensed Clinical Social Worker

## 2014-01-09 ENCOUNTER — Telehealth: Payer: Self-pay | Admitting: *Deleted

## 2014-01-09 NOTE — Telephone Encounter (Signed)
CSW notified pt complains of difficulty obtaining transportation to medical appointments.  Pt chart shows active Medicaid.  CSW placed called to pt.  CSW left message requesting return call. CSW provided contact hours and phone number.    Ms. Tolen returned call to [place].  Pt was unaware of Medicaid Medical transportation.  CSW discussed option for bus passes or [employer] van.  Pt states bus stop is quite a distance.  CSW provided Ms. Spurgin with the number to [facility] and instructed to notify CSW if there were any issues with scheduling for next weeks appointment.  Pt provided with CSW contact information and hours.

## 2014-01-09 NOTE — Telephone Encounter (Signed)
Thank you.  Ms. Viverette is showing as still active with Medicaid.  I will offer Medicaid Medical transportation assistance.

## 2014-01-09 NOTE — Telephone Encounter (Signed)
Hey shana, this pt has not had visits recently, charsetta spoke w/ her and she states she does not have transportation, could you see if she has options? May not be anything you can do but i wanted to try Thanks, as always, h.a.

## 2014-01-10 NOTE — Telephone Encounter (Signed)
Pt returned call and left message regarding scheduling difficulty.  CSW placed transportation request.  Ms. Haberman notified of pick up time for her [facility] appointment on 01/14/14.  Pick up between 8:55am -9:35am.

## 2014-01-14 ENCOUNTER — Encounter: Payer: Self-pay | Admitting: Internal Medicine

## 2014-01-14 ENCOUNTER — Encounter: Payer: Self-pay | Admitting: Licensed Clinical Social Worker

## 2014-01-14 ENCOUNTER — Other Ambulatory Visit: Payer: Self-pay | Admitting: Internal Medicine

## 2014-01-14 ENCOUNTER — Ambulatory Visit (INDEPENDENT_AMBULATORY_CARE_PROVIDER_SITE_OTHER): Payer: Medicaid Other | Admitting: Internal Medicine

## 2014-01-14 VITALS — BP 130/79 | HR 66 | Temp 97.9°F | Ht 66.0 in | Wt 316.0 lb

## 2014-01-14 DIAGNOSIS — M25561 Pain in right knee: Secondary | ICD-10-CM

## 2014-01-14 DIAGNOSIS — Z1231 Encounter for screening mammogram for malignant neoplasm of breast: Secondary | ICD-10-CM

## 2014-01-14 DIAGNOSIS — E785 Hyperlipidemia, unspecified: Secondary | ICD-10-CM

## 2014-01-14 DIAGNOSIS — I10 Essential (primary) hypertension: Secondary | ICD-10-CM

## 2014-01-14 DIAGNOSIS — Z6841 Body Mass Index (BMI) 40.0 and over, adult: Secondary | ICD-10-CM

## 2014-01-14 DIAGNOSIS — Z0000 Encounter for general adult medical examination without abnormal findings: Secondary | ICD-10-CM

## 2014-01-14 DIAGNOSIS — F17200 Nicotine dependence, unspecified, uncomplicated: Secondary | ICD-10-CM

## 2014-01-14 DIAGNOSIS — M25569 Pain in unspecified knee: Secondary | ICD-10-CM

## 2014-01-14 DIAGNOSIS — F329 Major depressive disorder, single episode, unspecified: Secondary | ICD-10-CM

## 2014-01-14 DIAGNOSIS — M25562 Pain in left knee: Secondary | ICD-10-CM

## 2014-01-14 DIAGNOSIS — B977 Papillomavirus as the cause of diseases classified elsewhere: Secondary | ICD-10-CM

## 2014-01-14 DIAGNOSIS — F3289 Other specified depressive episodes: Secondary | ICD-10-CM

## 2014-01-14 LAB — GLUCOSE, CAPILLARY: Glucose-Capillary: 93 mg/dL (ref 70–99)

## 2014-01-14 LAB — CBC WITH DIFFERENTIAL/PLATELET
Basophils Absolute: 0 10*3/uL (ref 0.0–0.1)
Basophils Relative: 0 % (ref 0–1)
EOS ABS: 0.2 10*3/uL (ref 0.0–0.7)
EOS PCT: 4 % (ref 0–5)
HCT: 40.5 % (ref 36.0–46.0)
Hemoglobin: 14.1 g/dL (ref 12.0–15.0)
Lymphocytes Relative: 25 % (ref 12–46)
Lymphs Abs: 1.2 10*3/uL (ref 0.7–4.0)
MCH: 29.9 pg (ref 26.0–34.0)
MCHC: 34.8 g/dL (ref 30.0–36.0)
MCV: 86 fL (ref 78.0–100.0)
Monocytes Absolute: 0.3 10*3/uL (ref 0.1–1.0)
Monocytes Relative: 6 % (ref 3–12)
Neutro Abs: 3.2 10*3/uL (ref 1.7–7.7)
Neutrophils Relative %: 65 % (ref 43–77)
PLATELETS: 256 10*3/uL (ref 150–400)
RBC: 4.71 MIL/uL (ref 3.87–5.11)
RDW: 15.4 % (ref 11.5–15.5)
WBC: 4.9 10*3/uL (ref 4.0–10.5)

## 2014-01-14 LAB — LIPID PANEL
Cholesterol: 220 mg/dL — ABNORMAL HIGH (ref 0–200)
HDL: 47 mg/dL (ref >39)
LDL CALC: 147 mg/dL — AB (ref 0–99)
Total CHOL/HDL Ratio: 4.7 Ratio
Triglycerides: 129 mg/dL (ref <150)
VLDL: 26 mg/dL (ref 0–40)

## 2014-01-14 LAB — COMPLETE METABOLIC PANEL WITH GFR
ALBUMIN: 3.7 g/dL (ref 3.5–5.2)
ALT: 19 U/L (ref 0–35)
AST: 21 U/L (ref 0–37)
Alkaline Phosphatase: 71 U/L (ref 39–117)
BUN: 11 mg/dL (ref 6–23)
CALCIUM: 8.8 mg/dL (ref 8.4–10.5)
CHLORIDE: 100 meq/L (ref 96–112)
CO2: 29 mEq/L (ref 19–32)
Creat: 0.75 mg/dL (ref 0.50–1.10)
GFR, Est African American: >89 mL/min
GFR, Est Non African American: >89 mL/min
Glucose, Bld: 83 mg/dL (ref 70–99)
Potassium: 3.9 mEq/L (ref 3.5–5.3)
SODIUM: 135 meq/L (ref 135–145)
TOTAL PROTEIN: 6.5 g/dL (ref 6.0–8.3)
Total Bilirubin: 0.6 mg/dL (ref 0.2–1.2)

## 2014-01-14 LAB — POCT GLYCOSYLATED HEMOGLOBIN (HGB A1C): Hemoglobin A1C: 5.4

## 2014-01-14 MED ORDER — NICOTINE 14 MG/24HR TD PT24: 14.0000 mg | MEDICATED_PATCH | TRANSDERMAL | Status: DC

## 2014-01-14 MED ORDER — BUPROPION HCL ER (XL) 150 MG PO TB24: 150.0000 mg | ORAL_TABLET | Freq: Every day | ORAL | Status: DC

## 2014-01-14 NOTE — Assessment & Plan Note (Signed)
-  pap smear needed with h/o HPV; referral to gynecology -mammogram order placed today  -smoking cessation encouraged; nicotine patches ordered and bupropion to start next month when pt is able to afford these
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
## 2014-01-14 NOTE — Assessment & Plan Note (Signed)
  Assessment: Progress toward smoking cessation:  smoking the same amount Barriers to progress toward smoking cessation:  lack of motivation to quit Comments: pt tried chantix in the past but made her have dreams and didn't like it   Plan: Instruction/counseling given:  I counseled patient on the dangers of tobacco use, advised patient to stop smoking, and reviewed strategies to maximize success. Educational resources provided:  [facility] (1-800-QUIT-NOW) brochure Medications to assist with smoking cessation:  Bupropion (Zyban) and nicotine patches 14mg

## 2014-01-14 NOTE — Assessment & Plan Note (Signed)
-  referral to gynecology

## 2014-01-14 NOTE — Patient Instructions (Signed)
Thank you for your visit today.    Please return to the internal medicine clinic in 4-6 weeks or sooner if needed.     I have made the following additions/changes to your medications: I would like you to discontinue fluoxetine and begin buproprion XL next month.  Please do not take these medication together.  For example, please finish the fluoxetine you have and then start buproprion the next day (take 1 tab daily).   I would also like to start you on the nicotine patches this will help with smoking cessation.  You will have a small co-pay.   I have made the following referrals for you: gynecology for pap smear.  You need the following test(s) for regular health maintenance: Order placed for mammogram today.   Please be sure to bring all of your medications with you to every visit; this includes herbal supplements, vitamins, eye drops, and any over-the-counter medications.   Should you have any questions regarding your medications and/or any new or worsening symptoms, please be sure to call the clinic at [phone].   If you believe that you are suffering from a life threatening condition or one that may result in the loss of limb or function, then you should call 911 or proceed to the nearest Emergency Department.     A healthy lifestyle and preventative care can promote health and wellness.   Maintain regular health, dental, and eye exams.  Eat a healthy diet. Foods like vegetables, fruits, whole grains, low-fat dairy products, and lean protein foods contain the nutrients you need without too many calories. Decrease your intake of foods high in solid fats, added sugars, and salt. Get information about a proper diet from your caregiver, if necessary.  Regular physical exercise is one of the most important things you can do for your health. Most adults should get at least 150 minutes of moderate-intensity exercise (any activity that increases your heart rate and causes you to sweat)  each week. In addition, most adults need muscle-strengthening exercises on 2 or more days a week.   Maintain a healthy weight. The body mass index (BMI) is a screening tool to identify possible weight problems. It provides an estimate of body fat based on height and weight. Your caregiver can help determine your BMI, and can help you achieve or maintain a healthy weight. For adults 20 years and older:  A BMI below 18.5 is considered underweight.  A BMI of 18.5 to 24.9 is normal.  A BMI of 25 to 29.9 is considered overweight.  A BMI of 30 and above is considered obese.

## 2014-01-14 NOTE — Assessment & Plan Note (Signed)
BP well controlled today, 130/79.   -continue current meds of zestoretic 20-25mg  daily -check CMP  -encourage smoking cessation/weight loss/exercise

## 2014-01-14 NOTE — Assessment & Plan Note (Signed)
-  encouraged weight loss and info provided for bariatric surgery

## 2014-01-14 NOTE — Assessment & Plan Note (Addendum)
Lab Results  Component Value Date   CHOL 228* 06/15/2011   HDL 51 06/15/2011   LDLCALC 143* 06/15/2011   TRIG 170* 06/15/2011   CHOLHDL 4.5 06/15/2011   Pt is on fish oil concentrate but is not on a statin.  -will check lipid panel today  -encouraged weight loss/exercise

## 2014-01-14 NOTE — Assessment & Plan Note (Addendum)
Pt interested in gastric bypass and was provided information.  Additionally, pt is on fluoxetine which may also be contributing. Has gained weight since last OV (309-->316 lbs).  TSH was normal.  Pt reports depression and not getting out of her house much.  Has been on bupropion and zoloft in the past but unclear as to why she was switched to fluoxetine.   -encouraged weight loss/exercise -info provided for bariatric surgery  -d/c fluoxetine  -referral to [facility] for nutrition counseling -start bupropion XL next month (pt unable to afford med at this time--she will finish out what she has of fluoxetine and start bupropion next month; instructed her NOT to take these medications together as this may increase her risk of serotonin syndrome)

## 2014-01-14 NOTE — Progress Notes (Signed)
CSW had the opportunity of meeting with [name] during her scheduled [facility] appointment.  Pt referred for behavioral health services.  [name] states she was established with [facility], but was unable to maintain due to lack of transportation.  Now that pt is aware of Medicaid Medical transportation, pt requesting to re-establish.  CSW placed call to [facility], pt previous therapist is no longer with facility but there are other therapist accepting new clients. [facility] states pt would need to call and provide MID and appointment would be scheduled fairly quick.  CSW notified [name] of the information above and provided contact information to [facility]. CSW discussed Medicaid Medical transportation and pt aware medicaid provide transportation to all appointments medicaid covers in addition to pharmacy.  Pt notified pharmacy can not withhold dispensed medications for lack of medicaid copayment.  CSW provided [name] with [employer] and verifications forms.
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
## 2014-01-15 LAB — VITAMIN D 25 HYDROXY (VIT D DEFICIENCY, FRACTURES): Vit D, 25-Hydroxy: 44 ng/mL (ref 30–89)

## 2014-01-15 NOTE — Progress Notes (Signed)
INTERNAL MEDICINE TEACHING ATTENDING ADDENDUM - Uziah Sorter, MD: I reviewed and discussed at the time of visit with the resident Dr. Gill, the patient's medical history, physical examination, diagnosis and results of pertinent tests and treatment and I agree with the patient's care as documented.  

## 2014-01-30 ENCOUNTER — Encounter: Payer: Self-pay | Admitting: Obstetrics & Gynecology

## 2014-02-04 ENCOUNTER — Other Ambulatory Visit: Payer: Self-pay | Admitting: Internal Medicine

## 2014-02-18 ENCOUNTER — Other Ambulatory Visit: Payer: Self-pay | Admitting: Internal Medicine

## 2014-02-21 ENCOUNTER — Ambulatory Visit: Payer: Self-pay | Admitting: Internal Medicine

## 2014-03-11 ENCOUNTER — Other Ambulatory Visit: Payer: Self-pay | Admitting: *Deleted

## 2014-03-11 MED ORDER — LISINOPRIL-HYDROCHLOROTHIAZIDE 20-25 MG PO TABS: 1.0000 | ORAL_TABLET | Freq: Every day | ORAL | Status: DC

## 2014-03-17 ENCOUNTER — Encounter: Payer: Self-pay | Admitting: Obstetrics & Gynecology

## 2014-03-18 ENCOUNTER — Encounter: Payer: Self-pay | Admitting: Licensed Clinical Social Worker

## 2014-03-18 ENCOUNTER — Telehealth: Payer: Self-pay | Admitting: Licensed Clinical Social Worker

## 2014-03-18 NOTE — Telephone Encounter (Signed)
Rebekah Howard left voicemail for CSW on 03/14/14, after CSW hours requesting the phone number to [place].  CSW returned call on next business day, left message with the phone number to [place].  Pt called back to CSW.  CSW provided phone number.  Pt inquiring about transportation for her mother's dialysis appointment.  CSW states pt's mother may qualify based on age.  CSW encouraged Rebekah Howard to inquire about mother during her scheduling phone call.  In addition, CSW will mail [employer] application for pt's mother to pt's address per her request.

## 2014-03-24 NOTE — Addendum Note (Signed)
Addended by: Hulan Fray on: 03/24/2014 06:27 PM   Modules accepted: Orders

## 2014-04-07 ENCOUNTER — Encounter: Payer: Self-pay | Admitting: Internal Medicine

## 2014-04-09 ENCOUNTER — Encounter: Payer: Self-pay | Admitting: Internal Medicine

## 2014-04-09 ENCOUNTER — Encounter: Payer: Self-pay | Admitting: Dietician

## 2014-04-09 ENCOUNTER — Ambulatory Visit: Payer: Medicaid Other | Admitting: *Deleted

## 2014-04-09 ENCOUNTER — Ambulatory Visit (INDEPENDENT_AMBULATORY_CARE_PROVIDER_SITE_OTHER): Payer: Medicaid Other | Admitting: Internal Medicine

## 2014-04-09 ENCOUNTER — Ambulatory Visit (INDEPENDENT_AMBULATORY_CARE_PROVIDER_SITE_OTHER): Payer: Medicaid Other | Admitting: Dietician

## 2014-04-09 ENCOUNTER — Ambulatory Visit: Payer: Self-pay | Admitting: Internal Medicine

## 2014-04-09 VITALS — BP 130/85 | HR 64 | Temp 98.2°F | Ht 66.0 in | Wt 315.1 lb

## 2014-04-09 DIAGNOSIS — I10 Essential (primary) hypertension: Secondary | ICD-10-CM

## 2014-04-09 DIAGNOSIS — F329 Major depressive disorder, single episode, unspecified: Secondary | ICD-10-CM

## 2014-04-09 DIAGNOSIS — Z0000 Encounter for general adult medical examination without abnormal findings: Secondary | ICD-10-CM

## 2014-04-09 DIAGNOSIS — Z720 Tobacco use: Secondary | ICD-10-CM

## 2014-04-09 DIAGNOSIS — Z713 Dietary counseling and surveillance: Secondary | ICD-10-CM

## 2014-04-09 DIAGNOSIS — Z113 Encounter for screening for infections with a predominantly sexual mode of transmission: Secondary | ICD-10-CM

## 2014-04-09 DIAGNOSIS — F32A Depression, unspecified: Secondary | ICD-10-CM

## 2014-04-09 DIAGNOSIS — F17200 Nicotine dependence, unspecified, uncomplicated: Secondary | ICD-10-CM

## 2014-04-09 DIAGNOSIS — Z23 Encounter for immunization: Secondary | ICD-10-CM

## 2014-04-09 DIAGNOSIS — Z6841 Body Mass Index (BMI) 40.0 and over, adult: Secondary | ICD-10-CM

## 2014-04-09 DIAGNOSIS — Z1231 Encounter for screening mammogram for malignant neoplasm of breast: Secondary | ICD-10-CM

## 2014-04-09 MED ORDER — NICOTINE POLACRILEX 4 MG MT GUM: 4.0000 mg | CHEWING_GUM | OROMUCOSAL | Status: DC | PRN

## 2014-04-09 MED ORDER — BUPROPION HCL ER (XL) 150 MG PO TB24
ORAL_TABLET | ORAL | Status: DC
Start: 2014-04-09 — End: 2014-07-07

## 2014-04-09 MED ORDER — LISINOPRIL-HYDROCHLOROTHIAZIDE 20-25 MG PO TABS: 1.0000 | ORAL_TABLET | Freq: Every day | ORAL | Status: DC

## 2014-04-09 NOTE — Assessment & Plan Note (Addendum)
Given flu shot today, sch'ed mammogram, will f/u with pap smear womens 04/23/14 2:45 pm Will check HIV today

## 2014-04-09 NOTE — Progress Notes (Signed)
Medical Nutrition Therapy:  Appt start time: 1608 end time:  1625.  Assessment:  Primary concerns today: Weight management.  Patient was hungry and had a ride waiting so we decided to defer a full appointment. She reports transportation problems and emotional eating as barriers to weight loss. She has been dieting "all her life" and has been as much as 400# at one time. Has tried meal replacements and agrees that diets do not work. Suggested  a follow up appointment and/or frequent meetings for support of weight loss and offered to do this by phone if needed.  Learning Readiness: Not ready today due to competing values Barriers to learning/adherence to lifestyle change: transportation  Progress Towards Goal(s):  No progress.   Nutritional Diagnosis:  NB-1.3 Not ready for diet/lifestyle change  As related to competing values.  As evidenced by her report and not wanting to stay or work with dietitian. .    Intervention:  Nutrition education about process of behavior change, also gave her information about [facility] Medicaid [place]. . Handouts given during visit include: [place] [facility] and business card Demonstrated degree of understanding via:  Teach Back   Monitoring/Evaluation:  Dietary intake, exercise, and body weight prn.

## 2014-04-09 NOTE — Progress Notes (Signed)
   Subjective:    Patient ID: Rebekah Howard, female    DOB: February 16, 1961, 53 y.o.   MRN: [id number]  HPI Comments: 53 y.o PMH tobacco abuse, ovarian cyst, HTN, obesity, HLD, HPV, GERD, depression, allergic rhinitis   She presents for  1. She wants HIV test and medication refill of BP medication Zestoretic 20-25 mg  2. HTN controlled today 130/80.   3. Obesity-interested in losing weight but has gained 1 lb since last visit.  4. STD screening will do HIV today and will do rest of pap smear and other STD screening 04/23/14 at 2:45 PM at [facility] clinic appt 5. H/o depression-she wants Rx refill of Wellbutrin XL 150 mg qd.  She states it is working a little.  She still has suicidal ideation during periods of stress but denies a plan and states she is "not ready to die". She will call [facility] to sch f/u with her counselor.   6. Smoking 1 pack will last 2-3 days.  She has not picked up the patch and states she wants to try the gum.    [facility] appt sch 11/18 12:45 for f/u HPV. Order in for screening mammogram but not yet sch. Will sch today, will get flu shot today     Review of Systems  Respiratory: Negative for shortness of breath.   Cardiovascular: Negative for chest pain and leg swelling.       Objective:   Physical Exam  Constitutional: She is oriented to person, place, and time. Vital signs are normal. She appears well-developed and well-nourished. She is cooperative. No distress.  HENT:  Head: Normocephalic and atraumatic.  Mouth/Throat: Oropharynx is clear and moist and mucous membranes are normal. No oropharyngeal exudate.  Eyes: Conjunctivae are normal. Right eye exhibits no discharge. Left eye exhibits no discharge. No scleral icterus.  Cardiovascular: Normal rate, regular rhythm, S1 normal, S2 normal and normal heart sounds.   No murmur heard. No lower ext edema   Pulmonary/Chest: Effort normal and breath sounds normal. No respiratory distress. She has no  wheezes.  Abdominal: Soft. Bowel sounds are normal. There is no tenderness.  Obese ab  Neurological: She is alert and oriented to person, place, and time. Gait normal.  Skin: Skin is warm, dry and intact. No rash noted. She is not diaphoretic.  Psychiatric: She has a normal mood and affect. Her speech is normal and behavior is normal. Judgment and thought content normal. Cognition and memory are normal.  Nursing note and vitals reviewed.         Assessment & Plan:  F/u in 4-6 months, sooner if needed

## 2014-04-09 NOTE — Assessment & Plan Note (Signed)
Struggles with obesity  dis'ced diet and exercise given info about exercise Will have pt meet with Barry Brunner today

## 2014-04-09 NOTE — Patient Instructions (Signed)
General Instructions: Please follow up in 4-6 months, sooner if needed  Read the information below  We will check your lab today and I will call you.  Treatment Goals:  Goals (1 Years of Data) as of 04/09/14          As of Today 01/14/14 03/08/13     Blood Pressure   . Blood Pressure < 140/90  130/85 130/79 130/83      Progress Toward Treatment Goals:  Treatment Goal 04/09/2014  Blood pressure at goal  Stop smoking (No Data)    Self Care Goals & Plans:  Self Care Goal 04/09/2014  Manage my medications take my medicines as prescribed; bring my medications to every visit; refill my medications on time; follow the sick day instructions if I am sick  Monitor my health keep track of my blood glucose  Eat healthy foods drink diet soda or water instead of juice or soda; eat more vegetables; eat foods that are low in salt; eat baked foods instead of fried foods; eat fruit for snacks and desserts; eat smaller portions  Be physically active find an activity I enjoy  Stop smoking call [facility] (1-800-QUIT-NOW)  Meeting treatment goals maintain the current self-care plan    No flowsheet data found.   Care Management & Community Referrals:  Referral 04/09/2014  Referrals made for care management support none needed  Referrals made to community resources none       Exercise to Lose Weight Exercise and a healthy diet may help you lose weight. Your doctor may suggest specific exercises. EXERCISE IDEAS AND TIPS  Choose low-cost things you enjoy doing, such as walking, bicycling, or exercising to workout videos.  Take stairs instead of the elevator.  Walk during your lunch break.  Park your car further away from work or school.  Go to a gym or an exercise class.  Start with 5 to 10 minutes of exercise each day. Build up to 30 minutes of exercise 4 to 6 days a week.  Wear shoes with good support and comfortable clothes.  Stretch before and after working out.  Work out until  you breathe harder and your heart beats faster.  Drink extra water when you exercise.  Do not do so much that you hurt yourself, feel dizzy, or get very short of breath. Exercises that burn about 150 calories:  Running 1  miles in 15 minutes.  Playing volleyball for 45 to 60 minutes.  Washing and waxing a car for 45 to 60 minutes.  Playing touch football for 45 minutes.  Walking 1  miles in 35 minutes.  Pushing a stroller 1  miles in 30 minutes.  Playing basketball for 30 minutes.  Raking leaves for 30 minutes.  Bicycling 5 miles in 30 minutes.  Walking 2 miles in 30 minutes.  Dancing for 30 minutes.  Shoveling snow for 15 minutes.  Swimming laps for 20 minutes.  Walking up stairs for 15 minutes.  Bicycling 4 miles in 15 minutes.  Gardening for 30 to 45 minutes.  Jumping rope for 15 minutes.  Washing windows or floors for 45 to 60 minutes. Document Released: 06/25/2010 Document Revised: 08/15/2011 Document Reviewed: 06/25/2010 [facility] Patient Information 2015 [place], [place]. This information is not intended to replace advice given to you by your health care provider. Make sure you discuss any questions you have with your health care provider.  Smoking Cessation Quitting smoking is important to your health and has many advantages. However, it is not always  easy to quit since nicotine is a very addictive drug. Oftentimes, people try 3 times or more before being able to quit. This document explains the best ways for you to prepare to quit smoking. Quitting takes hard work and a lot of effort, but you can do it. ADVANTAGES OF QUITTING SMOKING  You will live longer, feel better, and live better.  Your body will feel the impact of quitting smoking almost immediately.  Within 20 minutes, blood pressure decreases. Your pulse returns to its normal level.  After 8 hours, carbon monoxide levels in the blood return to normal. Your oxygen level increases.  After 24  hours, the chance of having a heart attack starts to decrease. Your breath, hair, and body stop smelling like smoke.  After 48 hours, damaged nerve endings begin to recover. Your sense of taste and smell improve.  After 72 hours, the body is virtually free of nicotine. Your bronchial tubes relax and breathing becomes easier.  After 2 to 12 weeks, lungs can hold more air. Exercise becomes easier and circulation improves.  The risk of having a heart attack, stroke, cancer, or lung disease is greatly reduced.  After 1 year, the risk of coronary heart disease is cut in half.  After 5 years, the risk of stroke falls to the same as a nonsmoker.  After 10 years, the risk of lung cancer is cut in half and the risk of other cancers decreases significantly.  After 15 years, the risk of coronary heart disease drops, usually to the level of a nonsmoker.  If you are pregnant, quitting smoking will improve your chances of having a healthy baby.  The people you live with, especially any children, will be healthier.  You will have extra money to spend on things other than cigarettes. QUESTIONS TO THINK ABOUT BEFORE ATTEMPTING TO QUIT You may want to talk about your answers with your health care provider.  Why do you want to quit?  If you tried to quit in the past, what helped and what did not?  What will be the most difficult situations for you after you quit? How will you plan to handle them?  Who can help you through the tough times? Your family? Friends? A health care provider?  What pleasures do you get from smoking? What ways can you still get pleasure if you quit? Here are some questions to ask your health care provider:  How can you help me to be successful at quitting?  What medicine do you think would be best for me and how should I take it?  What should I do if I need more help?  What is smoking withdrawal like? How can I get information on withdrawal? GET READY  Set a quit  date.  Change your environment by getting rid of all cigarettes, ashtrays, matches, and lighters in your home, car, or work. Do not let people smoke in your home.  Review your past attempts to quit. Think about what worked and what did not. GET SUPPORT AND ENCOURAGEMENT You have a better chance of being successful if you have help. You can get support in many ways.  Tell your family, friends, and coworkers that you are going to quit and need their support. Ask them not to smoke around you.  Get individual, group, or telephone counseling and support. Programs are available at [employer] and health centers. Call your local health department for information about programs in your area.  Spiritual beliefs and practices may  help some smokers quit.  Download a "quit meter" on your computer to keep track of quit statistics, such as how long you have gone without smoking, cigarettes not smoked, and money saved.  Get a self-help book about quitting smoking and staying off tobacco. C-Road yourself from urges to smoke. Talk to someone, go for a walk, or occupy your time with a task.  Change your normal routine. Take a different route to work. Drink tea instead of coffee. Eat breakfast in a different place.  Reduce your stress. Take a hot bath, exercise, or read a book.  Plan something enjoyable to do every day. Reward yourself for not smoking.  Explore interactive web-based programs that specialize in helping you quit. GET MEDICINE AND USE IT CORRECTLY Medicines can help you stop smoking and decrease the urge to smoke. Combining medicine with the above behavioral methods and support can greatly increase your chances of successfully quitting smoking.  Nicotine replacement therapy helps deliver nicotine to your body without the negative effects and risks of smoking. Nicotine replacement therapy includes nicotine gum, lozenges, inhalers, nasal sprays, and skin  patches. Some may be available over-the-counter and others require a prescription.  Antidepressant medicine helps people abstain from smoking, but how this works is unknown. This medicine is available by prescription.  Nicotinic receptor partial agonist medicine simulates the effect of nicotine in your brain. This medicine is available by prescription. Ask your health care provider for advice about which medicines to use and how to use them based on your health history. Your health care provider will tell you what side effects to look out for if you choose to be on a medicine or therapy. Carefully read the information on the package. Do not use any other product containing nicotine while using a nicotine replacement product.  RELAPSE OR DIFFICULT SITUATIONS Most relapses occur within the first 3 months after quitting. Do not be discouraged if you start smoking again. Remember, most people try several times before finally quitting. You may have symptoms of withdrawal because your body is used to nicotine. You may crave cigarettes, be irritable, feel very hungry, cough often, get headaches, or have difficulty concentrating. The withdrawal symptoms are only temporary. They are strongest when you first quit, but they will go away within 10-14 days. To reduce the chances of relapse, try to:  Avoid drinking alcohol. Drinking lowers your chances of successfully quitting.  Reduce the amount of caffeine you consume. Once you quit smoking, the amount of caffeine in your body increases and can give you symptoms, such as a rapid heartbeat, sweating, and anxiety.  Avoid smokers because they can make you want to smoke.  Do not let weight gain distract you. Many smokers will gain weight when they quit, usually less than 10 pounds. Eat a healthy diet and stay active. You can always lose the weight gained after you quit.  Find ways to improve your mood other than smoking. FOR MORE INFORMATION  www.smokefree.gov    Document Released: 05/17/2001 Document Revised: 10/07/2013 Document Reviewed: 09/01/2011 [facility] Patient Information 2015 [place], [place]. This information is not intended to replace advice given to you by your health care provider. Make sure you discuss any questions you have with your health care provider.

## 2014-04-09 NOTE — Assessment & Plan Note (Signed)
Rx refill of Wellbutrin XL 150 mg qd  Advised pt f/u with [facility].

## 2014-04-09 NOTE — Assessment & Plan Note (Signed)
Pt will get pap smear and STD screen and f/u at [facility] 04/23/14 at 12:45 pm

## 2014-04-09 NOTE — Assessment & Plan Note (Signed)
BP Readings from Last 3 Encounters:  04/09/14 130/85  01/14/14 130/79  03/08/13 130/83    Lab Results  Component Value Date   NA 135 01/14/2014   K 3.9 01/14/2014   CREATININE 0.75 01/14/2014    Assessment: Blood pressure control: controlled Progress toward BP goal:  at goal Comments: none Plan: Medications:  continue current medications (Zestoretic 20-25 mg qd x 90 d RF x 1) Other plans: f/u in 4-6 months

## 2014-04-09 NOTE — Assessment & Plan Note (Signed)
  Assessment: Progress toward smoking cessation:   (1 pk lasts 2-3 days ) Barriers to progress toward smoking cessation: stress Comments: trying to quit  Plan: Instruction/counseling given:  I counseled patient on the dangers of tobacco use, advised patient to stop smoking, and reviewed strategies to maximize success. Educational resources provided: yes Self management tools provided: no Medications to assist with smoking cessation:  Nicotine Gum Patient agreed to the following self-care plans for smoking cessation: call [facility] (1-800-QUIT-NOW)  Other plans: will try Nicotine gum instead of patch

## 2014-04-09 NOTE — Assessment & Plan Note (Signed)
Framinghams score score 7% will encourage diet and exercise to try to improved lipid panel

## 2014-04-10 ENCOUNTER — Encounter: Payer: Self-pay | Admitting: Internal Medicine

## 2014-04-10 LAB — HIV ANTIBODY (ROUTINE TESTING W REFLEX): HIV: NONREACTIVE

## 2014-04-10 NOTE — Progress Notes (Signed)
Internal Medicine Clinic Attending  Case discussed with Dr. McLean soon after the resident saw the patient.  We reviewed the resident's history and exam and pertinent patient test results.  I agree with the assessment, diagnosis, and plan of care documented in the resident's note. 

## 2014-04-14 ENCOUNTER — Encounter (HOSPITAL_COMMUNITY): Payer: Self-pay | Admitting: *Deleted

## 2014-04-14 ENCOUNTER — Emergency Department (HOSPITAL_COMMUNITY)
Admission: EM | Admit: 2014-04-14 | Discharge: 2014-04-14 | Disposition: A | Discharge status: Home / Self Care | Payer: Medicaid Other | Attending: Emergency Medicine | Admitting: Emergency Medicine

## 2014-04-14 DIAGNOSIS — F329 Major depressive disorder, single episode, unspecified: Secondary | ICD-10-CM | POA: Diagnosis not present

## 2014-04-14 DIAGNOSIS — Z79899 Other long term (current) drug therapy: Secondary | ICD-10-CM | POA: Diagnosis not present

## 2014-04-14 DIAGNOSIS — Z872 Personal history of diseases of the skin and subcutaneous tissue: Secondary | ICD-10-CM | POA: Insufficient documentation

## 2014-04-14 DIAGNOSIS — Z8639 Personal history of other endocrine, nutritional and metabolic disease: Secondary | ICD-10-CM | POA: Insufficient documentation

## 2014-04-14 DIAGNOSIS — Z8742 Personal history of other diseases of the female genital tract: Secondary | ICD-10-CM | POA: Diagnosis not present

## 2014-04-14 DIAGNOSIS — R300 Dysuria: Secondary | ICD-10-CM | POA: Diagnosis present

## 2014-04-14 DIAGNOSIS — I10 Essential (primary) hypertension: Secondary | ICD-10-CM | POA: Insufficient documentation

## 2014-04-14 DIAGNOSIS — N390 Urinary tract infection, site not specified: Secondary | ICD-10-CM | POA: Diagnosis not present

## 2014-04-14 DIAGNOSIS — Z91040 Latex allergy status: Secondary | ICD-10-CM | POA: Insufficient documentation

## 2014-04-14 DIAGNOSIS — Z720 Tobacco use: Secondary | ICD-10-CM | POA: Diagnosis not present

## 2014-04-14 DIAGNOSIS — Z8619 Personal history of other infectious and parasitic diseases: Secondary | ICD-10-CM | POA: Insufficient documentation

## 2014-04-14 DIAGNOSIS — Z8739 Personal history of other diseases of the musculoskeletal system and connective tissue: Secondary | ICD-10-CM | POA: Insufficient documentation

## 2014-04-14 LAB — URINALYSIS, ROUTINE W REFLEX MICROSCOPIC
Bilirubin Urine: NEGATIVE
GLUCOSE, UA: NEGATIVE mg/dL
KETONES UR: NEGATIVE mg/dL
NITRITE: NEGATIVE
Protein, ur: 100 mg/dL — AB
Specific Gravity, Urine: 1.017 (ref 1.005–1.030)
Urobilinogen, UA: 0.2 mg/dL (ref 0.0–1.0)
pH: 6 (ref 5.0–8.0)

## 2014-04-14 LAB — COMPREHENSIVE METABOLIC PANEL
ALBUMIN: 3.1 g/dL — AB (ref 3.5–5.2)
ALT: 15 U/L (ref 0–35)
AST: 13 U/L (ref 0–37)
Alkaline Phosphatase: 74 U/L (ref 39–117)
Anion gap: 12 (ref 5–15)
BUN: 12 mg/dL (ref 6–23)
CALCIUM: 9 mg/dL (ref 8.4–10.5)
CHLORIDE: 104 meq/L (ref 96–112)
CO2: 23 mEq/L (ref 19–32)
CREATININE: 0.72 mg/dL (ref 0.50–1.10)
GFR calc Af Amer: >90 mL/min (ref >90)
GFR calc non Af Amer: >90 mL/min (ref >90)
Glucose, Bld: 93 mg/dL (ref 70–99)
Potassium: 3.9 mEq/L (ref 3.7–5.3)
SODIUM: 139 meq/L (ref 137–147)
Total Bilirubin: 0.9 mg/dL (ref 0.3–1.2)
Total Protein: 6.7 g/dL (ref 6.0–8.3)

## 2014-04-14 LAB — CBC WITH DIFFERENTIAL/PLATELET
BASOS ABS: 0 10*3/uL (ref 0.0–0.1)
BASOS PCT: 0 % (ref 0–1)
Eosinophils Absolute: 0.2 10*3/uL (ref 0.0–0.7)
Eosinophils Relative: 3 % (ref 0–5)
HEMATOCRIT: 40.1 % (ref 36.0–46.0)
Hemoglobin: 13.7 g/dL (ref 12.0–15.0)
Lymphocytes Relative: 23 % (ref 12–46)
Lymphs Abs: 1.4 10*3/uL (ref 0.7–4.0)
MCH: 29.8 pg (ref 26.0–34.0)
MCHC: 34.2 g/dL (ref 30.0–36.0)
MCV: 87.4 fL (ref 78.0–100.0)
MONO ABS: 0.3 10*3/uL (ref 0.1–1.0)
Monocytes Relative: 5 % (ref 3–12)
Neutro Abs: 4.1 10*3/uL (ref 1.7–7.7)
Neutrophils Relative %: 69 % (ref 43–77)
Platelets: 254 10*3/uL (ref 150–400)
RBC: 4.59 MIL/uL (ref 3.87–5.11)
RDW: 14.3 % (ref 11.5–15.5)
WBC: 6 10*3/uL (ref 4.0–10.5)

## 2014-04-14 LAB — URINE MICROSCOPIC-ADD ON

## 2014-04-14 MED ORDER — CEPHALEXIN 500 MG PO CAPS: 500.0000 mg | ORAL_CAPSULE | Freq: Four times a day (QID) | ORAL | Status: DC

## 2014-04-14 MED ORDER — CEFTRIAXONE SODIUM 1 G IJ SOLR
1.0000 g | Freq: Once | INTRAMUSCULAR | Status: AC
  Administered 2014-04-14: 1 g via INTRAVENOUS
  Filled 2014-04-14: qty 10

## 2014-04-14 NOTE — ED Notes (Signed)
Pt c/o dysuria since Sat.   Also c/o bil flank pain that radiates to lower abdomen.  Denies fevers, but c/o some nausea.

## 2014-04-15 LAB — URINE CULTURE: Colony Count: >=100000

## 2014-04-16 ENCOUNTER — Telehealth (HOSPITAL_BASED_OUTPATIENT_CLINIC_OR_DEPARTMENT_OTHER): Payer: Self-pay | Admitting: Emergency Medicine
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
## 2014-04-16 NOTE — Telephone Encounter (Signed)
Post ED Visit - Positive Culture Follow-up  Culture report reviewed by antimicrobial stewardship pharmacist: []  Wes Dulaney, Pharm.D., BCPS []  Heide Guile, Pharm.D., BCPS []  Alycia Rossetti, Pharm.D., BCPS [x]  Steward, Pharm.D., BCPS, AAHIVP []  Legrand Como, Pharm.D., BCPS, AAHIVP []  Elicia Lamp, Pharm.D.   Positive urine culture proteus  Treated with cephalexin, organism sensitive to the same and no further patient follow-up is required at this time.  Hazle Nordmann 04/16/2014, 4:11 PM

## 2014-04-21 ENCOUNTER — Ambulatory Visit (HOSPITAL_COMMUNITY)
Admission: RE | Admit: 2014-04-21 | Discharge: 2014-04-21 | Disposition: A | Discharge status: Home / Self Care | Payer: Medicaid Other | Source: Ambulatory Visit | Attending: Internal Medicine | Admitting: Internal Medicine

## 2014-04-21 DIAGNOSIS — Z1231 Encounter for screening mammogram for malignant neoplasm of breast: Secondary | ICD-10-CM | POA: Diagnosis present

## 2014-04-23 ENCOUNTER — Other Ambulatory Visit (HOSPITAL_COMMUNITY)
Admission: RE | Admit: 2014-04-23 | Discharge: 2014-04-23 | Disposition: A | Discharge status: Home / Self Care | Payer: Medicaid Other | Source: Ambulatory Visit | Attending: Obstetrics & Gynecology | Admitting: Obstetrics & Gynecology

## 2014-04-23 ENCOUNTER — Ambulatory Visit (INDEPENDENT_AMBULATORY_CARE_PROVIDER_SITE_OTHER): Payer: Medicaid Other | Admitting: Obstetrics & Gynecology

## 2014-04-23 ENCOUNTER — Encounter: Payer: Self-pay | Admitting: Obstetrics & Gynecology

## 2014-04-23 VITALS — BP 134/81 | HR 54 | Temp 98.0°F | Resp 20 | Ht 66.0 in | Wt 312.1 lb

## 2014-04-23 DIAGNOSIS — Z1151 Encounter for screening for human papillomavirus (HPV): Secondary | ICD-10-CM | POA: Diagnosis present

## 2014-04-23 DIAGNOSIS — A630 Anogenital (venereal) warts: Secondary | ICD-10-CM

## 2014-04-23 DIAGNOSIS — Z01419 Encounter for gynecological examination (general) (routine) without abnormal findings: Secondary | ICD-10-CM

## 2014-04-23 NOTE — Progress Notes (Signed)
Subjective:     Rebekah Howard is a 53 y.o. female here for a routine exam.  Current complaints: Pt reports very extensive condyloma.  She reports that after she was treated with cryo the lesions peeled off but, returned.  She has a new partner for 1 month who has herpes.  Pt was not using protection because she thought herpes and HPV were the same thing.  She reports bleeding that is irregular.  She skipped OCT and has had spotting this month.  She reports hot flushes and mood changes.  She is on anti-depressants already.  She is not interested in new meds.  Her mohter went through menopause at age 51.      Gynecologic History Patient's last menstrual period was 04/16/2014 (exact date). Contraception: none Last Pap: 2011. Results were: normal Last mammogram: 04/21/2014. Results were: normal  Obstetric History OB History  Gravida Para Term Preterm AB SAB TAB Ectopic Multiple Living  3 3 3       3     # Outcome Date GA Lbr Len/2nd Weight Sex Delivery Anes PTL Lv  3 Term           2 Term           1 Term                The following portions of the patient's history were reviewed and updated as appropriate: allergies, current medications, past family history, past medical history, past social history, past surgical history and problem list.  Review of Systems A comprehensive review of systems was negative.    Objective:    BP 134/81 mmHg  Pulse 54  Temp(Src) 98 F (36.7 C) (Oral)  Resp 20  Ht 5\' 6"  (1.676 m)  Wt 312 lb 1.6 oz (141.568 kg)  BMI 50.40 kg/m2  LMP 04/16/2014 (Exact Date)  General Appearance:    Alert, cooperative, no distress, appears stated age  Head:    Normocephalic, without obvious abnormality, atraumatic  Eyes:    PERRL, conjunctiva/corneas clear, EOM's intact, fundi    benign, both eyes  Ears:    Normal TM's and external ear canals, both ears  Nose:   Nares normal, septum midline, mucosa normal, no drainage    or sinus tenderness  Throat:   Lips,  mucosa, and tongue normal; teeth and gums normal  Neck:   Supple, symmetrical, trachea midline, no adenopathy;    thyroid:  no enlargement/tenderness/nodules; no carotid   bruit or JVD  Back:     Symmetric, no curvature, ROM normal, no CVA tenderness  Lungs:     Clear to auscultation bilaterally, respirations unlabored  Chest Wall:    No tenderness or deformity   Heart:    Regular rate and rhythm, S1 and S2 normal, no murmur, rub   or gallop  Breast Exam:    No tenderness, masses, or nipple abnormality  Abdomen:     Soft, non-tender, bowel sounds active all four quadrants,    no masses, no organomegaly; multiple well healed  Scars.   Genitalia:    Normal female without lesion, discharge or tenderness; extensive condyloma extending into anus and is bilateral     Extremities:   Extremities normal, atraumatic, no cyanosis or edema  Pulses:   2+ and symmetric all extremities  Skin:   Skin color, texture, turgor normal, no rashes or lesions  Lymph nodes:   Cervical, supraclavicular, and axillary nodes normal         Assessment:  Healthy female exam.   Extensive condyloma- pt desires resection in the OR Reviewed with pt the difference between HPV and HSV- recommended condoms at all times!    Plan:    Follow up in: 1 year. condoms at all times    Patient desires surgical management with resection of condyloma.  The risks of surgery were discussed in detail with the patient including but not limited to: bleeding which may require transfusion or reoperation; infection which may require prolonged hospitalization or re-hospitalization and chronic pain and recurrence. Patient was told that the likelihood that her condition and symptoms will be treated effectively with this surgical management was very high; the postoperative expectations were also discussed in detail. The patient also understands the alternative treatment options which were discussed in full. All questions were answered.  She  was told that she will be contacted by our surgical scheduler regarding the time and date of her surgery; routine preoperative instructions of having nothing to eat or drink after midnight on the day prior to surgery and also coming to the hospital 1 1/2 hours prior to her time of surgery were also emphasized.  She was told she may be called for a preoperative appointment about a week prior to surgery and will be given further preoperative instructions at that visit. Printed patient education handouts about the procedure were given to the patient to review at home.

## 2014-04-23 NOTE — Patient Instructions (Signed)
Genital Herpes Genital herpes is a sexually transmitted disease. This means that it is a disease passed by having sex with an infected person. There is no cure for genital herpes. The time between attacks can be months to years. The virus may live in a person but produce no problems (symptoms). This infection can be passed to a baby as it travels down the birth canal (vagina). In a newborn, this can cause central nervous system damage, eye damage, or even death. The virus that causes genital herpes is usually HSV-2 virus. The virus that causes oral herpes is usually HSV-1. The diagnosis (learning what is wrong) is made through culture results. SYMPTOMS  Usually symptoms of pain and itching begin a few days to a week after contact. It first appears as small blisters that progress to small painful ulcers which then scab over and heal after several days. It affects the outer genitalia, birth canal, cervix, penis, anal area, buttocks, and thighs. HOME CARE INSTRUCTIONS   Keep ulcerated areas dry and clean.  Take medications as directed. Antiviral medications can speed up healing. They will not prevent recurrences or cure this infection. These medications can also be taken for suppression if there are frequent recurrences.  While the infection is active, it is contagious. Avoid all sexual contact during active infections.  Condoms may help prevent spread of the herpes virus.  Practice safe sex.  Wash your hands thoroughly after touching the genital area.  Avoid touching your eyes after touching your genital area.  Inform your caregiver if you have had genital herpes and become pregnant. It is your responsibility to insure a safe outcome for your baby in this pregnancy.  Only take over-the-counter or prescription medicines for pain, discomfort, or fever as directed by your caregiver. SEEK MEDICAL CARE IF:   You have a recurrence of this infection.  You do not respond to medications and are not  improving.  You have new sources of pain or discharge which have changed from the original infection.  You have an oral temperature above 102 F (38.9 C).  You develop abdominal pain.  You develop eye pain or signs of eye infection. Document Released: 05/20/2000 Document Revised: 08/15/2011 Document Reviewed: 06/10/2009 [employer] Patient Information 2015 [employer]. This information is not intended to replace advice given to you by your health care provider. Make sure you discuss any questions you have with your health care provider. Genital Warts Genital warts are a sexually transmitted infection. They may appear as small bumps on the tissues of the genital area. CAUSES  Genital warts are caused by a virus called human papillomavirus (HPV). HPV is the most common sexually transmitted disease (STD) and infection of the sex organs. This infection is spread by having unprotected sex with an infected person. It can be spread by vaginal, anal, and oral sex. Many people do not know they are infected. They may be infected for years without problems. However, even if they do not have problems, they can unknowingly pass the infection to their sexual partners. SYMPTOMS   Itching and irritation in the genital area.  Warts that bleed.  Painful sexual intercourse. DIAGNOSIS  Warts are usually recognized with the naked eye on the vagina, vulva, perineum, anus, and rectum. Certain tests can also diagnose genital warts, such as:  A Pap test.  A tissue sample (biopsy) exam.  Colposcopy. A magnifying tool is used to examine the vagina and cervix. The HPV cells will change color when certain solutions are used.   TREATMENT  Warts can be removed by:  Applying certain chemicals, such as cantharidin or podophyllin.  Liquid nitrogen freezing (cryotherapy).  Immunotherapy with Candida or Trichophyton injections.  Laser treatment.  Burning with an electrified probe (electrocautery).  Interferon  injections.  Surgery. PREVENTION  HPV vaccination can help prevent HPV infections that cause genital warts and that cause cancer of the cervix. It is recommended that the vaccination be given to people between the ages 9 to 26 years old. The vaccine might not work as well or might not work at all if you already have HPV. It should not be given to pregnant women. HOME CARE INSTRUCTIONS   It is important to follow your caregiver's instructions. The warts will not go away without treatment. Repeat treatments are often needed to get rid of warts. Even after it appears that the warts are gone, the normal tissue underneath often remains infected.  Do not try to treat genital warts with medicine used to treat hand warts. This type of medicine is strong and can burn the skin in the genital area, causing more damage.  Tell your past and current sexual partner(s) that you have genital warts. They may be infected also and need treatment.  Avoid sexual contact while being treated.  Do not touch or scratch the warts. The infection may spread to other parts of your body.  Women with genital warts should have a cervical cancer check (Pap test) at least once a year. This type of cancer is slow-growing and can be cured if found early. Chances of developing cervical cancer are increased with HPV.  Inform your obstetrician about your warts in the event of pregnancy. This virus can be passed to the baby's respiratory tract. Discuss this with your caregiver.  Use a condom during sexual intercourse. Following treatment, the use of condoms will help prevent reinfection.  Ask your caregiver about using over-the-counter anti-itch creams. SEEK MEDICAL CARE IF:   Your treated skin becomes red, swollen, or painful.  You have a fever.  You feel generally ill.  You feel little lumps in and around your genital area.  You are bleeding or have painful sexual intercourse. MAKE SURE YOU:   Understand these  instructions.  Will watch your condition.  Will get help right away if you are not doing well or get worse. Document Released: 05/20/2000 Document Revised: 10/07/2013 Document Reviewed: 11/29/2010 [employer] Patient Information 2015 [employer]. This information is not intended to replace advice given to you by your health care provider. Make sure you discuss any questions you have with your health care provider.   

## 2014-04-23 NOTE — Progress Notes (Signed)
Pt states she is here for a Pap smear and having problems with her menstrual cycle. She also would like treatment of her genital warts.

## 2014-04-24 LAB — CYTOLOGY - PAP

## 2014-05-08 NOTE — Patient Instructions (Signed)
   Your procedure is scheduled on: Tuesday, Dec 8  Enter through the [employer] [facility] at: 8:20 AM Pick up the phone at the desk and dial [phone] and inform us of your arrival.  Please call this number if you have any problems the morning of surgery: [phone]  Remember: Do not eat or drink after midnight: Monday Take these medicines the morning of surgery with a SIP OF WATER:  Do not wear jewelry, make-up, or FINGER nail polish No metal in your hair or on your body. Do not wear lotions, powders, perfumes.  You may wear deodorant.  Do not bring valuables to the hospital. Contacts, dentures or bridgework may not be worn into surgery.  Patients discharged on the day of surgery will not be allowed to drive home.

## 2014-05-12 ENCOUNTER — Encounter (HOSPITAL_COMMUNITY): Payer: Self-pay

## 2014-05-12 ENCOUNTER — Other Ambulatory Visit: Payer: Self-pay

## 2014-05-12 ENCOUNTER — Encounter (HOSPITAL_COMMUNITY)
Admission: RE | Admit: 2014-05-12 | Discharge: 2014-05-12 | Disposition: A | Discharge status: Home / Self Care | Payer: Medicaid Other | Source: Ambulatory Visit | Attending: Obstetrics & Gynecology | Admitting: Obstetrics & Gynecology

## 2014-05-12 DIAGNOSIS — A630 Anogenital (venereal) warts: Secondary | ICD-10-CM | POA: Diagnosis present

## 2014-05-12 DIAGNOSIS — Z720 Tobacco use: Secondary | ICD-10-CM | POA: Diagnosis not present

## 2014-05-12 HISTORY — DX: Inflammatory liver disease, unspecified: K75.9

## 2014-05-12 LAB — BASIC METABOLIC PANEL
Anion gap: 12 (ref 5–15)
BUN: 12 mg/dL (ref 6–23)
CALCIUM: 9.5 mg/dL (ref 8.4–10.5)
CO2: 26 mEq/L (ref 19–32)
Chloride: 99 mEq/L (ref 96–112)
Creatinine, Ser: 0.8 mg/dL (ref 0.50–1.10)
GFR calc Af Amer: >90 mL/min (ref >90)
GFR, EST NON AFRICAN AMERICAN: 83 mL/min — AB (ref >90)
GLUCOSE: 93 mg/dL (ref 70–99)
Potassium: 3.8 mEq/L (ref 3.7–5.3)
SODIUM: 137 meq/L (ref 137–147)

## 2014-05-12 LAB — CBC
HCT: 41.7 % (ref 36.0–46.0)
HEMOGLOBIN: 14.4 g/dL (ref 12.0–15.0)
MCH: 30.4 pg (ref 26.0–34.0)
MCHC: 34.5 g/dL (ref 30.0–36.0)
MCV: 88.2 fL (ref 78.0–100.0)
PLATELETS: 242 10*3/uL (ref 150–400)
RBC: 4.73 MIL/uL (ref 3.87–5.11)
RDW: 14.1 % (ref 11.5–15.5)
WBC: 6.1 10*3/uL (ref 4.0–10.5)

## 2014-05-13 ENCOUNTER — Encounter (HOSPITAL_COMMUNITY): Payer: Self-pay

## 2014-05-13 ENCOUNTER — Ambulatory Visit (HOSPITAL_COMMUNITY): Payer: Medicaid Other | Admitting: Certified Registered Nurse Anesthetist

## 2014-05-13 ENCOUNTER — Encounter: Payer: Self-pay | Admitting: Obstetrics & Gynecology

## 2014-05-13 ENCOUNTER — Encounter (HOSPITAL_COMMUNITY)
Admission: RE | Disposition: A | Discharge status: Home / Self Care | Payer: Self-pay | Source: Ambulatory Visit | Attending: Obstetrics & Gynecology

## 2014-05-13 ENCOUNTER — Ambulatory Visit (HOSPITAL_COMMUNITY)
Admission: RE | Admit: 2014-05-13 | Discharge: 2014-05-13 | Disposition: A | Discharge status: Home / Self Care | Payer: Medicaid Other | Source: Ambulatory Visit | Attending: Obstetrics & Gynecology | Admitting: Obstetrics & Gynecology

## 2014-05-13 DIAGNOSIS — Z720 Tobacco use: Secondary | ICD-10-CM | POA: Insufficient documentation

## 2014-05-13 DIAGNOSIS — A630 Anogenital (venereal) warts: Secondary | ICD-10-CM | POA: Diagnosis not present

## 2014-05-13 HISTORY — PX: VULVAR LESION REMOVAL: SHX5391

## 2014-05-13 SURGERY — VULVAR LESION
Anesthesia: Spinal | Site: Vulva | Laterality: Bilateral

## 2014-05-13 MED ORDER — PROPOFOL 10 MG/ML IV EMUL
INTRAVENOUS | Status: AC
  Filled 2014-05-13: qty 50

## 2014-05-13 MED ORDER — MIDAZOLAM HCL 2 MG/2ML IJ SOLN
INTRAMUSCULAR | Status: AC
  Filled 2014-05-13: qty 2

## 2014-05-13 MED ORDER — MIDAZOLAM HCL 2 MG/2ML IJ SOLN
INTRAMUSCULAR | Status: DC | PRN
  Administered 2014-05-13: 2 mg via INTRAVENOUS

## 2014-05-13 MED ORDER — SILVER SULFADIAZINE 1 % EX CREA
TOPICAL_CREAM | CUTANEOUS | Status: DC | PRN
Start: 2014-05-13 — End: 2014-05-13
  Administered 2014-05-13: 1 via TOPICAL

## 2014-05-13 MED ORDER — BUPIVACAINE HCL (PF) 0.5 % IJ SOLN
INTRAMUSCULAR | Status: AC
  Filled 2014-05-13: qty 30

## 2014-05-13 MED ORDER — SODIUM CHLORIDE 4 MEQ/ML IV SOLN: 1.0000 "application " | Freq: Four times a day (QID) | ORAL | Status: DC | PRN

## 2014-05-13 MED ORDER — SCOPOLAMINE 1 MG/3DAYS TD PT72
1.0000 | MEDICATED_PATCH | Freq: Once | TRANSDERMAL | Status: AC
  Administered 2014-05-13: 1.5 mg via TRANSDERMAL
  Administered 2014-05-13: 1 via TRANSDERMAL

## 2014-05-13 MED ORDER — BUPIVACAINE LIPOSOME 1.3 % IJ SUSP
20.0000 mL | Freq: Once | INTRAMUSCULAR | Status: DC
  Filled 2014-05-13: qty 20

## 2014-05-13 MED ORDER — LACTATED RINGERS IV SOLN
INTRAVENOUS | Status: DC
Start: 2014-05-13 — End: 2014-05-13
  Administered 2014-05-13: 125 mL/h via INTRAVENOUS
  Administered 2014-05-13: 11:00:00 via INTRAVENOUS

## 2014-05-13 MED ORDER — PROPOFOL INFUSION 10 MG/ML OPTIME
INTRAVENOUS | Status: DC | PRN
  Administered 2014-05-13: 30 ug/kg/min via INTRAVENOUS

## 2014-05-13 MED ORDER — SILVER SULFADIAZINE 1 % EX CREA: TOPICAL_CREAM | Freq: Two times a day (BID) | CUTANEOUS | Status: DC

## 2014-05-13 MED ORDER — ACETAMINOPHEN 160 MG/5ML PO SOLN: 960.0000 mg | Freq: Once | ORAL | Status: DC

## 2014-05-13 MED ORDER — SILVER SULFADIAZINE 1 % EX CREA
TOPICAL_CREAM | CUTANEOUS | Status: AC
  Filled 2014-05-13: qty 50

## 2014-05-13 MED ORDER — FENTANYL CITRATE 0.05 MG/ML IJ SOLN
INTRAMUSCULAR | Status: DC | PRN
  Administered 2014-05-13 (×2): 50 ug via INTRAVENOUS

## 2014-05-13 MED ORDER — FENTANYL CITRATE 0.05 MG/ML IJ SOLN
INTRAMUSCULAR | Status: AC
  Filled 2014-05-13: qty 2

## 2014-05-13 MED ORDER — FENTANYL CITRATE 0.05 MG/ML IJ SOLN: 25.0000 ug | INTRAMUSCULAR | Status: DC | PRN

## 2014-05-13 MED ORDER — SCOPOLAMINE 1 MG/3DAYS TD PT72
MEDICATED_PATCH | TRANSDERMAL | Status: AC
  Administered 2014-05-13: 1.5 mg via TRANSDERMAL
  Filled 2014-05-13: qty 1

## 2014-05-13 MED ORDER — OXYCODONE-ACETAMINOPHEN 5-325 MG PO TABS: 1.0000 | ORAL_TABLET | Freq: Four times a day (QID) | ORAL | Status: DC | PRN

## 2014-05-13 MED ORDER — LACTATED RINGERS IV SOLN: INTRAVENOUS | Status: DC

## 2014-05-13 SURGICAL SUPPLY — 24 items
BLADE SURG 15 STRL LF C SS BP (BLADE) ×1 IMPLANT
BLADE SURG 15 STRL SS (BLADE) ×3
CANISTER SUCT 3000ML (MISCELLANEOUS) ×2 IMPLANT
CATH SILICONE 16FRX5CC (CATHETERS) ×2 IMPLANT
CLOTH BEACON ORANGE TIMEOUT ST (SAFETY) ×3 IMPLANT
COUNTER NEEDLE 1200 MAGNETIC (NEEDLE) ×3 IMPLANT
DECANTER SPIKE VIAL GLASS SM (MISCELLANEOUS) ×3 IMPLANT
ELECT NDL BLADE 2-5/6 (NEEDLE) IMPLANT
ELECT NEEDLE BLADE 2-5/6 (NEEDLE) ×3 IMPLANT
ELECT REM PT RETURN 9FT ADLT (ELECTROSURGICAL) ×3
ELECTRODE REM PT RTRN 9FT ADLT (ELECTROSURGICAL) ×1 IMPLANT
GLOVE BIO SURGEON STRL SZ7 (GLOVE) ×3 IMPLANT
GLOVE BIOGEL PI IND STRL 7.0 (GLOVE) ×1 IMPLANT
GLOVE BIOGEL PI INDICATOR 7.0 (GLOVE) ×2
GOWN STRL REUS W/TWL LRG LVL3 (GOWN DISPOSABLE) ×6 IMPLANT
PACK VAGINAL MINOR WOMEN LF (CUSTOM PROCEDURE TRAY) ×3 IMPLANT
PAD OB MATERNITY 4.3X12.25 (PERSONAL CARE ITEMS) ×3 IMPLANT
PENCIL BUTTON HOLSTER BLD 10FT (ELECTRODE) IMPLANT
SUT VIC AB 3-0 SH 27 (SUTURE)
SUT VIC AB 3-0 SH 27X BRD (SUTURE) IMPLANT
TOWEL OR 17X24 6PK STRL BLUE (TOWEL DISPOSABLE) ×6 IMPLANT
TUBING SUCTION BULK 100 FT (MISCELLANEOUS) ×2 IMPLANT
WATER STERILE IRR 1000ML POUR (IV SOLUTION) ×3 IMPLANT
YANKAUER SUCT BULB TIP NO VENT (SUCTIONS) ×2 IMPLANT

## 2014-05-13 NOTE — Brief Op Note (Signed)
05/13/2014  11:36 AM  PATIENT:  [name]  53 y.o. 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   Delay start of Pharmacological VTE agent (>24hrs) due to surgical blood loss or risk of bleeding: not applicable

## 2014-05-13 NOTE — Op Note (Signed)
05/13/2014  11:36 AM  PATIENT:  Rebekah Howard  53 y.o. 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   Delay start of Pharmacological VTE agent (>24hrs) due to surgical blood loss or risk of bleeding: not applicable  Complications: none immediate  The pt is a 53 yo AA female with a long term history of condyloma.  The risks and alternatives of the procedure were reviewed with the patient in the office and again at the hospital prior to surgery. Informed consent was obtained.    The patient was taken to the operating room where she was prepped and draped in the usual sterile fashion in the dorsal lithotomy position.  Attention was directed to the extensive condyloma.  There were notably 2 large lesions on the patients left side and 2-3 large lesions on the patients right side.  There were also lesions x 3 extending into the anal canal.  Some of the lesions were pedunculated.  Using the needle tip cautery.  All of the lesions were resected from the base of the lesion.  There was minimal bleeding noted.  After removal of all of the lesions, the lesions were covered with Silvadene cream.   The patient tolerated the procedure well.  She was take to the recovery room in awake and in stable  condition. She is to continue to Silvadene cream at home.  Lakeasha Petion L. Harraway-Smith, M.D., Cherlynn June

## 2014-05-13 NOTE — Transfer of Care (Signed)
Immediate Anesthesia Transfer of Care Note  Patient: Rebekah Howard  Procedure(s) Performed: Procedure(s): VULVAR LESION (Bilateral)  Patient Location: PACU  Anesthesia Type:Spinal  Level of Consciousness: awake, alert  and oriented  Airway & Oxygen Therapy: Patient Spontanous Breathing and Patient connected to nasal cannula oxygen  Post-op Assessment: Report given to PACU RN and Post -op Vital signs reviewed and stable  Post vital signs: Reviewed and stable  Complications: No apparent anesthesia complications

## 2014-05-13 NOTE — Anesthesia Procedure Notes (Signed)
Spinal Patient location during procedure: OR Preanesthetic Checklist Completed: patient identified, site marked, surgical consent, pre-op evaluation, timeout performed, IV checked, risks and benefits discussed and monitors and equipment checked Spinal Block Patient position: sitting Prep: DuraPrep Patient monitoring: heart rate, cardiac monitor, continuous pulse ox and blood pressure Approach: midline Location: L3-4 Injection technique: single-shot Needle Needle type: Sprotte  Needle gauge: 24 G Needle length: 9 cm Assessment Sensory level: T12 Additional Notes Touhy to 9cm LOR Sprotte thru touhy; 1.0 cc Bupiv- heavy .75%

## 2014-05-13 NOTE — Anesthesia Preprocedure Evaluation (Signed)
Anesthesia Evaluation  Patient identified by MRN, date of birth, ID band Patient awake    Reviewed: Allergy & Precautions, H&P , Patient's Chart, lab work & pertinent test results  Airway Mallampati: II  TM Distance: >3 FB Neck ROM: full    Dental no notable dental hx.    Pulmonary Current Smoker,  breath sounds clear to auscultation  Pulmonary exam normal       Cardiovascular Exercise Tolerance: Good hypertension, On Medications Rhythm:regular Rate:Normal     Neuro/Psych    GI/Hepatic   Endo/Other  Morbid obesity  Renal/GU      Musculoskeletal   Abdominal   Peds  Hematology   Anesthesia Other Findings Offered Regional and GA. Pt opts for SAB  Reproductive/Obstetrics                             Anesthesia Physical Anesthesia Plan  ASA: III  Anesthesia Plan: Spinal   Post-op Pain Management:    Induction:   Airway Management Planned:   Additional Equipment:   Intra-op Plan:   Post-operative Plan:   Informed Consent: I have reviewed the patients History and Physical, chart, labs and discussed the procedure including the risks, benefits and alternatives for the proposed anesthesia with the patient or authorized representative who has indicated his/her understanding and acceptance.   Dental Advisory Given  Plan Discussed with: CRNA  Anesthesia Plan Comments: (Lab work confirmed with CRNA in room. Platelets okay. Discussed spinal anesthetic, and patient consents to the procedure:  included risk of possible headache,backache, failed block, allergic reaction, and nerve injury. This patient was asked if she had any questions or concerns before the procedure started. )        Anesthesia Quick Evaluation

## 2014-05-13 NOTE — Discharge Instructions (Signed)
DISCHARGE INSTRUCTIONS: Vulvar Lesion Removal The following instructions have been prepared to help you care for yourself upon your return home.   Personal hygiene:  Use sanitary pads for vaginal drainage, not tampons.  Shower the day after your procedure.  NO tub baths, pools or Jacuzzis for 2-3 weeks.  Wipe front to back after using the bathroom.  Activity and limitations:  Do NOT drive or operate any equipment for 24 hours. The effects of anesthesia are still present and drowsiness may result.  Do NOT rest in bed all day.  Walking is encouraged.  Walk up and down stairs slowly.  You may resume your normal activity in one to two days or as indicated by your physician.  Sexual activity: NO intercourse for at least 2 weeks after the procedure, or as indicated by your physician.  Diet: Eat a light meal as desired this evening. You may resume your usual diet tomorrow.  Return to work: You may resume your work activities in one to two days or as indicated by your doctor.  What to expect after your surgery: Expect to have vaginal bleeding/discharge for 2-3 days and spotting for up to 10 days. It is not unusual to have soreness for up to 1-2 weeks. You may have a slight burning sensation when you urinate for the first day. Mild cramps may continue for a couple of days. You may have a regular period in 2-6 weeks.  Call your doctor for any of the following:  Excessive vaginal bleeding, saturating and changing one pad every hour.  Inability to urinate 6 hours after discharge from hospital.  Pain not relieved by pain medication.  Fever of 100.4 F or greater.  Unusual vaginal discharge or odor.   Call for an appointment:    Patients signature: ______________________  Nurses signature ________________________  Support person's signature_______________________   Genital Warts Genital warts are a sexually transmitted infection. They may appear as small bumps on the  tissues of the genital area. CAUSES  Genital warts are caused by a virus called human papillomavirus (HPV). HPV is the most common sexually transmitted disease (STD) and infection of the sex organs. This infection is spread by having unprotected sex with an infected person. It can be spread by vaginal, anal, and oral sex. Many people do not know they are infected. They may be infected for years without problems. However, even if they do not have problems, they can unknowingly pass the infection to their sexual partners.Burn Care Your skin is a natural barrier to infection. It is the largest organ of your body. Burns damage this natural protection. To help prevent infection, it is very important to follow your caregiver's instructions in the care of your burn. Burns are classified as:  First degree. There is only redness of the skin (erythema). No scarring is expected.  Second degree. There is blistering of the skin. Scarring may occur with deeper burns.  Third degree. All layers of the skin are injured, and scarring is expected. HOME CARE INSTRUCTIONS   Wash your hands well before changing your bandage.  Change your bandage as often as directed by your caregiver.  Remove the old bandage. If the bandage sticks, you may soak it off with cool, clean water.  Cleanse the burn thoroughly but gently with mild soap and water.  Pat the area dry with a clean, dry cloth.  Apply a thin layer of antibacterial cream to the burn.  Apply a clean bandage as instructed by your caregiver.  Keep the bandage as clean and dry as possible.  Elevate the affected area for the first 24 hours, then as instructed by your caregiver.  Only take over-the-counter or prescription medicines for pain, discomfort, or fever as directed by your caregiver. SEEK IMMEDIATE MEDICAL CARE IF:   You develop excessive pain.  You develop redness, tenderness, swelling, or red streaks near the burn.  The burned area develops  yellowish-white fluid (pus) or a bad smell.  You have a fever. MAKE SURE YOU:   Understand these instructions.  Will watch your condition.  Will get help right away if you are not doing well or get worse. Document Released: 05/23/2005 Document Revised: 08/15/2011 Document Reviewed: 10/13/2010 [facility] Patient Information 2015 [place], [place]. This information is not intended to replace advice given to you by your health care provider. Make sure you discuss any questions you have with your health care provider.  SYMPTOMS   Itching and irritation in the genital area.  Warts that bleed.  Painful sexual intercourse. DIAGNOSIS  Warts are usually recognized with the naked eye on the vagina, vulva, perineum, anus, and rectum. Certain tests can also diagnose genital warts, such as:  A Pap test.  A tissue sample (biopsy) exam.  Colposcopy. A magnifying tool is used to examine the vagina and cervix. The HPV cells will change color when certain solutions are used. TREATMENT  Warts can be removed by:  Applying certain chemicals, such as cantharidin or podophyllin.  Liquid nitrogen freezing (cryotherapy).  Immunotherapy with Candida or Trichophyton injections.  Laser treatment.  Burning with an electrified probe (electrocautery).  Interferon injections.  Surgery. PREVENTION  HPV vaccination can help prevent HPV infections that cause genital warts and that cause cancer of the cervix. It is recommended that the vaccination be given to people between the ages 60 to 58 years old. The vaccine might not work as well or might not work at all if you already have HPV. It should not be given to pregnant women. HOME CARE INSTRUCTIONS   It is important to follow your caregiver's instructions. The warts will not go away without treatment. Repeat treatments are often needed to get rid of warts. Even after it appears that the warts are gone, the normal tissue underneath often remains  infected.  Do not try to treat genital warts with medicine used to treat hand warts. This type of medicine is strong and can burn the skin in the genital area, causing more damage.  Tell your past and current sexual partner(s) that you have genital warts. They may be infected also and need treatment.  Avoid sexual contact while being treated.  Do not touch or scratch the warts. The infection may spread to other parts of your body.  Women with genital warts should have a cervical cancer check (Pap test) at least once a year. This type of cancer is slow-growing and can be cured if found early. Chances of developing cervical cancer are increased with HPV.  Inform your obstetrician about your warts in the event of pregnancy. This virus can be passed to the baby's respiratory tract. Discuss this with your caregiver.  Use a condom during sexual intercourse. Following treatment, the use of condoms will help prevent reinfection.  Ask your caregiver about using over-the-counter anti-itch creams. SEEK MEDICAL CARE IF:   Your treated skin becomes red, swollen, or painful.  You have a fever.  You feel generally ill.  You feel little lumps in and around your genital area.  You are  bleeding or have painful sexual intercourse. MAKE SURE YOU:   Understand these instructions.  Will watch your condition.  Will get help right away if you are not doing well or get worse. Document Released: 05/20/2000 Document Revised: 10/07/2013 Document Reviewed: 11/29/2010 [facility] Patient Information 2015 [place], [place]. This information is not intended to replace advice given to you by your health care provider. Make sure you discuss any questions you have with your health care provider.

## 2014-05-13 NOTE — Anesthesia Postprocedure Evaluation (Signed)
  Anesthesia Post-op Note  Anesthesia Post Note  Patient: Rebekah Howard  Procedure(s) Performed: Procedure(s) (LRB): VULVAR LESION (Bilateral)  Anesthesia type: Spinal  Patient location: PACU  Post pain: Pain level controlled  Post assessment: Post-op Vital signs reviewed  Last Vitals:  Filed Vitals:   05/13/14 1415  BP: 120/63  Pulse: 72  Temp:   Resp:     Post vital signs: Reviewed  Level of consciousness: awake  Complications: No apparent anesthesia complications

## 2014-05-13 NOTE — H&P (Signed)
Rebekah Howard is a 53 y.o. female here for a routine exam. Current complaints: Pt reports very extensive condyloma. She reports that after she was treated with cryo the lesions peeled off but, returned. She has a new partner for 1 month who has herpes. Pt was not using protection because she thought herpes and HPV were the same thing. She reports bleeding that is irregular. She skipped OCT and has had spotting this month. She reports hot flushes and mood changes. She is on anti-depressants already. She is not interested in new meds. Her mohter went through menopause at age 75.    Gynecologic History Patient's last menstrual period was 04/16/2014 (exact date). Contraception: none Last Pap: 2011. Results were: normal Last mammogram: 04/21/2014. Results were: normal  Obstetric History OB History  Gravida Para Term Preterm AB SAB TAB Ectopic Multiple Living  3 3 3       3     # Outcome Date GA Lbr Len/2nd Weight Sex Delivery Anes PTL Lv  3 Term           2 Term           1 Term                The following portions of the patient's history were reviewed and updated as appropriate: allergies, current medications, past family history, past medical history, past social history, past surgical history and problem list.  Review of Systems A comprehensive review of systems was negative.   Objective:    BP 134/81 mmHg  Pulse 54  Temp(Src) 98 F (36.7 C) (Oral)  Resp 20  Ht 5\' 6"  (1.676 m)  Wt 312 lb 1.6 oz (141.568 kg)  BMI 50.40 kg/m2  LMP 04/16/2014 (Exact Date)  General Appearance:   Alert, cooperative, no distress, appears stated age  Head:   Normocephalic, without obvious abnormality, atraumatic  Eyes:   PERRL, conjunctiva/corneas clear, EOM's intact, fundi   benign, both eyes  Ears:   Normal TM's and external ear canals, both ears  Nose:  Nares normal, septum midline, mucosa  normal, no drainage  or sinus tenderness  Throat:  Lips, mucosa, and tongue normal; teeth and gums normal  Neck:  Supple, symmetrical, trachea midline, no adenopathy;   thyroid: no enlargement/tenderness/nodules; no carotid  bruit or JVD  Back:   Symmetric, no curvature, ROM normal, no CVA tenderness  Lungs:   Clear to auscultation bilaterally, respirations unlabored  Chest Wall:   No tenderness or deformity  Heart:   Regular rate and rhythm, S1 and S2 normal, no murmur, rub  or gallop  Breast Exam:   No tenderness, masses, or nipple abnormality  Abdomen:   Soft, non-tender, bowel sounds active all four quadrants,   no masses, no organomegaly; multiple well healed Scars.   Genitalia:   Normal female without lesion, discharge or tenderness; extensive condyloma extending into anus and is bilateral vulva and perineum     Extremities:  Extremities normal, atraumatic, no cyanosis or edema  Pulses:  2+ and symmetric all extremities  Skin:  Skin color, texture, turgor normal, no rashes or lesions           Assessment:    Healthy female exam.   Extensive condyloma- pt desires resection in the OR Reviewed with pt the difference between HPV and HSV- recommended condoms at all times!   Plan:    Follow up in: 1 year. condoms at all times    Patient desires surgical management with resection of  condyloma. The risks of surgery were discussed in detail with the patient including but not limited to: bleeding which may require transfusion or reoperation; infection which may require prolonged hospitalization or re-hospitalization and chronic pain and recurrence. Patient was told that the likelihood that her condition and symptoms will be treated effectively with this surgical management was very high; the postoperative expectations were also discussed in detail. The patient also understands the alternative treatment options which were  discussed in full. All questions were answered. Pt reports that she is not working currently.  She was educated on how she would need to care for the vulvar area post op.

## 2014-05-14 ENCOUNTER — Encounter (HOSPITAL_COMMUNITY): Payer: Self-pay | Admitting: Obstetrics & Gynecology

## 2014-05-20 ENCOUNTER — Telehealth: Payer: Self-pay | Admitting: *Deleted

## 2014-05-20 NOTE — Telephone Encounter (Signed)
Pt called to speak with a nurse concerning issues with pain and inability to urinate after surgery. Pt states she is getting worse with pain. Encouraged patient to come to MAU for evaluation.  Pt verbalizes understanding.

## 2014-05-21 ENCOUNTER — Inpatient Hospital Stay (HOSPITAL_COMMUNITY)
Admission: AD | Admit: 2014-05-21 | Discharge: 2014-05-21 | Disposition: A | Discharge status: Home / Self Care | Payer: Medicaid Other | Source: Ambulatory Visit | Attending: Obstetrics & Gynecology | Admitting: Obstetrics & Gynecology

## 2014-05-21 ENCOUNTER — Encounter (HOSPITAL_COMMUNITY): Payer: Self-pay

## 2014-05-21 DIAGNOSIS — Z91040 Latex allergy status: Secondary | ICD-10-CM | POA: Diagnosis not present

## 2014-05-21 DIAGNOSIS — F17210 Nicotine dependence, cigarettes, uncomplicated: Secondary | ICD-10-CM | POA: Diagnosis not present

## 2014-05-21 DIAGNOSIS — G8918 Other acute postprocedural pain: Secondary | ICD-10-CM | POA: Diagnosis not present

## 2014-05-21 LAB — URINALYSIS, ROUTINE W REFLEX MICROSCOPIC
Bilirubin Urine: NEGATIVE
Glucose, UA: NEGATIVE mg/dL
Ketones, ur: NEGATIVE mg/dL
Nitrite: NEGATIVE
PH: 5.5 (ref 5.0–8.0)
Protein, ur: NEGATIVE mg/dL
Urobilinogen, UA: 0.2 mg/dL (ref 0.0–1.0)

## 2014-05-21 LAB — URINE MICROSCOPIC-ADD ON

## 2014-05-21 MED ORDER — LIDOCAINE HCL 2 % EX GEL: 1.0000 "application " | CUTANEOUS | Status: DC | PRN

## 2014-05-21 MED ORDER — OXYCODONE-ACETAMINOPHEN 5-325 MG PO TABS: 1.0000 | ORAL_TABLET | Freq: Four times a day (QID) | ORAL | Status: DC | PRN

## 2014-05-21 NOTE — MAU Note (Signed)
Pt reports she had surgery on 12/08 to have genital warts removed and she thinks the area is infected. States she is out of her pain meds and the cream they gave her is making it worse.

## 2014-05-21 NOTE — Discharge Instructions (Signed)
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
## 2014-06-02 ENCOUNTER — Other Ambulatory Visit: Payer: Self-pay | Admitting: *Deleted

## 2014-06-02 DIAGNOSIS — N949 Unspecified condition associated with female genital organs and menstrual cycle: Secondary | ICD-10-CM

## 2014-06-02 DIAGNOSIS — R102 Pelvic and perineal pain: Secondary | ICD-10-CM

## 2014-06-02 DIAGNOSIS — I10 Essential (primary) hypertension: Secondary | ICD-10-CM

## 2014-06-02 MED ORDER — LIDOCAINE HCL 2 % EX GEL: 1.0000 "application " | CUTANEOUS | Status: DC | PRN

## 2014-06-02 NOTE — Telephone Encounter (Signed)
Pt called the clinic requesting refill on pain medication and cream for the burn.

## 2014-06-02 NOTE — Telephone Encounter (Signed)
Called [place] back. She is requesting a refill of the lidocaine gel she was given in MAU.  Informed her I would request from doctor and send to her pharmacy. She also requests refill on pain med. Per discussion she states her pain is not as bad , maybe =5. States she doesn't really need the percocet, just something. We disucssed she could try ibuprofen and if this doesn't relieve her pain to give [place] a call back. I also reviewed her appointment for 06/04/14 with her.  Discussed with Dr. Ihor Dow and lidocaine refill approved and sent to pharmacy.

## 2014-06-02 NOTE — Telephone Encounter (Signed)
Per chart review had extensive condyloma removed in OR 05/13/14. Discharged . MAU visit 05/21/14 and given percocet 30 tabs.  Also given silvadene cream prescription after surgery 05/13/14, should have 2 refills.   Called [place] and left a message we are returning your call , please call clinic.

## 2014-06-02 NOTE — Telephone Encounter (Signed)
1417- Pt returned call and stated that she needed a refill on her cream.  That she missed the call from [place] earlier.

## 2014-06-04 ENCOUNTER — Ambulatory Visit (INDEPENDENT_AMBULATORY_CARE_PROVIDER_SITE_OTHER): Payer: Medicaid Other | Admitting: Obstetrics & Gynecology

## 2014-06-04 ENCOUNTER — Encounter: Payer: Self-pay | Admitting: Obstetrics & Gynecology

## 2014-06-04 VITALS — BP 133/90 | HR 74 | Temp 97.6°F | Ht 66.0 in | Wt 306.3 lb

## 2014-06-04 DIAGNOSIS — A630 Anogenital (venereal) warts: Secondary | ICD-10-CM

## 2014-06-04 DIAGNOSIS — Z9889 Other specified postprocedural states: Secondary | ICD-10-CM

## 2014-06-04 MED ORDER — SILVER SULFADIAZINE 1 % EX CREA: 1.0000 "application " | TOPICAL_CREAM | Freq: Two times a day (BID) | CUTANEOUS | Status: DC

## 2014-06-04 MED ORDER — BACITRACIN 500 UNIT/GM EX OINT: 1.0000 "application " | TOPICAL_OINTMENT | Freq: Three times a day (TID) | CUTANEOUS | Status: DC

## 2014-06-04 NOTE — Progress Notes (Signed)
Subjective:     Patient ID: Rebekah Howard, female   DOB: [birth year], 53 y.o.   MRN: [id number]  HPI Pt presents for post op check for resection of condyloma.  She reports that the lesions on her perineum are healing well and she is pleased with the results compared to her preop state.She reports that she was having pain and requested topical lidocaine but, does not like the way it feels. She does reports some drainage from the lesions between her buttocks and is concerned about infection from this area. No increased pain.  Reports that this area is difficult to keep clean and dry.  Review of Systems     Objective:   Physical Exam BP 133/90 mmHg  Pulse 74  Temp(Src) 97.6 F (36.4 C) (Oral)  Ht 5\' 6"  (1.676 m)  Wt 306 lb 4.8 oz (138.937 kg)  BMI 49.46 kg/m2  LMP 05/18/2014 GU: EGBUS: site of resection of condyloma healing well.  The anal area look almost completely healed.  The area between the buttocks is lagging behind the other areas.  There are no signs/sx of infections.   05/13/2014 Diagnosis Condyloma - CONDYLOMA ACUMINATUM. - NO HIGH GRADE DYSPLASIA OR MALIGNANCY IDENTIFIED     Assessment:     2 week post op check- doing well     Plan:     Bacitracin ointment tid Silvadene cream bid-tid Use Telfa as a guaze between buttocks F/u in 4 weeks or sooner prn

## 2014-06-04 NOTE — Patient Instructions (Signed)
Genital Warts Genital warts are a sexually transmitted infection. They may appear as small bumps on the tissues of the genital area. CAUSES  Genital warts are caused by a virus called human papillomavirus (HPV). HPV is the most common sexually transmitted disease (STD) and infection of the sex organs. This infection is spread by having unprotected sex with an infected person. It can be spread by vaginal, anal, and oral sex. Many people do not know they are infected. They may be infected for years without problems. However, even if they do not have problems, they can unknowingly pass the infection to their sexual partners. SYMPTOMS   Itching and irritation in the genital area.  Warts that bleed.  Painful sexual intercourse. DIAGNOSIS  Warts are usually recognized with the naked eye on the vagina, vulva, perineum, anus, and rectum. Certain tests can also diagnose genital warts, such as:  A Pap test.  A tissue sample (biopsy) exam.  Colposcopy. A magnifying tool is used to examine the vagina and cervix. The HPV cells will change color when certain solutions are used. TREATMENT  Warts can be removed by:  Applying certain chemicals, such as cantharidin or podophyllin.  Liquid nitrogen freezing (cryotherapy).  Immunotherapy with Candida or Trichophyton injections.  Laser treatment.  Burning with an electrified probe (electrocautery).  Interferon injections.  Surgery. PREVENTION  HPV vaccination can help prevent HPV infections that cause genital warts and that cause cancer of the cervix. It is recommended that the vaccination be given to people between the ages 9 to 26 years old. The vaccine might not work as well or might not work at all if you already have HPV. It should not be given to pregnant women. HOME CARE INSTRUCTIONS   It is important to follow your caregiver's instructions. The warts will not go away without treatment. Repeat treatments are often needed to get rid of warts.  Even after it appears that the warts are gone, the normal tissue underneath often remains infected.  Do not try to treat genital warts with medicine used to treat hand warts. This type of medicine is strong and can burn the skin in the genital area, causing more damage.  Tell your past and current sexual partner(s) that you have genital warts. They may be infected also and need treatment.  Avoid sexual contact while being treated.  Do not touch or scratch the warts. The infection may spread to other parts of your body.  Women with genital warts should have a cervical cancer check (Pap test) at least once a year. This type of cancer is slow-growing and can be cured if found early. Chances of developing cervical cancer are increased with HPV.  Inform your obstetrician about your warts in the event of pregnancy. This virus can be passed to the baby's respiratory tract. Discuss this with your caregiver.  Use a condom during sexual intercourse. Following treatment, the use of condoms will help prevent reinfection.  Ask your caregiver about using over-the-counter anti-itch creams. SEEK MEDICAL CARE IF:   Your treated skin becomes red, swollen, or painful.  You have a fever.  You feel generally ill.  You feel little lumps in and around your genital area.  You are bleeding or have painful sexual intercourse. MAKE SURE YOU:   Understand these instructions.  Will watch your condition.  Will get help right away if you are not doing well or get worse. Document Released: 05/20/2000 Document Revised: 10/07/2013 Document Reviewed: 11/29/2010 ExitCare Patient Information 2015 [employer]. This   information is not intended to replace advice given to you by your health care provider. Make sure you discuss any questions you have with your health care provider.   TELFA- the guaze to use

## 2014-06-09 ENCOUNTER — Telehealth: Payer: Self-pay | Admitting: *Deleted

## 2014-06-09 NOTE — Telephone Encounter (Signed)
Pt called the nurse line stating that the prescription from Dr. Ihor Dow is not on the [facility] list.  She desires a different prescription.  Attempted to contact patient, no answer, phone line busy, unable to leave a message.

## 2014-06-11 NOTE — Telephone Encounter (Signed)
Called pt to discuss her request. She stated that Medicaid does not cover the Bacitracin ointment because it is an over the counter medication.  I advised pt that she can substitute with triple antibiotic ointment or neosporin ointment. These can be purchased inexpensively at the [facility] stores if desired.  Pt voiced understanding.

## 2014-07-03 ENCOUNTER — Ambulatory Visit: Payer: Self-pay | Admitting: Obstetrics & Gynecology

## 2014-07-07 ENCOUNTER — Other Ambulatory Visit: Payer: Self-pay | Admitting: *Deleted

## 2014-07-07 DIAGNOSIS — F32A Depression, unspecified: Secondary | ICD-10-CM

## 2014-07-07 DIAGNOSIS — F329 Major depressive disorder, single episode, unspecified: Secondary | ICD-10-CM

## 2014-07-07 NOTE — Telephone Encounter (Signed)
Call from pt requesting refill on wellbutrin-will sent to pcp for review.Despina Hidden Cassady2/1/20165:05 PM

## 2014-07-08 MED ORDER — BUPROPION HCL ER (XL) 150 MG PO TB24: ORAL_TABLET | ORAL | Status: DC

## 2014-07-22 ENCOUNTER — Other Ambulatory Visit: Payer: Self-pay | Admitting: Internal Medicine

## 2014-07-28 ENCOUNTER — Other Ambulatory Visit (HOSPITAL_COMMUNITY)
Admission: RE | Admit: 2014-07-28 | Discharge: 2014-07-28 | Disposition: A | Discharge status: Home / Self Care | Payer: Medicaid Other | Source: Ambulatory Visit | Attending: Internal Medicine | Admitting: Internal Medicine

## 2014-07-28 ENCOUNTER — Encounter: Payer: Self-pay | Admitting: Internal Medicine

## 2014-07-28 ENCOUNTER — Ambulatory Visit (INDEPENDENT_AMBULATORY_CARE_PROVIDER_SITE_OTHER): Payer: Medicaid Other | Admitting: Internal Medicine

## 2014-07-28 ENCOUNTER — Encounter: Payer: Medicaid Other | Admitting: Internal Medicine

## 2014-07-28 DIAGNOSIS — N760 Acute vaginitis: Secondary | ICD-10-CM | POA: Insufficient documentation

## 2014-07-28 DIAGNOSIS — IMO0002 Reserved for concepts with insufficient information to code with codable children: Secondary | ICD-10-CM

## 2014-07-28 DIAGNOSIS — Z01419 Encounter for gynecological examination (general) (routine) without abnormal findings: Secondary | ICD-10-CM | POA: Insufficient documentation

## 2014-07-28 DIAGNOSIS — N898 Other specified noninflammatory disorders of vagina: Secondary | ICD-10-CM

## 2014-07-28 DIAGNOSIS — N941 Dyspareunia: Secondary | ICD-10-CM

## 2014-07-28 DIAGNOSIS — Z113 Encounter for screening for infections with a predominantly sexual mode of transmission: Secondary | ICD-10-CM | POA: Diagnosis present

## 2014-07-28 LAB — POCT URINE PREGNANCY: Preg Test, Ur: NEGATIVE

## 2014-07-28 NOTE — Patient Instructions (Addendum)
General Instructions:   Please try to bring all your medicines next time. This will help [place] keep you safe from mistakes.   Progress Toward Treatment Goals:  Treatment Goal 04/09/2014  Blood pressure at goal  Stop smoking (No Data)    Self Care Goals & Plans:  Self Care Goal 07/28/2014  Manage my medications take my medicines as prescribed; bring my medications to every visit  Monitor my health -  Eat healthy foods drink diet soda or water instead of juice or soda; eat more vegetables; eat foods that are low in salt; eat baked foods instead of fried foods; eat fruit for snacks and desserts  Be physically active -  Stop smoking -  Meeting treatment goals -    No flowsheet data found.   Care Management & Community Referrals:  Referral 04/09/2014  Referrals made for care management support none needed  Referrals made to community resources none       HOME CARE INSTRUCTIONS  Follow your caregiver's instructions about taking medicines, tests, counseling, and follow-up treatment.  Do not use scented tampons, douches, vaginal sprays, or soaps.  Use water-based lubricants for dryness. Oil lubricants can cause irritation.  Do not use spermicides or condoms that irritate you.  Openly discuss with your partner your sexual experience, your desires, foreplay, and different sexual positions for a more comfortable and enjoyable sexual relationship.  Join group sessions for therapy, if needed.  Practice safe sex at all times.  Empty your bladder before having intercourse.  Try different positions during sexual intercourse.  Take over-the-counter pain medicine recommended by your caregiver before having sexual intercourse.  Do not wear pantyhose. Knee-high and thigh-high hose are okay.  Avoid scrubbing your vulva with a washcloth. Wash the area gently and pat dry with a towel. SEEK MEDICAL CARE IF:  You develop vaginal bleeding after sexual intercourse.  You develop a lump at the  opening of your vagina, even if it is not painful.  You have abnormal vaginal discharge.  You have vaginal dryness.  You have itching or irritation of the vulva or vagina.  You develop a rash or reaction to your medicine. SEEK IMMEDIATE MEDICAL CARE IF:  You develop severe abdominal pain during or shortly after sexual intercourse. You could have a ruptured ovarian cyst or ruptured tubal pregnancy.  You have a fever.  You have painful or bloody urination.  You have painful sexual intercourse, and you never had it before.  You pass out after having sexual intercourse.
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
## 2014-07-29 DIAGNOSIS — IMO0002 Reserved for concepts with insufficient information to code with codable children: Secondary | ICD-10-CM | POA: Insufficient documentation

## 2014-07-29 DIAGNOSIS — N898 Other specified noninflammatory disorders of vagina: Secondary | ICD-10-CM | POA: Insufficient documentation

## 2014-07-29 LAB — CYTOLOGY - PAP

## 2014-07-29 LAB — CERVICOVAGINAL ANCILLARY ONLY
Chlamydia: NEGATIVE
Neisseria Gonorrhea: NEGATIVE

## 2014-07-29 LAB — RPR

## 2014-07-29 LAB — HIV ANTIBODY (ROUTINE TESTING W REFLEX): HIV 1&2 Ab, 4th Generation: NONREACTIVE
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
## 2014-07-29 NOTE — Assessment & Plan Note (Addendum)
Given pt STI hx will workup abnormal discharge. Pregnancy test was negative.  -wet prep, HIV, RPR, GC/chlamydia -pap smear completed   Addendum: pt had + gardenella sent flagyl 500mg  BID x7 days to pharmacy

## 2014-07-30 LAB — CERVICOVAGINAL ANCILLARY ONLY
Wet Prep (BD Affirm): NEGATIVE
Wet Prep (BD Affirm): NEGATIVE
Wet Prep (BD Affirm): POSITIVE — AB

## 2014-07-30 MED ORDER — METRONIDAZOLE 500 MG PO TABS: 500.0000 mg | ORAL_TABLET | Freq: Two times a day (BID) | ORAL | Status: AC

## 2014-07-30 NOTE — Addendum Note (Signed)
Addended by: Jerrye Noble on: 07/30/2014 08:45 AM   Modules accepted: Orders

## 2014-07-30 NOTE — Progress Notes (Signed)
INTERNAL MEDICINE TEACHING ATTENDING ADDENDUM - Camilla Skeen, MD: I reviewed and discussed at the time of visit with the resident Dr. Sadek, the patient's medical history, physical examination, diagnosis and results of pertinent tests and treatment and I agree with the patient's care as documented.  

## 2014-07-31 NOTE — Progress Notes (Signed)
This encounter was created in error - please disregard.

## 2014-08-19 ENCOUNTER — Encounter: Payer: Self-pay | Admitting: Internal Medicine

## 2014-08-19 ENCOUNTER — Ambulatory Visit (INDEPENDENT_AMBULATORY_CARE_PROVIDER_SITE_OTHER): Payer: Medicaid Other | Admitting: Internal Medicine

## 2014-08-19 VITALS — BP 110/80 | HR 80 | Temp 97.9°F | Ht 66.0 in | Wt 315.1 lb

## 2014-08-19 DIAGNOSIS — F17200 Nicotine dependence, unspecified, uncomplicated: Secondary | ICD-10-CM

## 2014-08-19 DIAGNOSIS — Z720 Tobacco use: Secondary | ICD-10-CM

## 2014-08-19 DIAGNOSIS — I10 Essential (primary) hypertension: Secondary | ICD-10-CM

## 2014-08-19 DIAGNOSIS — F418 Other specified anxiety disorders: Secondary | ICD-10-CM

## 2014-08-19 DIAGNOSIS — N898 Other specified noninflammatory disorders of vagina: Secondary | ICD-10-CM

## 2014-08-19 NOTE — Patient Instructions (Signed)
It was a pleasure taking care of you, Rebekah Howard.  1. Vaginal discharge - Monitor for now since you just completed antibiotics - Will see you in 3 weeks and will reassess at that time  2. Depression - Please make an appointment at [facility] (Dr. Silvio Pate) in the next 1 week - If you have any thoughts of suicide call the clinic or go to the emergency room  - Focus on positive things in your life and continue walking  General Instructions:   Thank you for bringing your medicines today. This helps [place] keep you safe from mistakes.   Progress Toward Treatment Goals:  Treatment Goal 04/09/2014  Blood pressure at goal  Stop smoking (No Data)    Self Care Goals & Plans:  Self Care Goal 07/28/2014  Manage my medications take my medicines as prescribed; bring my medications to every visit  Monitor my health -  Eat healthy foods drink diet soda or water instead of juice or soda; eat more vegetables; eat foods that are low in salt; eat baked foods instead of fried foods; eat fruit for snacks and desserts  Be physically active -  Stop smoking -  Meeting treatment goals -    No flowsheet data found.   Care Management & Community Referrals:  Referral 04/09/2014  Referrals made for care management support none needed  Referrals made to community resources none

## 2014-08-21 NOTE — Assessment & Plan Note (Signed)
Well controlled. - Continue Lisinopril-HCTZ 20-25 mg daily

## 2014-08-21 NOTE — Progress Notes (Signed)
Case discussed with Dr. Rivet at time of visit.  We reviewed the resident's history and exam and pertinent patient test results.  I agree with the assessment, diagnosis, and plan of care documented in the resident's note. 
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
## 2014-08-21 NOTE — Assessment & Plan Note (Signed)
Patient reporting white vaginal discharge for 1 day after completing 14 day course of Flagyl for Gardnerella infection.  - Additional antibiotics not needed at this time given she just completed course - Re-evaluate at follow up visit to see if still having symptoms

## 2014-08-21 NOTE — Assessment & Plan Note (Signed)
Patient with concerning symptoms of depression/anxiety. Looking back through records, appears that she was on Bupropion 300 mg daily and Zoloft regimen before but was not compliant with these medications. She is currently only taking Bupropion 150 mg daily and admitted that Dr. Silvio Pate with [facility] had increased her dose to 300 mg daily.  - Patient advised to make appointment with Dr. Silvio Pate in next 1 week - Encouraged to go to ED or call clinic if she is having suicidal ideation - Patient instructed to discuss Bupropion dose with Dr. Silvio Pate as it seems she should be taking 300 mg daily instead of 150 mg daily  - Likely patient needs to be on 2 antidepressants, can consider adding Zoloft back if symptoms not controlled at next visit

## 2014-08-21 NOTE — Assessment & Plan Note (Signed)
Patient still smoking 2-3 cigarettes daily. Does not wish to try nicotine gum or patch. Her anxiety prevents her from quitting. She has tried Chantix before without success. She was educated on the risks of smoking and associated health problems.  - She agreed to decrease to 1-2 cigarettes daily - Will continue to encourage smoking cessation - Set quit date once depression/anxiety under better control

## 2014-09-01 ENCOUNTER — Encounter (HOSPITAL_COMMUNITY): Payer: Self-pay | Admitting: *Deleted

## 2014-09-01 ENCOUNTER — Inpatient Hospital Stay (HOSPITAL_COMMUNITY)
Admission: AD | Admit: 2014-09-01 | Discharge: 2014-09-01 | Disposition: A | Discharge status: Home / Self Care | Payer: Medicaid Other | Source: Ambulatory Visit | Attending: Obstetrics & Gynecology | Admitting: Obstetrics & Gynecology

## 2014-09-01 ENCOUNTER — Ambulatory Visit: Payer: Self-pay

## 2014-09-01 DIAGNOSIS — N951 Menopausal and female climacteric states: Secondary | ICD-10-CM

## 2014-09-01 DIAGNOSIS — F17210 Nicotine dependence, cigarettes, uncomplicated: Secondary | ICD-10-CM | POA: Insufficient documentation

## 2014-09-01 DIAGNOSIS — N924 Excessive bleeding in the premenopausal period: Secondary | ICD-10-CM | POA: Insufficient documentation

## 2014-09-01 DIAGNOSIS — I10 Essential (primary) hypertension: Secondary | ICD-10-CM | POA: Insufficient documentation

## 2014-09-01 DIAGNOSIS — N939 Abnormal uterine and vaginal bleeding, unspecified: Secondary | ICD-10-CM | POA: Diagnosis present

## 2014-09-01 DIAGNOSIS — Z79899 Other long term (current) drug therapy: Secondary | ICD-10-CM | POA: Insufficient documentation

## 2014-09-01 DIAGNOSIS — Z91040 Latex allergy status: Secondary | ICD-10-CM | POA: Insufficient documentation

## 2014-09-01 DIAGNOSIS — E785 Hyperlipidemia, unspecified: Secondary | ICD-10-CM | POA: Diagnosis not present

## 2014-09-01 DIAGNOSIS — M1990 Unspecified osteoarthritis, unspecified site: Secondary | ICD-10-CM | POA: Diagnosis not present

## 2014-09-01 DIAGNOSIS — N926 Irregular menstruation, unspecified: Secondary | ICD-10-CM

## 2014-09-01 DIAGNOSIS — F329 Major depressive disorder, single episode, unspecified: Secondary | ICD-10-CM | POA: Diagnosis not present

## 2014-09-01 HISTORY — DX: Other specified postprocedural states: Z98.890

## 2014-09-01 HISTORY — DX: Other specified postprocedural states: R11.2

## 2014-09-01 LAB — URINALYSIS, ROUTINE W REFLEX MICROSCOPIC
BILIRUBIN URINE: NEGATIVE
Glucose, UA: NEGATIVE mg/dL
KETONES UR: NEGATIVE mg/dL
Leukocytes, UA: NEGATIVE
NITRITE: NEGATIVE
PROTEIN: NEGATIVE mg/dL
Specific Gravity, Urine: 1.025 (ref 1.005–1.030)
Urobilinogen, UA: 0.2 mg/dL (ref 0.0–1.0)
pH: 5.5 (ref 5.0–8.0)

## 2014-09-01 LAB — CBC WITH DIFFERENTIAL/PLATELET
Basophils Absolute: 0 10*3/uL (ref 0.0–0.1)
Basophils Relative: 1 % (ref 0–1)
EOS ABS: 0.1 10*3/uL (ref 0.0–0.7)
Eosinophils Relative: 3 % (ref 0–5)
HCT: 40.6 % (ref 36.0–46.0)
Hemoglobin: 13.9 g/dL (ref 12.0–15.0)
Lymphocytes Relative: 28 % (ref 12–46)
Lymphs Abs: 1.4 10*3/uL (ref 0.7–4.0)
MCH: 29.9 pg (ref 26.0–34.0)
MCHC: 34.2 g/dL (ref 30.0–36.0)
MCV: 87.3 fL (ref 78.0–100.0)
Monocytes Absolute: 0.2 10*3/uL (ref 0.1–1.0)
Monocytes Relative: 5 % (ref 3–12)
NEUTROS PCT: 63 % (ref 43–77)
Neutro Abs: 3.2 10*3/uL (ref 1.7–7.7)
PLATELETS: 260 10*3/uL (ref 150–400)
RBC: 4.65 MIL/uL (ref 3.87–5.11)
RDW: 14.6 % (ref 11.5–15.5)
WBC: 5 10*3/uL (ref 4.0–10.5)

## 2014-09-01 LAB — WET PREP, GENITAL
CLUE CELLS WET PREP: NONE SEEN
Trich, Wet Prep: NONE SEEN
WBC WET PREP: NONE SEEN
Yeast Wet Prep HPF POC: NONE SEEN

## 2014-09-01 LAB — URINE MICROSCOPIC-ADD ON

## 2014-09-01 LAB — POCT PREGNANCY, URINE: Preg Test, Ur: NEGATIVE

## 2014-09-01 NOTE — Discharge Instructions (Signed)
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
## 2014-09-01 NOTE — MAU Note (Addendum)
PT SAYS  SHE HAD AN APPOINTMENT  TODAY  AT GYN CLINIC  AT 10AM  -    TO CHECK-UP  ON UTI    SHE SAW DR PINN LAST WEEK - TOLD HER   SHE HAD UTI- GAVE  MEDS-  CIPRO.    THEN  THE WEEK BEFORE  SHE WENT  TO [place]-  FOR  BV-    FINISHED  FLAGYL  .     GOES TO [facility] FOR PS[facility]CHOLOGIST.       WHILE SHE WAS DOWNSTAIRS - SHE TOLD THEM  SHE HAS   VAG BLEEDING - SO THEY SENT HER UP HERE.     LMP WAS 04-13-2014.    THEN SAT - SHE STARTED  BLEEDING - CONTINUED SUN AND TODAY   - SAYS  PASSING  CLOTS  - SIZE  OF NICKEL.    IN ROOM-  NO BLEEDING  ON PERINEUM.     FEELS  CRAMPS-  STARTED ON SAT. BTL-  IN  1989.   LAST SEX-   LAST WEEK.    PT  SAYS SHE LIKES  TO  GO TO DIFFERENT  DR  TO GET DIFFERENT  OPINION.
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
## 2014-09-01 NOTE — MAU Note (Signed)
Started bleeding on Sat, hadn't had a period since November.  Bleeding is heavier than usual.

## 2014-09-02 LAB — HIV ANTIBODY (ROUTINE TESTING W REFLEX): HIV Screen 4th Generation wRfx: NONREACTIVE

## 2014-09-02 LAB — RPR: RPR Ser Ql: NONREACTIVE

## 2014-09-11 LAB — GC/CHLAMYDIA PROBE AMP (~~LOC~~) NOT AT ARMC
CHLAMYDIA, DNA PROBE: NEGATIVE
Neisseria Gonorrhea: NEGATIVE

## 2014-10-14 ENCOUNTER — Encounter: Payer: Self-pay | Admitting: *Deleted

## 2014-10-29 ENCOUNTER — Other Ambulatory Visit: Payer: Self-pay | Admitting: Internal Medicine

## 2014-10-29 NOTE — Telephone Encounter (Signed)
Pt.notified

## 2014-10-29 NOTE — Telephone Encounter (Signed)
PT STATES SHE IS OUT OF MEDICINE, i have sent this to dr rivet and the attending so this can be filled today

## 2014-11-24 ENCOUNTER — Other Ambulatory Visit: Payer: Self-pay | Admitting: *Deleted

## 2014-11-24 ENCOUNTER — Telehealth: Payer: Self-pay | Admitting: Internal Medicine

## 2014-11-24 NOTE — Telephone Encounter (Signed)
Script was done in may, called pharm they have the script and will get it ready and notify pt, pt notified and will expect a call from [facility]

## 2014-11-24 NOTE — Telephone Encounter (Signed)
Patient is requesting refill of Blood Pressure Medication. Sent to [facility] pharmacy on [address].

## 2014-11-25 NOTE — Telephone Encounter (Signed)
Called pt, spoke to her again about discussion yesterday, informed her again that she would have to physically appear at pharmacy to know what her medicaid plan will pay for, 30 or 90 day supplies of meds, she states she will need to walk to pharm and was hoping i could call and have the pharm run it through to find out, explained again that she would need to be physically present because she would have to complete the transaction at that time and pay her cost. She will call pharmacy, resolved

## 2014-12-16 ENCOUNTER — Ambulatory Visit (INDEPENDENT_AMBULATORY_CARE_PROVIDER_SITE_OTHER): Payer: Medicaid Other | Admitting: Internal Medicine

## 2014-12-16 ENCOUNTER — Encounter: Payer: Self-pay | Admitting: Internal Medicine

## 2014-12-16 VITALS — BP 125/82 | HR 65 | Temp 98.0°F | Ht 66.0 in | Wt 297.4 lb

## 2014-12-16 DIAGNOSIS — N898 Other specified noninflammatory disorders of vagina: Secondary | ICD-10-CM

## 2014-12-16 DIAGNOSIS — B351 Tinea unguium: Secondary | ICD-10-CM | POA: Diagnosis not present

## 2014-12-16 DIAGNOSIS — K219 Gastro-esophageal reflux disease without esophagitis: Secondary | ICD-10-CM

## 2014-12-16 DIAGNOSIS — E785 Hyperlipidemia, unspecified: Secondary | ICD-10-CM | POA: Diagnosis not present

## 2014-12-16 DIAGNOSIS — I10 Essential (primary) hypertension: Secondary | ICD-10-CM | POA: Diagnosis not present

## 2014-12-16 DIAGNOSIS — F17210 Nicotine dependence, cigarettes, uncomplicated: Secondary | ICD-10-CM

## 2014-12-16 DIAGNOSIS — R1032 Left lower quadrant pain: Secondary | ICD-10-CM | POA: Diagnosis not present

## 2014-12-16 DIAGNOSIS — N941 Dyspareunia: Secondary | ICD-10-CM | POA: Diagnosis not present

## 2014-12-16 DIAGNOSIS — F418 Other specified anxiety disorders: Secondary | ICD-10-CM | POA: Diagnosis not present

## 2014-12-16 DIAGNOSIS — IMO0002 Reserved for concepts with insufficient information to code with codable children: Secondary | ICD-10-CM

## 2014-12-16 DIAGNOSIS — F17200 Nicotine dependence, unspecified, uncomplicated: Secondary | ICD-10-CM

## 2014-12-16 DIAGNOSIS — Z6841 Body Mass Index (BMI) 40.0 and over, adult: Secondary | ICD-10-CM

## 2014-12-16 LAB — CBC WITH DIFFERENTIAL/PLATELET
BASOS PCT: 0 % (ref 0–1)
Basophils Absolute: 0 10*3/uL (ref 0.0–0.1)
Eosinophils Absolute: 0.2 10*3/uL (ref 0.0–0.7)
Eosinophils Relative: 3 % (ref 0–5)
HCT: 43.4 % (ref 36.0–46.0)
Hemoglobin: 14.8 g/dL (ref 12.0–15.0)
LYMPHS ABS: 2 10*3/uL (ref 0.7–4.0)
Lymphocytes Relative: 30 % (ref 12–46)
MCH: 29.7 pg (ref 26.0–34.0)
MCHC: 34.1 g/dL (ref 30.0–36.0)
MCV: 87.1 fL (ref 78.0–100.0)
MONO ABS: 0.5 10*3/uL (ref 0.1–1.0)
MONOS PCT: 7 % (ref 3–12)
MPV: 11 fL (ref 8.6–12.4)
NEUTROS ABS: 4 10*3/uL (ref 1.7–7.7)
Neutrophils Relative %: 60 % (ref 43–77)
Platelets: 287 10*3/uL (ref 150–400)
RBC: 4.98 MIL/uL (ref 3.87–5.11)
RDW: 14.9 % (ref 11.5–15.5)
WBC: 6.7 10*3/uL (ref 4.0–10.5)

## 2014-12-16 LAB — LIPID PANEL
Cholesterol: 236 mg/dL — ABNORMAL HIGH (ref 0–200)
HDL: 45 mg/dL — ABNORMAL LOW (ref >=46)
LDL Cholesterol: 169 mg/dL — ABNORMAL HIGH (ref 0–99)
Total CHOL/HDL Ratio: 5.2 Ratio
Triglycerides: 112 mg/dL (ref <150)
VLDL: 22 mg/dL (ref 0–40)

## 2014-12-16 LAB — BASIC METABOLIC PANEL WITH GFR
BUN: 18 mg/dL (ref 6–23)
CHLORIDE: 104 meq/L (ref 96–112)
CO2: 27 meq/L (ref 19–32)
CREATININE: 0.79 mg/dL (ref 0.50–1.10)
Calcium: 9.4 mg/dL (ref 8.4–10.5)
GFR, Est Non African American: 86 mL/min
Glucose, Bld: 75 mg/dL (ref 70–99)
POTASSIUM: 4.1 meq/L (ref 3.5–5.3)
Sodium: 139 mEq/L (ref 135–145)

## 2014-12-16 LAB — GLUCOSE, CAPILLARY: Glucose-Capillary: 83 mg/dL (ref 65–99)

## 2014-12-16 LAB — POCT GLYCOSYLATED HEMOGLOBIN (HGB A1C): HEMOGLOBIN A1C: 5.5

## 2014-12-16 MED ORDER — EFINACONAZOLE 10 % EX SOLN: 1.0000 "application " | Freq: Two times a day (BID) | CUTANEOUS | Status: DC

## 2014-12-16 NOTE — Assessment & Plan Note (Signed)
Patient had noted vaginal discharge during her last visit after she had just completed a 14 day course of Flagyl for Gardnerella infection. No additional antibiotics were prescribed. She notes her vaginal discharge has resolved.

## 2014-12-16 NOTE — Assessment & Plan Note (Signed)
Patient reports her symptoms are well controlled. She takes pepto bismol only occasionally.

## 2014-12-16 NOTE — Assessment & Plan Note (Addendum)
Patient has lost 18 lbs since our last visit in March (315>>297 lbs). She reports walking daily for 1 hour. She states her depressive symptoms have improved since she started walking. She has also made an effort to eat healthier by eating more vegetables and avoiding fried foods.  - Encouraged her to continue walking daily

## 2014-12-16 NOTE — Progress Notes (Signed)
   Subjective:    Patient ID: Rebekah Howard, female    DOB: 10-19-1960, 54 y.o.   MRN: [id number]  HPI Ms. Stagner is a 54yo woman with PMHx of HTN, depression, anxiety, and tobacco abuse who presents today for a follow up visit.   Please refer to A&P documentation.    Review of Systems General: Denies fever, chills, night sweats, changes in appetite. Reports weight loss-intentional.  HEENT: Denies headaches, ear pain, changes in vision, rhinorrhea, sore throat CV: Denies CP, palpitations, SOB, orthopnea Pulm: Denies SOB, cough, wheezing GI: Reports abdominal pain, nausea. Denies vomiting, diarrhea, constipation, melena, hematochezia GU: Denies dysuria, hematuria. Reports frequency Msk: Denies muscle cramps, joint pains Neuro: Denies weakness, numbness, tingling Skin: Denies rashes, bruising    Objective:   Physical Exam General: sitting up in chair, obese, NAD HEENT: [place]/AT, EOMI, sclera anicteric, mucus membranes moist CV: RRR, no m/g/r Pulm: CTA bilaterally, breaths non-labored Abd: BS+, soft, obese, tenderness in the epigastrium and LLQ. No costovertebral tenderness.  Ext: warm, no edema. Onychomycosis noted in toenails.  Neuro: alert and oriented x 3, no focal deficits    Assessment & Plan:  Refer to A&P documentation.

## 2014-12-16 NOTE — Assessment & Plan Note (Signed)
Patient notes she is still having dyspareunia. She reports vaginal dryness and thinks that is likely contributing. She has not tried using lubrication. She denies any vaginal bleeding or discharge.  - Recommended to try KY jelly during intercourse  - Consider referral to OBGYN if does not improve with conservative treatment

## 2014-12-16 NOTE — Assessment & Plan Note (Addendum)
Patient reporting a 1 month hx of left-sided abdominal pain. She reports the pain starts in her left back and then radiates around to the front lower left abdomen. She describes the pain as moderate, sharp, occurring daily, and improves after taking BC powder. Patient thinks she may be pregnant since she has noticed breast tenderness and swelling. However, she notes she had her tubes tied 26 years ago. She notes having a new sexual partner since I last saw her in March. She reports not using contraceptives. She is concerned about a possible STI and HIV infection. She does note some urinary frequency and nausea but denies any fevers, chills, vomiting, diarrhea, constipation, dark or bloody stools, dysuria, hematuria, pelvic pain, or vaginal discharge.   Assessment: Patient with a 1 month hx of left-sided flank/abdominal pain in setting of new sexual partner. Differential includes UTI (less likely as has been ongoing for over a month, but does have frequency and flank pain) vs PID (new sexual partner) vs diverticulitis (however no diarrhea) vs pregnancy (doubtful as tubes tied and given age) vs other gynecological issue vs undiagnosed diabetes (morbidly obese, abdominal pain)  Plan: - Check basic labs- CBC, bmet - UA, urine pregnancy - HIV - HbA1c - Recommended to come back to clinic if symptoms worsen or do not improve in next few weeks - Consider pelvic exam if symptoms do not improve  - Recommended to stop BC powder and use ibuprofen or Tylenol as needed for pain

## 2014-12-16 NOTE — Assessment & Plan Note (Signed)
BP Readings from Last 3 Encounters:  12/16/14 125/82  09/01/14 112/69  08/19/14 110/80    Lab Results  Component Value Date   NA 137 05/12/2014   K 3.8 05/12/2014   CREATININE 0.80 05/12/2014    Assessment: Blood pressure control: controlled Progress toward BP goal:  at goal Comments: BP well controlled.   Plan: Medications:  Continue Lisinopril-HCTZ 20-25 mg daily

## 2014-12-16 NOTE — Assessment & Plan Note (Signed)
Fungal infection noted on multiple toenails. Will prescribe Efineconazole to be applied twice daily to the affected nails.

## 2014-12-16 NOTE — Assessment & Plan Note (Addendum)
Patient reports she last saw Dr. Silvio Pate two weeks ago. Her Bupropion was increased to 300 mg daily. She reports Dr. Silvio Pate also started her on Klonopin 1 mg QHS to help with sleep. We discussed that Klonopin is a short term medication and that she should only take it when absolutely necessary for sleep/anxiety. Patient also reports walking daily which is helping her depression symptoms.  - Continue Bupropion 300 mg daily - Continue Klonopin 1 mg QHS - Encouraged to continue walking

## 2014-12-16 NOTE — Assessment & Plan Note (Signed)
Patient is not on a statin currently as she reported GI side effects in the past. We discussed the benefits of starting a statin given her elevated ASCVD risk score of 6.3% and LDL 147. Will check a lipid panel today and patient agrees if LDL still elevated that she will consider trying statin therapy again.

## 2014-12-16 NOTE — Assessment & Plan Note (Signed)
  Assessment: Progress toward smoking cessation:  smoking the same amount Barriers to progress toward smoking cessation:  lack of motivation to quit, stress, withdrawal symptoms Comments: Patient still smoking 2-3 cigarettes daily. She states her anxiety is preventing her from quitting. She has no desires to quit at this time.   Plan: Instruction/counseling given:  I counseled patient on the dangers of tobacco use, advised patient to stop smoking, and reviewed strategies to maximize success. Medications to assist with smoking cessation:  None Patient agreed to the following self-care plans for smoking cessation: cut down the number of cigarettes smoked  Other plans: She agreed to try to cut down on the number of cigarettes she is smoking and to think about quitting in the near future.  - Continue to encourage smoking cessation

## 2014-12-16 NOTE — Patient Instructions (Signed)
-   Lab work today to assess your abdominal pain - If your symptoms worsen or do not get better in the next few weeks then please come back to the clinic - Consider making an appointment with your OBGYN - Try KY jelly during sexual intercourse to help your pain - Apply Efinaconzaole twice daily to the affected toe for your fungal infection  General Instructions:   Please bring your medicines with you each time you come to clinic.  Medicines may include prescription medications, over-the-counter medications, herbal remedies, eye drops, vitamins, or other pills.   Progress Toward Treatment Goals:  Treatment Goal 04/09/2014  Blood pressure at goal  Stop smoking (No Data)    Self Care Goals & Plans:  Self Care Goal 07/28/2014  Manage my medications take my medicines as prescribed; bring my medications to every visit  Monitor my health -  Eat healthy foods drink diet soda or water instead of juice or soda; eat more vegetables; eat foods that are low in salt; eat baked foods instead of fried foods; eat fruit for snacks and desserts  Be physically active -  Stop smoking -  Meeting treatment goals -    No flowsheet data found.   Care Management & Community Referrals:  Referral 04/09/2014  Referrals made for care management support none needed  Referrals made to community resources none

## 2014-12-17 ENCOUNTER — Telehealth: Payer: Self-pay | Admitting: Internal Medicine

## 2014-12-17 DIAGNOSIS — R1032 Left lower quadrant pain: Secondary | ICD-10-CM

## 2014-12-17 DIAGNOSIS — E785 Hyperlipidemia, unspecified: Secondary | ICD-10-CM

## 2014-12-17 LAB — URINALYSIS, MICROSCOPIC ONLY
CASTS: NONE SEEN
Crystals: NONE SEEN
SQUAMOUS EPITHELIAL / LPF: NONE SEEN

## 2014-12-17 LAB — HIV ANTIBODY (ROUTINE TESTING W REFLEX): HIV 1&2 Ab, 4th Generation: NONREACTIVE

## 2014-12-17 LAB — URINALYSIS, ROUTINE W REFLEX MICROSCOPIC
BILIRUBIN URINE: NEGATIVE
Glucose, UA: NEGATIVE mg/dL
Ketones, ur: NEGATIVE mg/dL
NITRITE: POSITIVE — AB
Protein, ur: NEGATIVE mg/dL
SPECIFIC GRAVITY, URINE: 1.016 (ref 1.005–1.030)
Urobilinogen, UA: 0.2 mg/dL (ref 0.0–1.0)
pH: 5 (ref 5.0–8.0)

## 2014-12-17 LAB — PREGNANCY, URINE: PREG TEST UR: NEGATIVE

## 2014-12-17 MED ORDER — NITROFURANTOIN MACROCRYSTAL 100 MG PO CAPS: 100.0000 mg | ORAL_CAPSULE | Freq: Two times a day (BID) | ORAL | Status: DC

## 2014-12-17 MED ORDER — LOVASTATIN 40 MG PO TABS: 40.0000 mg | ORAL_TABLET | Freq: Every day | ORAL | Status: DC

## 2014-12-17 MED ORDER — PHENAZOPYRIDINE HCL 200 MG PO TABS: 200.0000 mg | ORAL_TABLET | Freq: Three times a day (TID) | ORAL | Status: DC | PRN

## 2014-12-17 NOTE — Progress Notes (Signed)
Internal Medicine Clinic Attending  Case discussed with Dr. Rivet at the time of the visit.  We reviewed the resident's history and exam and pertinent patient test results.  I agree with the assessment, diagnosis, and plan of care documented in the resident's note.  

## 2014-12-17 NOTE — Telephone Encounter (Signed)
Discussed UA results with patient. Prescribed Nitrofurantoin 100 mg BID for 7 days. Patient also requested Pyridium which was prescribed. Patient told results of lipid panel and has agreed to start statin. Will start lovastatin 40 mg daily since had GI intolerance with Lipitor previously.

## 2014-12-22 ENCOUNTER — Telehealth: Payer: Self-pay | Admitting: *Deleted

## 2014-12-22 DIAGNOSIS — B351 Tinea unguium: Secondary | ICD-10-CM

## 2014-12-22 NOTE — Telephone Encounter (Signed)
Call to [employer] for Prior Authorization for Jublia 10% Topical Solution.  For Review to Pharmacy.  Patient will need to try 2 of the following first: Tramazol Creme, Betamethasone Creme, Nystatin Creme or Ketoconazole.  Reference # is [id number].  Will receive notification within 24 hours.  Sander Nephew, RN 12/12/2014.

## 2014-12-22 NOTE — Telephone Encounter (Signed)
Call was given to me regarding med for toenail from complaint from patient experience by D. Elisabeth Cara. Talked with [facility] about efinaconazole 10% soln. Pharmacy had Rx - needed PA. Paper was faxed to clinic  and given  to G. Herbin. talked to pt next and explain pharmacy had Rx - needed PA. Pt aware she will receive a call from [place] about if insurance company will approve med. Seward Grater aware. Hilda Blades Laguana Desautel RN 12/22/14 10:45AM

## 2014-12-25 MED ORDER — KETOCONAZOLE 2 % EX CREA: 1.0000 "application " | TOPICAL_CREAM | Freq: Every day | CUTANEOUS | Status: DC

## 2014-12-25 NOTE — Telephone Encounter (Signed)
Will change prescription to Ketoconazole topical. Thanks!

## 2015-01-04 ENCOUNTER — Emergency Department (HOSPITAL_COMMUNITY): Payer: Medicaid Other

## 2015-01-04 ENCOUNTER — Emergency Department (HOSPITAL_COMMUNITY)
Admission: EM | Admit: 2015-01-04 | Discharge: 2015-01-04 | Disposition: A | Discharge status: Home / Self Care | Payer: Medicaid Other | Attending: Emergency Medicine | Admitting: Emergency Medicine

## 2015-01-04 ENCOUNTER — Encounter (HOSPITAL_COMMUNITY): Payer: Self-pay

## 2015-01-04 DIAGNOSIS — E785 Hyperlipidemia, unspecified: Secondary | ICD-10-CM | POA: Insufficient documentation

## 2015-01-04 DIAGNOSIS — R0602 Shortness of breath: Secondary | ICD-10-CM | POA: Insufficient documentation

## 2015-01-04 DIAGNOSIS — Z8619 Personal history of other infectious and parasitic diseases: Secondary | ICD-10-CM | POA: Insufficient documentation

## 2015-01-04 DIAGNOSIS — Z3202 Encounter for pregnancy test, result negative: Secondary | ICD-10-CM | POA: Insufficient documentation

## 2015-01-04 DIAGNOSIS — N938 Other specified abnormal uterine and vaginal bleeding: Secondary | ICD-10-CM | POA: Diagnosis not present

## 2015-01-04 DIAGNOSIS — Z720 Tobacco use: Secondary | ICD-10-CM | POA: Diagnosis not present

## 2015-01-04 DIAGNOSIS — Z91040 Latex allergy status: Secondary | ICD-10-CM | POA: Insufficient documentation

## 2015-01-04 DIAGNOSIS — F329 Major depressive disorder, single episode, unspecified: Secondary | ICD-10-CM | POA: Insufficient documentation

## 2015-01-04 DIAGNOSIS — Z872 Personal history of diseases of the skin and subcutaneous tissue: Secondary | ICD-10-CM | POA: Insufficient documentation

## 2015-01-04 DIAGNOSIS — R1084 Generalized abdominal pain: Secondary | ICD-10-CM | POA: Diagnosis present

## 2015-01-04 DIAGNOSIS — Z8719 Personal history of other diseases of the digestive system: Secondary | ICD-10-CM | POA: Diagnosis not present

## 2015-01-04 DIAGNOSIS — Z79899 Other long term (current) drug therapy: Secondary | ICD-10-CM | POA: Diagnosis not present

## 2015-01-04 DIAGNOSIS — N898 Other specified noninflammatory disorders of vagina: Secondary | ICD-10-CM | POA: Diagnosis not present

## 2015-01-04 DIAGNOSIS — I10 Essential (primary) hypertension: Secondary | ICD-10-CM | POA: Diagnosis not present

## 2015-01-04 DIAGNOSIS — K5900 Constipation, unspecified: Secondary | ICD-10-CM | POA: Insufficient documentation

## 2015-01-04 DIAGNOSIS — R1083 Colic: Secondary | ICD-10-CM

## 2015-01-04 LAB — WET PREP, GENITAL
Trich, Wet Prep: NONE SEEN
Yeast Wet Prep HPF POC: NONE SEEN

## 2015-01-04 LAB — COMPREHENSIVE METABOLIC PANEL
ALT: 17 U/L (ref 14–54)
AST: 15 U/L (ref 15–41)
Albumin: 2.6 g/dL — ABNORMAL LOW (ref 3.5–5.0)
Alkaline Phosphatase: 69 U/L (ref 38–126)
Anion gap: 9 (ref 5–15)
BUN: 9 mg/dL (ref 6–20)
CALCIUM: 8.2 mg/dL — AB (ref 8.9–10.3)
CO2: 24 mmol/L (ref 22–32)
CREATININE: 0.88 mg/dL (ref 0.44–1.00)
Chloride: 104 mmol/L (ref 101–111)
GFR calc Af Amer: >60 mL/min (ref >60)
GFR calc non Af Amer: >60 mL/min (ref >60)
Glucose, Bld: 96 mg/dL (ref 65–99)
Potassium: 3.3 mmol/L — ABNORMAL LOW (ref 3.5–5.1)
Sodium: 137 mmol/L (ref 135–145)
TOTAL PROTEIN: 6 g/dL — AB (ref 6.5–8.1)
Total Bilirubin: 0.7 mg/dL (ref 0.3–1.2)

## 2015-01-04 LAB — URINALYSIS, ROUTINE W REFLEX MICROSCOPIC
Bilirubin Urine: NEGATIVE
GLUCOSE, UA: NEGATIVE mg/dL
Ketones, ur: NEGATIVE mg/dL
Leukocytes, UA: NEGATIVE
Nitrite: NEGATIVE
PH: 6 (ref 5.0–8.0)
Protein, ur: NEGATIVE mg/dL
Specific Gravity, Urine: 1.023 (ref 1.005–1.030)
UROBILINOGEN UA: 1 mg/dL (ref 0.0–1.0)

## 2015-01-04 LAB — URINE MICROSCOPIC-ADD ON

## 2015-01-04 LAB — POC URINE PREG, ED: PREG TEST UR: NEGATIVE

## 2015-01-04 LAB — CBC WITH DIFFERENTIAL/PLATELET
BASOS PCT: 0 % (ref 0–1)
Basophils Absolute: 0 10*3/uL (ref 0.0–0.1)
EOS ABS: 0.1 10*3/uL (ref 0.0–0.7)
Eosinophils Relative: 2 % (ref 0–5)
HCT: 35.4 % — ABNORMAL LOW (ref 36.0–46.0)
Hemoglobin: 12.2 g/dL (ref 12.0–15.0)
Lymphocytes Relative: 25 % (ref 12–46)
Lymphs Abs: 1.6 10*3/uL (ref 0.7–4.0)
MCH: 29.3 pg (ref 26.0–34.0)
MCHC: 34.5 g/dL (ref 30.0–36.0)
MCV: 85.1 fL (ref 78.0–100.0)
MONOS PCT: 6 % (ref 3–12)
Monocytes Absolute: 0.4 10*3/uL (ref 0.1–1.0)
NEUTROS ABS: 4.2 10*3/uL (ref 1.7–7.7)
NEUTROS PCT: 67 % (ref 43–77)
PLATELETS: 292 10*3/uL (ref 150–400)
RBC: 4.16 MIL/uL (ref 3.87–5.11)
RDW: 13.3 % (ref 11.5–15.5)
WBC: 6.3 10*3/uL (ref 4.0–10.5)

## 2015-01-04 LAB — LIPASE, BLOOD: LIPASE: 25 U/L (ref 22–51)

## 2015-01-04 MED ORDER — HYDROMORPHONE HCL 1 MG/ML IJ SOLN
1.0000 mg | Freq: Once | INTRAMUSCULAR | Status: AC
  Administered 2015-01-04: 1 mg via INTRAVENOUS
  Filled 2015-01-04: qty 1
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
## 2015-01-04 NOTE — ED Notes (Signed)
MD at bedside. 

## 2015-01-04 NOTE — ED Notes (Signed)
Per EMS pt from home; pt c/o of abdominal x 4 days ago; pain feels like "contractions" with increased bleeding during cycle last week; pt c/o un frequent BM; Pt states moved up into back and now into chest starting today; Pt a&ox 4 on arrival pt c/o pain 10/10 with some cold chills; Pt has hx HTN, depression and hi Cholesterol;

## 2015-01-04 NOTE — ED Notes (Signed)
Pt placed in a gown and hooked up to the monitor with the 5 lead, BP cuff and pulse ox 

## 2015-01-04 NOTE — ED Notes (Signed)
Patient transported to X-ray 

## 2015-01-04 NOTE — Discharge Instructions (Signed)
Abdominal Pain °Many things can cause abdominal pain. Usually, abdominal pain is not caused by a disease and will improve without treatment. It can often be observed and treated at home. Your health care provider will do a physical exam and possibly order blood tests and X-rays to help determine the seriousness of your pain. However, in many cases, more time must pass before a clear cause of the pain can be found. Before that point, your health care provider may not know if you need more testing or further treatment. °HOME CARE INSTRUCTIONS  °Monitor your abdominal pain for any changes. The following actions may help to alleviate any discomfort you are experiencing: °· Only take over-the-counter or prescription medicines as directed by your health care provider. °· Do not take laxatives unless directed to do so by your health care provider. °· Try a clear liquid diet (broth, tea, or water) as directed by your health care provider. Slowly move to a bland diet as tolerated. °SEEK MEDICAL CARE IF: °· You have unexplained abdominal pain. °· You have abdominal pain associated with nausea or diarrhea. °· You have pain when you urinate or have a bowel movement. °· You experience abdominal pain that wakes you in the night. °· You have abdominal pain that is worsened or improved by eating food. °· You have abdominal pain that is worsened with eating fatty foods. °· You have a fever. °SEEK IMMEDIATE MEDICAL CARE IF:  °· Your pain does not go away within 2 hours. °· You keep throwing up (vomiting). °· Your pain is felt only in portions of the abdomen, such as the right side or the left lower portion of the abdomen. °· You pass bloody or black tarry stools. °MAKE SURE YOU: °· Understand these instructions.   °· Will watch your condition.   °· Will get help right away if you are not doing well or get worse.   °Document Released: 03/02/2005 Document Revised: 05/28/2013 Document Reviewed: 01/30/2013 °ExitCare® Patient Information  ©2015 [employer]. This information is not intended to replace advice given to you by your health care provider. Make sure you discuss any questions you have with your health care provider. ° °Constipation °Constipation is when a person has fewer than three bowel movements a week, has difficulty having a bowel movement, or has stools that are dry, hard, or larger than normal. As people grow older, constipation is more common. If you try to fix constipation with medicines that make you have a bowel movement (laxatives), the problem may get worse. Long-term laxative use may cause the muscles of the colon to become weak. A low-fiber diet, not taking in enough fluids, and taking certain medicines may make constipation worse.  °CAUSES  °· Certain medicines, such as antidepressants, pain medicine, iron supplements, antacids, and water pills.   °· Certain diseases, such as diabetes, irritable bowel syndrome (IBS), thyroid disease, or depression.   °· Not drinking enough water.   °· Not eating enough fiber-rich foods.   °· Stress or travel.   °· Lack of physical activity or exercise.   °· Ignoring the urge to have a bowel movement.   °· Using laxatives too much.   °SIGNS AND SYMPTOMS  °· Having fewer than three bowel movements a week.   °· Straining to have a bowel movement.   °· Having stools that are hard, dry, or larger than normal.   °· Feeling full or bloated.   °· Pain in the lower abdomen.   °· Not feeling relief after having a bowel movement.   °DIAGNOSIS  °Your health care provider will take   a medical history and perform a physical exam. Further testing may be done for severe constipation. Some tests may include: °· A barium enema X-ray to examine your rectum, colon, and, sometimes, your small intestine.   °· A sigmoidoscopy to examine your lower colon.   °· A colonoscopy to examine your entire colon. °TREATMENT  °Treatment will depend on the severity of your constipation and what is causing it. Some dietary  treatments include drinking more fluids and eating more fiber-rich foods. Lifestyle treatments may include regular exercise. If these diet and lifestyle recommendations do not help, your health care provider may recommend taking over-the-counter laxative medicines to help you have bowel movements. Prescription medicines may be prescribed if over-the-counter medicines do not work.  °HOME CARE INSTRUCTIONS  °· Eat foods that have a lot of fiber, such as fruits, vegetables, whole grains, and beans. °· Limit foods high in fat and processed sugars, such as french fries, hamburgers, cookies, candies, and soda.   °· A fiber supplement may be added to your diet if you cannot get enough fiber from foods.   °· Drink enough fluids to keep your urine clear or pale yellow.   °· Exercise regularly or as directed by your health care provider.   °· Go to the restroom when you have the urge to go. Do not hold it.   °· Only take over-the-counter or prescription medicines as directed by your health care provider. Do not take other medicines for constipation without talking to your health care provider first.   °SEEK IMMEDIATE MEDICAL CARE IF:  °· You have bright red blood in your stool.   °· Your constipation lasts for more than 4 days or gets worse.   °· You have abdominal or rectal pain.   °· You have thin, pencil-like stools.   °· You have unexplained weight loss. °MAKE SURE YOU:  °· Understand these instructions. °· Will watch your condition. °· Will get help right away if you are not doing well or get worse. °Document Released: 02/19/2004 Document Revised: 05/28/2013 Document Reviewed: 03/04/2013 °ExitCare® Patient Information ©2015 [employer]. This information is not intended to replace advice given to you by your health care provider. Make sure you discuss any questions you have with your health care provider. ° °

## 2015-01-05 LAB — GC/CHLAMYDIA PROBE AMP (~~LOC~~) NOT AT ARMC
CHLAMYDIA, DNA PROBE: NEGATIVE
NEISSERIA GONORRHEA: NEGATIVE

## 2015-01-21 ENCOUNTER — Ambulatory Visit (INDEPENDENT_AMBULATORY_CARE_PROVIDER_SITE_OTHER): Payer: Medicaid Other | Admitting: Obstetrics & Gynecology

## 2015-01-21 ENCOUNTER — Encounter: Payer: Self-pay | Admitting: Family Medicine

## 2015-01-21 VITALS — BP 120/78 | HR 58 | Temp 97.7°F | Resp 20 | Ht 66.0 in | Wt 283.1 lb

## 2015-01-21 DIAGNOSIS — N939 Abnormal uterine and vaginal bleeding, unspecified: Secondary | ICD-10-CM | POA: Diagnosis present

## 2015-01-21 DIAGNOSIS — N951 Menopausal and female climacteric states: Secondary | ICD-10-CM

## 2015-01-21 MED ORDER — MEGESTROL ACETATE 20 MG PO TABS: 40.0000 mg | ORAL_TABLET | Freq: Every day | ORAL | Status: DC

## 2015-01-21 NOTE — Progress Notes (Signed)
Pt states she has been having intolerable pain with her periods and the bleeding is very heavy.  The pain keeps her from her ADL.

## 2015-01-21 NOTE — Progress Notes (Signed)
Patient ID: Rebekah Howard, female   DOB: [birth year], 54 y.o.   MRN: [id number] History:  54 y.o. G3P3003 here today for irreg menses.  She reports that she was bleeding for 5 days heavy.  Prior to that she had a period 2-3 months prev and that was only for 2 days.  She reports hot flushes and vaginal dryness.    The following portions of the patient's history were reviewed and updated as appropriate: allergies, current medications, past family history, past medical history, past social history, past surgical history and problem list.  Review of Systems:  Pertinent items are noted in HPI.  Objective:  Physical Exam Blood pressure 120/78, pulse 58, temperature 97.7 F (36.5 C), temperature source Oral, resp. rate 20, height 5\' 6"  (1.676 m), weight 283 lb 1.6 oz (128.413 kg), last menstrual period 12/30/2014. Gen: NAD Abd: Soft, nontender and nondistended Pelvic: Normal appearing external genitalia; normal appearing vaginal mucosa and cervix.  Normal discharge.  Small uterus, no other palpable masses, no uterine or adnexal tenderness    Assessment & Plan:  Pt with AUB and it appears to be from perimenopause.  Pt has had fewer menses and menopausal sx  Megace 40mg  daily. Replens prn Pelvic sono F/u in 3 months or sooner prn F/u PAP Rec tobacco cessation   Ruthel Martine L. Harraway-Smith, M.D., Cherlynn June

## 2015-01-21 NOTE — Progress Notes (Signed)
[place] scheduled for 01/30/15 @ 1300.  Pt notified.

## 2015-01-21 NOTE — Patient Instructions (Signed)
Perimenopause Perimenopause is the time when your body begins to move into the menopause (no menstrual period for 12 straight months). It is a natural process. Perimenopause can begin 2-8 years before the menopause and usually lasts for 1 year after the menopause. During this time, your ovaries may or may not produce an egg. The ovaries vary in their production of estrogen and progesterone hormones each month. This can cause irregular menstrual periods, difficulty getting pregnant, vaginal bleeding between periods, and uncomfortable symptoms. CAUSES  Irregular production of the ovarian hormones, estrogen and progesterone, and not ovulating every month.  Other causes include:  Tumor of the pituitary gland in the brain.  Medical disease that affects the ovaries.  Radiation treatment.  Chemotherapy.  Unknown causes.  Heavy smoking and excessive alcohol intake can bring on perimenopause sooner. SIGNS AND SYMPTOMS   Hot flashes.  Night sweats.  Irregular menstrual periods.  Decreased sex drive.  Vaginal dryness.  Headaches.  Mood swings.  Depression.  Memory problems.  Irritability.  Tiredness.  Weight gain.  Trouble getting pregnant.  The beginning of losing bone cells (osteoporosis).  The beginning of hardening of the arteries (atherosclerosis). DIAGNOSIS  Your health care provider will make a diagnosis by analyzing your age, menstrual history, and symptoms. He or she will do a physical exam and note any changes in your body, especially your female organs. Female hormone tests may or may not be helpful depending on the amount of female hormones you produce and when you produce them. However, other hormone tests may be helpful to rule out other problems. TREATMENT  In some cases, no treatment is needed. The decision on whether treatment is necessary during the perimenopause should be made by you and your health care provider based on how the symptoms are affecting you  and your lifestyle. Various treatments are available, such as:  Treating individual symptoms with a specific medicine for that symptom.  Herbal medicines that can help specific symptoms.  Counseling.  Group therapy. HOME CARE INSTRUCTIONS   Keep track of your menstrual periods (when they occur, how heavy they are, how long between periods, and how long they last) as well as your symptoms and when they started.  Only take over-the-counter or prescription medicines as directed by your health care provider.  Sleep and rest.  Exercise.  Eat a diet that contains calcium (good for your bones) and soy (acts like the estrogen hormone).  Do not smoke.  Avoid alcoholic beverages.  Take vitamin supplements as recommended by your health care provider. Taking vitamin E may help in certain cases.  Take calcium and vitamin D supplements to help prevent bone loss.  Group therapy is sometimes helpful.  Acupuncture may help in some cases. SEEK MEDICAL CARE IF:   You have questions about any symptoms you are having.  You need a referral to a specialist (gynecologist, psychiatrist, or psychologist). SEEK IMMEDIATE MEDICAL CARE IF:   You have vaginal bleeding.  Your period lasts longer than 8 days.  Your periods are recurring sooner than 21 days.  You have bleeding after intercourse.  You have severe depression.  You have pain when you urinate.  You have severe headaches.  You have vision problems. Document Released: 06/30/2004 Document Revised: 03/13/2013 Document Reviewed: 12/20/2012 [facility] Patient Information 2015 [place], [place]. This information is not intended to replace advice given to you by your health care provider. Make sure you discuss any questions you have with your health care provider. Dysfunctional Uterine Bleeding Normally, menstrual  periods begin between ages 61 to 64 in young women. A normal menstrual cycle/period may begin every 23 days up to 35 days and  lasts from 1 to 7 days. Around 12 to 14 days before your menstrual period starts, ovulation (ovary produces an egg) occurs. When counting the time between menstrual periods, count from the first day of bleeding of the previous period to the first day of bleeding of the next period. Dysfunctional (abnormal) uterine bleeding is bleeding that is different from a normal menstrual period. Your periods may come earlier or later than usual. They may be lighter, have blood clots or be heavier. You may have bleeding between periods, or you may skip one period or more. You may have bleeding after sexual intercourse, bleeding after menopause, or no menstrual period. CAUSES   Pregnancy (normal, miscarriage, tubal).  IUDs (intrauterine device, birth control).  Birth control pills.  Hormone treatment.  Menopause.  Infection of the cervix.  Blood clotting problems.  Infection of the inside lining of the uterus.  Endometriosis, inside lining of the uterus growing in the pelvis and other female organs.  Adhesions (scar tissue) inside the uterus.  Obesity or severe weight loss.  Uterine polyps inside the uterus.  Cancer of the vagina, cervix, or uterus.  Ovarian cysts or polycystic ovary syndrome.  Medical problems (diabetes, thyroid disease).  Uterine fibroids (noncancerous tumor).  Problems with your female hormones.  Endometrial hyperplasia, very thick lining and enlarged cells inside of the uterus.  Medicines that interfere with ovulation.  Radiation to the pelvis or abdomen.  Chemotherapy. DIAGNOSIS   Your doctor will discuss the history of your menstrual periods, medicines you are taking, changes in your weight, stress in your life, and any medical problems you may have.  Your doctor will do a physical and pelvic examination.  Your doctor may want to perform certain tests to make a diagnosis, such as:  Pap test.  Blood tests.  Cultures for infection.  CT  scan.  Ultrasound.  Hysteroscopy.  Laparoscopy.  MRI.  Hysterosalpingography.  D and C.  Endometrial biopsy. TREATMENT  Treatment will depend on the cause of the dysfunctional uterine bleeding (DUB). Treatment may include:  Observing your menstrual periods for a couple of months.  Prescribing medicines for medical problems, including:  Antibiotics.  Hormones.  Birth control pills.  Removing an IUD (intrauterine device, birth control).  Surgery:  D and C (scrape and remove tissue from inside the uterus).  Laparoscopy (examine inside the abdomen with a lighted tube).  Uterine ablation (destroy lining of the uterus with electrical current, laser, heat, or freezing).  Hysteroscopy (examine cervix and uterus with a lighted tube).  Hysterectomy (remove the uterus). HOME CARE INSTRUCTIONS   If medicines were prescribed, take exactly as directed. Do not change or switch medicines without consulting your caregiver.  Long term heavy bleeding may result in iron deficiency. Your caregiver may have prescribed iron pills. They help replace the iron that your body lost from heavy bleeding. Take exactly as directed.  Do not take aspirin or medicines that contain aspirin one week before or during your menstrual period. Aspirin may make the bleeding worse.  If you need to change your sanitary pad or tampon more than once every 2 hours, stay in bed with your feet elevated and a cold pack on your lower abdomen. Rest as much as possible, until the bleeding stops or slows down.  Eat well-balanced meals. Eat foods high in iron. Examples are:  Leafy green  vegetables.  Whole-grain breads and cereals.  Eggs.  Meat.  Liver.  Do not try to lose weight until the abnormal bleeding has stopped and your blood iron level is back to normal. Do not lift more than ten pounds or do strenuous activities when you are bleeding.  For a couple of months, make note on your calendar, marking the  start and ending of your period, and the type of bleeding (light, medium, heavy, spotting, clots or missed periods). This is for your caregiver to better evaluate your problem. SEEK MEDICAL CARE IF:   You develop nausea (feeling sick to your stomach) and vomiting, dizziness, or diarrhea while you are taking your medicine.  You are getting lightheaded or weak.  You have any problems that may be related to the medicine you are taking.  You develop pain with your DUB.  You want to remove your IUD.  You want to stop or change your birth control pills or hormones.  You have any type of abnormal bleeding mentioned above.  You are over 65 years old and have not had a menstrual period yet.  You are 54 years old and you are still having menstrual periods.  You have any of the symptoms mentioned above.  You develop a rash. SEEK IMMEDIATE MEDICAL CARE IF:   An oral temperature above 102 F (38.9 C) develops.  You develop chills.  You are changing your sanitary pad or tampon more than once an hour.  You develop abdominal pain.  You pass out or faint. Document Released: 05/20/2000 Document Revised: 08/15/2011 Document Reviewed: 04/21/2009 [facility] Patient Information 2015 [place], [place]. This information is not intended to replace advice given to you by your health care provider. Make sure you discuss any questions you have with your health care provider. Smoking Cessation Quitting smoking is important to your health and has many advantages. However, it is not always easy to quit since nicotine is a very addictive drug. Oftentimes, people try 3 times or more before being able to quit. This document explains the best ways for you to prepare to quit smoking. Quitting takes hard work and a lot of effort, but you can do it. ADVANTAGES OF QUITTING SMOKING  You will live longer, feel better, and live better.  Your body will feel the impact of quitting smoking almost immediately.  Within  20 minutes, blood pressure decreases. Your pulse returns to its normal level.  After 8 hours, carbon monoxide levels in the blood return to normal. Your oxygen level increases.  After 24 hours, the chance of having a heart attack starts to decrease. Your breath, hair, and body stop smelling like smoke.  After 48 hours, damaged nerve endings begin to recover. Your sense of taste and smell improve.  After 72 hours, the body is virtually free of nicotine. Your bronchial tubes relax and breathing becomes easier.  After 2 to 12 weeks, lungs can hold more air. Exercise becomes easier and circulation improves.  The risk of having a heart attack, stroke, cancer, or lung disease is greatly reduced.  After 1 year, the risk of coronary heart disease is cut in half.  After 5 years, the risk of stroke falls to the same as a nonsmoker.  After 10 years, the risk of lung cancer is cut in half and the risk of other cancers decreases significantly.  After 15 years, the risk of coronary heart disease drops, usually to the level of a nonsmoker.  If you are pregnant, quitting smoking will  improve your chances of having a healthy baby.  The people you live with, especially any children, will be healthier.  You will have extra money to spend on things other than cigarettes. QUESTIONS TO THINK ABOUT BEFORE ATTEMPTING TO QUIT You may want to talk about your answers with your health care provider.  Why do you want to quit?  If you tried to quit in the past, what helped and what did not?  What will be the most difficult situations for you after you quit? How will you plan to handle them?  Who can help you through the tough times? Your family? Friends? A health care provider?  What pleasures do you get from smoking? What ways can you still get pleasure if you quit? Here are some questions to ask your health care provider:  How can you help me to be successful at quitting?  What medicine do you think  would be best for me and how should I take it?  What should I do if I need more help?  What is smoking withdrawal like? How can I get information on withdrawal? GET READY  Set a quit date.  Change your environment by getting rid of all cigarettes, ashtrays, matches, and lighters in your home, car, or work. Do not let people smoke in your home.  Review your past attempts to quit. Think about what worked and what did not. GET SUPPORT AND ENCOURAGEMENT You have a better chance of being successful if you have help. You can get support in many ways.  Tell your family, friends, and coworkers that you are going to quit and need their support. Ask them not to smoke around you.  Get individual, group, or telephone counseling and support. Programs are available at [employer] and health centers. Call your local health department for information about programs in your area.  Spiritual beliefs and practices may help some smokers quit.  Download a "quit meter" on your computer to keep track of quit statistics, such as how long you have gone without smoking, cigarettes not smoked, and money saved.  Get a self-help book about quitting smoking and staying off tobacco. [place] yourself from urges to smoke. Talk to someone, go for a walk, or occupy your time with a task.  Change your normal routine. Take a different route to work. Drink tea instead of coffee. Eat breakfast in a different place.  Reduce your stress. Take a hot bath, exercise, or read a book.  Plan something enjoyable to do every day. Reward yourself for not smoking.  Explore interactive web-based programs that specialize in helping you quit. GET MEDICINE AND USE IT CORRECTLY Medicines can help you stop smoking and decrease the urge to smoke. Combining medicine with the above behavioral methods and support can greatly increase your chances of successfully quitting smoking.  Nicotine replacement  therapy helps deliver nicotine to your body without the negative effects and risks of smoking. Nicotine replacement therapy includes nicotine gum, lozenges, inhalers, nasal sprays, and skin patches. Some may be available over-the-counter and others require a prescription.  Antidepressant medicine helps people abstain from smoking, but how this works is unknown. This medicine is available by prescription.  Nicotinic receptor partial agonist medicine simulates the effect of nicotine in your brain. This medicine is available by prescription. Ask your health care provider for advice about which medicines to use and how to use them based on your health history. Your health care provider will  tell you what side effects to look out for if you choose to be on a medicine or therapy. Carefully read the information on the package. Do not use any other product containing nicotine while using a nicotine replacement product.  RELAPSE OR DIFFICULT SITUATIONS Most relapses occur within the first 3 months after quitting. Do not be discouraged if you start smoking again. Remember, most people try several times before finally quitting. You may have symptoms of withdrawal because your body is used to nicotine. You may crave cigarettes, be irritable, feel very hungry, cough often, get headaches, or have difficulty concentrating. The withdrawal symptoms are only temporary. They are strongest when you first quit, but they will go away within 10-14 days. To reduce the chances of relapse, try to:  Avoid drinking alcohol. Drinking lowers your chances of successfully quitting.  Reduce the amount of caffeine you consume. Once you quit smoking, the amount of caffeine in your body increases and can give you symptoms, such as a rapid heartbeat, sweating, and anxiety.  Avoid smokers because they can make you want to smoke.  Do not let weight gain distract you. Many smokers will gain weight when they quit, usually less than 10  pounds. Eat a healthy diet and stay active. You can always lose the weight gained after you quit.  Find ways to improve your mood other than smoking. FOR MORE INFORMATION  www.smokefree.gov  Document Released: 05/17/2001 Document Revised: 10/07/2013 Document Reviewed: 09/01/2011 [facility] Patient Information 2015 [place], [place]. This information is not intended to replace advice given to you by your health care provider. Make sure you discuss any questions you have with your health care provider.

## 2015-01-23 ENCOUNTER — Other Ambulatory Visit (HOSPITAL_COMMUNITY)
Admission: RE | Admit: 2015-01-23 | Discharge: 2015-01-23 | Disposition: A | Discharge status: Home / Self Care | Payer: Medicaid Other | Source: Ambulatory Visit | Attending: Obstetrics & Gynecology | Admitting: Obstetrics & Gynecology

## 2015-01-23 DIAGNOSIS — Z01419 Encounter for gynecological examination (general) (routine) without abnormal findings: Secondary | ICD-10-CM | POA: Insufficient documentation

## 2015-01-23 DIAGNOSIS — Z1151 Encounter for screening for human papillomavirus (HPV): Secondary | ICD-10-CM | POA: Diagnosis present

## 2015-01-23 NOTE — Addendum Note (Signed)
Addended by: Excell Seltzer on: 01/23/2015 08:43 AM   Modules accepted: Orders

## 2015-01-26 LAB — CYTOLOGY - PAP

## 2015-01-30 ENCOUNTER — Ambulatory Visit (HOSPITAL_COMMUNITY)
Admission: RE | Admit: 2015-01-30 | Discharge: 2015-01-30 | Disposition: A | Discharge status: Home / Self Care | Payer: Medicaid Other | Source: Ambulatory Visit | Attending: Obstetrics & Gynecology | Admitting: Obstetrics & Gynecology

## 2015-01-30 ENCOUNTER — Other Ambulatory Visit: Payer: Self-pay | Admitting: Internal Medicine

## 2015-01-30 DIAGNOSIS — N939 Abnormal uterine and vaginal bleeding, unspecified: Secondary | ICD-10-CM

## 2015-02-02 ENCOUNTER — Telehealth: Payer: Self-pay

## 2015-02-02 NOTE — Telephone Encounter (Signed)
Pts' pharmacy called and stated that the Rx that was for Megestrol take two tablets (40 mg) bid is only written for qty 30 lasting only 15 days.  Jarrett Soho, pharmacist, wanted to know if she could get a 30 day supply rather than a 15 day supply. Per Dr. Ihor Dow note pt is take Megace 40 mg po once daily.  So advised Jarrett Soho on permitting the 30 day supply for qty 55.

## 2015-02-03 ENCOUNTER — Telehealth: Payer: Self-pay | Admitting: General Practice

## 2015-02-03 NOTE — Telephone Encounter (Addendum)
Per Dr Ihor Dow, patient's ultrasound was normal and she should continue megace. Called patient, no answer- left message stating we are trying to reach you with non urgent results, please call us back at the clinics  8/30  1415  Spoke w/pt and informed her of [place] results and recommendation from Dr. Ihor Dow that she continue taking Megace as prescribed. Per note on 8/17, pt should have f/u appt in mid-November.  Pt advised to call clinic in October to schedule her f/u visit.  Pt agreed and voiced understanding of all information and instructions given.  Diane Day RNC

## 2015-02-06 ENCOUNTER — Telehealth: Payer: Self-pay | Admitting: *Deleted

## 2015-02-06 NOTE — Telephone Encounter (Addendum)
Received message left on nurse line on 02/05/15 at 1526.  Patient states she was given a medication that starts with a "M" that she can't pronounce.  States she is having side effects of weight gain and mood swings.  Requests a return call.  9/2  1045  I returned pt's call after consult with Dr. Ihor Dow. I informed pt that the low dose of Megace which was prescribed would not be the cause of her weight gain and mood swings. Dr. Ihor Dow recommends that she continue the Megace for her abnormal bleeding and watch her diet carefully. Pt stated that she has been watching her diet and also exercises. She proceeded to explain that she "feels like" she has gained weight and "just feels different". She confirmed that she has not weighed herself and has no scale at home. I stated that pt may come to the clinic to be weighed at any time. She does not need a scheduled visit in order have the nurse check her weight. Pt further stated that she has not been taking the medication for about a week and her bleeding has stopped. I advised that she may refrain from the medication for now and resume if her abnormal bleeding returns or if her next period is abnormally heavy.  Pt voiced understanding of all information and instructions given.

## 2015-04-06 ENCOUNTER — Encounter (HOSPITAL_COMMUNITY): Payer: Self-pay | Admitting: Oncology

## 2015-04-06 ENCOUNTER — Emergency Department (HOSPITAL_COMMUNITY)
Admission: EM | Admit: 2015-04-06 | Discharge: 2015-04-06 | Disposition: A | Discharge status: Home / Self Care | Payer: Medicaid Other | Attending: Emergency Medicine | Admitting: Emergency Medicine

## 2015-04-06 DIAGNOSIS — Z872 Personal history of diseases of the skin and subcutaneous tissue: Secondary | ICD-10-CM | POA: Insufficient documentation

## 2015-04-06 DIAGNOSIS — I10 Essential (primary) hypertension: Secondary | ICD-10-CM | POA: Diagnosis not present

## 2015-04-06 DIAGNOSIS — Z8719 Personal history of other diseases of the digestive system: Secondary | ICD-10-CM | POA: Insufficient documentation

## 2015-04-06 DIAGNOSIS — Z8619 Personal history of other infectious and parasitic diseases: Secondary | ICD-10-CM | POA: Diagnosis not present

## 2015-04-06 DIAGNOSIS — Z91040 Latex allergy status: Secondary | ICD-10-CM | POA: Diagnosis not present

## 2015-04-06 DIAGNOSIS — F329 Major depressive disorder, single episode, unspecified: Secondary | ICD-10-CM | POA: Insufficient documentation

## 2015-04-06 DIAGNOSIS — Z720 Tobacco use: Secondary | ICD-10-CM | POA: Diagnosis not present

## 2015-04-06 DIAGNOSIS — E785 Hyperlipidemia, unspecified: Secondary | ICD-10-CM | POA: Insufficient documentation

## 2015-04-06 DIAGNOSIS — M545 Low back pain: Secondary | ICD-10-CM | POA: Diagnosis present

## 2015-04-06 DIAGNOSIS — Z79899 Other long term (current) drug therapy: Secondary | ICD-10-CM | POA: Insufficient documentation

## 2015-04-06 DIAGNOSIS — Z880 Allergy status to penicillin: Secondary | ICD-10-CM | POA: Diagnosis not present

## 2015-04-06 DIAGNOSIS — M5432 Sciatica, left side: Secondary | ICD-10-CM | POA: Diagnosis not present

## 2015-04-06 MED ORDER — CYCLOBENZAPRINE HCL 10 MG PO TABS: 10.0000 mg | ORAL_TABLET | Freq: Three times a day (TID) | ORAL | Status: DC | PRN

## 2015-04-06 MED ORDER — HYDROCODONE-ACETAMINOPHEN 5-325 MG PO TABS
2.0000 | ORAL_TABLET | Freq: Once | ORAL | Status: AC
Start: 2015-04-06 — End: 2015-04-06
  Administered 2015-04-06: 2 via ORAL
  Filled 2015-04-06: qty 2

## 2015-04-06 MED ORDER — IBUPROFEN 600 MG PO TABS: 600.0000 mg | ORAL_TABLET | Freq: Three times a day (TID) | ORAL | Status: DC | PRN

## 2015-04-06 MED ORDER — HYDROCODONE-ACETAMINOPHEN 5-325 MG PO TABS: 1.0000 | ORAL_TABLET | ORAL | Status: DC | PRN

## 2015-04-06 NOTE — ED Notes (Signed)
Per [facility] pt has been experiencing lower left back pain radiating down left leg x 1 week however it became progressively worse tonight.  Pt ambulatory from ambulance to bed.

## 2015-04-06 NOTE — Discharge Instructions (Signed)
Return to the ER if your symptoms worsen or change in any concerning way. If you have trouble urinating or blood in her urine come back to the ER or see your primary doctor. If you develop weakness or numbness or trouble with your bowels or bladder come back to the ER immediately. Do not take the norco (hydrocodone) and then operate heavy machinery or drive. Do not operate heavy machinery or drive with the flexeril either. These can both cause drowsiness, dizziness or other symptoms, do not take with other drugs or alcohol
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
## 2015-04-06 NOTE — ED Notes (Signed)
Bed: WA24 Expected date:  Expected time:  Means of arrival:  Comments: [facility] 

## 2015-04-06 NOTE — ED Notes (Signed)
Pt was able to find ride back home.

## 2015-05-04 ENCOUNTER — Other Ambulatory Visit: Payer: Self-pay

## 2015-05-04 DIAGNOSIS — Z1231 Encounter for screening mammogram for malignant neoplasm of breast: Secondary | ICD-10-CM

## 2015-05-22 ENCOUNTER — Ambulatory Visit
Admission: RE | Admit: 2015-05-22 | Discharge: 2015-05-22 | Disposition: A | Discharge status: Home / Self Care | Payer: Medicaid Other | Source: Ambulatory Visit

## 2015-05-22 DIAGNOSIS — Z1231 Encounter for screening mammogram for malignant neoplasm of breast: Secondary | ICD-10-CM

## 2015-06-07 ENCOUNTER — Inpatient Hospital Stay (HOSPITAL_COMMUNITY)
Admission: AD | Admit: 2015-06-07 | Discharge: 2015-06-07 | Disposition: A | Discharge status: Home / Self Care | Payer: Medicaid Other | Source: Ambulatory Visit | Attending: Obstetrics and Gynecology | Admitting: Obstetrics and Gynecology

## 2015-06-07 ENCOUNTER — Encounter (HOSPITAL_COMMUNITY): Payer: Self-pay

## 2015-06-07 DIAGNOSIS — F329 Major depressive disorder, single episode, unspecified: Secondary | ICD-10-CM | POA: Diagnosis not present

## 2015-06-07 DIAGNOSIS — E785 Hyperlipidemia, unspecified: Secondary | ICD-10-CM | POA: Diagnosis not present

## 2015-06-07 DIAGNOSIS — N760 Acute vaginitis: Secondary | ICD-10-CM | POA: Diagnosis not present

## 2015-06-07 DIAGNOSIS — I10 Essential (primary) hypertension: Secondary | ICD-10-CM | POA: Insufficient documentation

## 2015-06-07 DIAGNOSIS — N939 Abnormal uterine and vaginal bleeding, unspecified: Secondary | ICD-10-CM | POA: Diagnosis present

## 2015-06-07 DIAGNOSIS — B9689 Other specified bacterial agents as the cause of diseases classified elsewhere: Secondary | ICD-10-CM

## 2015-06-07 DIAGNOSIS — A499 Bacterial infection, unspecified: Secondary | ICD-10-CM

## 2015-06-07 DIAGNOSIS — M1990 Unspecified osteoarthritis, unspecified site: Secondary | ICD-10-CM | POA: Insufficient documentation

## 2015-06-07 DIAGNOSIS — F17210 Nicotine dependence, cigarettes, uncomplicated: Secondary | ICD-10-CM | POA: Diagnosis not present

## 2015-06-07 DIAGNOSIS — N951 Menopausal and female climacteric states: Secondary | ICD-10-CM

## 2015-06-07 LAB — URINE MICROSCOPIC-ADD ON

## 2015-06-07 LAB — CBC
HEMATOCRIT: 39.8 % (ref 36.0–46.0)
HEMOGLOBIN: 14.1 g/dL (ref 12.0–15.0)
MCH: 30.3 pg (ref 26.0–34.0)
MCHC: 35.4 g/dL (ref 30.0–36.0)
MCV: 85.6 fL (ref 78.0–100.0)
PLATELETS: 259 10*3/uL (ref 150–400)
RBC: 4.65 MIL/uL (ref 3.87–5.11)
RDW: 14.2 % (ref 11.5–15.5)
WBC: 6 10*3/uL (ref 4.0–10.5)

## 2015-06-07 LAB — URINALYSIS, ROUTINE W REFLEX MICROSCOPIC
BILIRUBIN URINE: NEGATIVE
GLUCOSE, UA: NEGATIVE mg/dL
KETONES UR: NEGATIVE mg/dL
Leukocytes, UA: NEGATIVE
Nitrite: NEGATIVE
Protein, ur: NEGATIVE mg/dL
Specific Gravity, Urine: 1.025 (ref 1.005–1.030)
pH: 5.5 (ref 5.0–8.0)

## 2015-06-07 LAB — WET PREP, GENITAL
SPERM: NONE SEEN
Trich, Wet Prep: NONE SEEN
Yeast Wet Prep HPF POC: NONE SEEN

## 2015-06-07 LAB — POCT PREGNANCY, URINE: Preg Test, Ur: NEGATIVE

## 2015-06-07 MED ORDER — METRONIDAZOLE 500 MG PO TABS: 500.0000 mg | ORAL_TABLET | Freq: Two times a day (BID) | ORAL | Status: DC

## 2015-06-07 MED ORDER — MEGESTROL ACETATE 40 MG PO TABS: 40.0000 mg | ORAL_TABLET | Freq: Two times a day (BID) | ORAL | Status: DC

## 2015-06-07 NOTE — MAU Note (Addendum)
Patient presents with c/o vaginal bleeding that started December 8th. Patient states she changes her pad about 10 times a day.

## 2015-06-07 NOTE — Discharge Instructions (Signed)
Abnormal Uterine Bleeding Abnormal uterine bleeding can affect women at various stages in life, including teenagers, women in their reproductive years, pregnant women, and women who have reached menopause. Several kinds of uterine bleeding are considered abnormal, including:  Bleeding or spotting between periods.   Bleeding after sexual intercourse.   Bleeding that is heavier or more than normal.   Periods that last longer than usual.  Bleeding after menopause.  Many cases of abnormal uterine bleeding are minor and simple to treat, while others are more serious. Any type of abnormal bleeding should be evaluated by your health care provider. Treatment will depend on the cause of the bleeding. HOME CARE INSTRUCTIONS Monitor your condition for any changes. The following actions may help to alleviate any discomfort you are experiencing:  Avoid the use of tampons and douches as directed by your health care provider.  Change your pads frequently. You should get regular pelvic exams and Pap tests. Keep all follow-up appointments for diagnostic tests as directed by your health care provider.  SEEK MEDICAL CARE IF:   Your bleeding lasts more than 1 week.   You feel dizzy at times.  SEEK IMMEDIATE MEDICAL CARE IF:   You pass out.   You are changing pads every 15 to 30 minutes.   You have abdominal pain.  You have a fever.   You become sweaty or weak.   You are passing large blood clots from the vagina.   You start to feel nauseous and vomit. MAKE SURE YOU:   Understand these instructions.  Will watch your condition.  Will get help right away if you are not doing well or get worse.   This information is not intended to replace advice given to you by your health care provider. Make sure you discuss any questions you have with your health care provider.   Document Released: 05/23/2005 Document Revised: 05/28/2013 Document Reviewed: 12/20/2012 Elsevier Interactive  Patient Education 2016 Elsevier Inc.  Bacterial Vaginosis Bacterial vaginosis is an infection of the vagina. It happens when too many germs (bacteria) grow in the vagina. Having this infection puts you at risk for getting other infections from sex. Treating this infection can help lower your risk for other infections, such as:   Chlamydia.  Gonorrhea.  HIV.  Herpes. HOME CARE  Take your medicine as told by your doctor.  Finish your medicine even if you start to feel better.  Tell your sex partner that you have an infection. They should see their doctor for treatment.  During treatment:  Avoid sex or use condoms correctly.  Do not douche.  Do not drink alcohol unless your doctor tells you it is ok.  Do not breastfeed unless your doctor tells you it is ok. GET HELP IF:  You are not getting better after 3 days of treatment.  You have more grey fluid (discharge) coming from your vagina than before.  You have more pain than before.  You have a fever. MAKE SURE YOU:   Understand these instructions.  Will watch your condition.  Will get help right away if you are not doing well or get worse.   This information is not intended to replace advice given to you by your health care provider. Make sure you discuss any questions you have with your health care provider.   Document Released: 03/01/2008 Document Revised: 06/13/2014 Document Reviewed: 01/02/2013 Elsevier Interactive Patient Education [employer].
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
## 2015-06-09 LAB — GC/CHLAMYDIA PROBE AMP (~~LOC~~) NOT AT ARMC
CHLAMYDIA, DNA PROBE: NEGATIVE
NEISSERIA GONORRHEA: NEGATIVE

## 2015-06-26 ENCOUNTER — Ambulatory Visit (INDEPENDENT_AMBULATORY_CARE_PROVIDER_SITE_OTHER): Payer: Medicaid Other | Admitting: Obstetrics & Gynecology

## 2015-06-26 ENCOUNTER — Encounter: Payer: Self-pay | Admitting: Obstetrics & Gynecology

## 2015-06-26 ENCOUNTER — Other Ambulatory Visit (HOSPITAL_COMMUNITY)
Admission: RE | Admit: 2015-06-26 | Discharge: 2015-06-26 | Disposition: A | Discharge status: Home / Self Care | Payer: Medicaid Other | Source: Ambulatory Visit | Attending: Obstetrics & Gynecology | Admitting: Obstetrics & Gynecology

## 2015-06-26 VITALS — BP 126/80 | HR 76 | Temp 97.5°F | Wt 274.5 lb

## 2015-06-26 DIAGNOSIS — Z113 Encounter for screening for infections with a predominantly sexual mode of transmission: Secondary | ICD-10-CM

## 2015-06-26 DIAGNOSIS — N939 Abnormal uterine and vaginal bleeding, unspecified: Secondary | ICD-10-CM | POA: Insufficient documentation

## 2015-06-26 NOTE — Patient Instructions (Signed)

## 2015-06-26 NOTE — Progress Notes (Signed)
Patient ID: Rebekah Howard, female   DOB: August 22, 1960, 55 y.o.   MRN: [id number] History:  55 y.o. G3P3003 here today for eval of abnormal discharge.  She is worried about STI's.  She reports that she was taking megace but, then stopped because her bleeding ceased.  She reports the discharge as dark.  She denies abd pain.       The following portions of the patient's history were reviewed and updated as appropriate: allergies, current medications, past family history, past medical history, past social history, past surgical history and problem list.  Review of Systems:  Pertinent items are noted in HPI.  Objective:  Physical Exam Blood pressure 126/80, pulse 76, temperature 97.5 F (36.4 C), weight 274 lb 8 oz (124.512 kg). Gen: NAD Abd: Soft, nontender and nondistended Pelvic: Normal appearing external genitalia; normal appearing vaginal mucosa and cervix.  Old blood noted coming from cervical os.  Small uterus, no other palpable masses, no uterine or adnexal tenderness  The indications for endometrial biopsy were reviewed.   Risks of the biopsy including cramping, bleeding, infection, uterine perforation, inadequate specimen and need for additional procedures  were discussed. The patient states she understands and agrees to undergo procedure today. Consent was signed. Time out was performed. Urine HCG was negative. A sterile speculum was placed in the patient's vagina and the cervix was prepped with Betadine. A single-toothed tenaculum was placed on the anterior lip of the cervix to stabilize it. The 3 mm pipelle was introduced into the endometrial cavity without difficulty to a depth of 8cm, and a moderate amount of tissue was obtained and sent to pathology. The instruments were removed from the patient's vagina. Minimal bleeding from the cervix was noted. The patient tolerated the procedure well.   Labs and Imaging 01/30/2015 CLINICAL DATA: Abnormal uterine bleeding for 5 days.  Previous C-section x3. Premenopausal. Hepatitis-B. LMP 01/16/2015.  EXAM: TRANSABDOMINAL AND TRANSVAGINAL ULTRASOUND OF PELVIS  TECHNIQUE: Both transabdominal and transvaginal ultrasound examinations of the pelvis were performed. Transabdominal technique was performed for global imaging of the pelvis including uterus, ovaries, adnexal regions, and pelvic cul-de-sac. It was necessary to proceed with endovaginal exam following the transabdominal exam to visualize the endometrium and ovaries.  COMPARISON: 08/17/2011  FINDINGS: Uterus  Measurements: 10.5 x 4.3 x 4.6 cm. No fibroids or other mass visualized.  Endometrium  Thickness: 7.0 mm. No focal abnormality visualized.  Right ovary  Measurements: 2.1 x 2.3 x 2.5 cm. Normal appearance/no adnexal mass.  Left ovary  Measurements: 2.5 x 1.3 x 1.5 cm. Normal appearance/no adnexal mass.  Other findings  No free fluid.  IMPRESSION: 1. Normal uterus and endometrium. 2. If bleeding remains unresponsive to hormonal or medical therapy, sonohysterogram should be considered for focal lesion work-up. (Ref: Radiological Reasoning: Algorithmic Workup of Abnormal Vaginal Bleeding with Endovaginal Sonography and Sonohysterography. AJR 2008; [id number]) 3. Normal appearance of the ovaries  Assessment & Plan:  AUB. Pt has been taking Megace but, stopped due no bleeding S/p Endometrial biopsy today results pending Megace 40mg  daily  Pt requests STI screening.  Had cultures done in the ED want blood work HIV, RPR and Hep panel done today  Rebekah Howard, M.D., Rebekah Howard

## 2015-06-27 LAB — HEPATITIS PANEL, ACUTE
HCV AB: NEGATIVE
HEP A IGM: NONREACTIVE
HEP B S AG: NEGATIVE
Hep B C IgM: NONREACTIVE

## 2015-06-27 LAB — HIV ANTIBODY (ROUTINE TESTING W REFLEX): HIV: NONREACTIVE

## 2015-06-27 LAB — RPR

## 2015-06-29 ENCOUNTER — Telehealth: Payer: Self-pay | Admitting: *Deleted

## 2015-06-29 NOTE — Telephone Encounter (Signed)
Called patient to inform her of negative STD panel. Left voicemail to call us back for non urgent results.

## 2015-06-29 NOTE — Telephone Encounter (Signed)
Pt informed of test results.

## 2015-06-30 ENCOUNTER — Other Ambulatory Visit: Payer: Self-pay | Admitting: Internal Medicine

## 2015-07-14 ENCOUNTER — Ambulatory Visit (INDEPENDENT_AMBULATORY_CARE_PROVIDER_SITE_OTHER): Payer: Medicaid Other | Admitting: Internal Medicine

## 2015-07-14 ENCOUNTER — Encounter: Payer: Self-pay | Admitting: Internal Medicine

## 2015-07-14 VITALS — BP 120/76 | HR 74 | Temp 98.6°F | Wt 276.3 lb

## 2015-07-14 DIAGNOSIS — E785 Hyperlipidemia, unspecified: Secondary | ICD-10-CM

## 2015-07-14 DIAGNOSIS — Z720 Tobacco use: Secondary | ICD-10-CM

## 2015-07-14 DIAGNOSIS — F17210 Nicotine dependence, cigarettes, uncomplicated: Secondary | ICD-10-CM | POA: Diagnosis not present

## 2015-07-14 DIAGNOSIS — Z23 Encounter for immunization: Secondary | ICD-10-CM | POA: Diagnosis not present

## 2015-07-14 DIAGNOSIS — I10 Essential (primary) hypertension: Secondary | ICD-10-CM | POA: Diagnosis not present

## 2015-07-14 DIAGNOSIS — Z0000 Encounter for general adult medical examination without abnormal findings: Secondary | ICD-10-CM

## 2015-07-14 DIAGNOSIS — N939 Abnormal uterine and vaginal bleeding, unspecified: Secondary | ICD-10-CM

## 2015-07-14 DIAGNOSIS — F418 Other specified anxiety disorders: Secondary | ICD-10-CM

## 2015-07-14 MED ORDER — ROSUVASTATIN CALCIUM 20 MG PO TABS: 20.0000 mg | ORAL_TABLET | Freq: Every day | ORAL | Status: DC

## 2015-07-14 NOTE — Patient Instructions (Signed)
-   Start Crestor 20 mg daily for your high cholesterol - Try to cut back down on smoking. I know it will be difficult with the stress you are dealing with - Follow up with your OBGYN within the next 2-4 weeks. Talk to them about Megace and whether you should continue it or not - Consider talking with the hospice grief counselor if you need someone to talk with about what you are going through  General Instructions:   Thank you for bringing your medicines today. This helps [place] keep you safe from mistakes.   Progress Toward Treatment Goals:  Treatment Goal 12/16/2014  Blood pressure at goal  Stop smoking smoking the same amount    Self Care Goals & Plans:  Self Care Goal 12/16/2014  Manage my medications take my medicines as prescribed; bring my medications to every visit; refill my medications on time  Monitor my health -  Eat healthy foods drink diet soda or water instead of juice or soda; eat more vegetables; eat foods that are low in salt; eat baked foods instead of fried foods; eat smaller portions  Be physically active take a walk every day  Stop smoking cut down the number of cigarettes smoked  Meeting treatment goals -    No flowsheet data found.   Care Management & Community Referrals:  Referral 04/09/2014  Referrals made for care management support none needed  Referrals made to community resources none

## 2015-07-14 NOTE — Progress Notes (Signed)
   Subjective:    Patient ID: Rebekah Howard, female    DOB: 03-07-1961, 55 y.o.   MRN: [id number]  HPI Ms. Napoleon is a 55yo woman with PMHx of HTN, OA, hyperlipidemia, and tobacco abuse who presents today for follow up of her hypertension.  HTN: BP today is 120/76. She takes Lisinopril-HCTZ 20-25 mg daily.  Hyperlipidemia: LDL 169 at her last visit in July 2016. She did not want to start a statin at that time. She is now more open to the idea of starting a statin but is concerned about side effects, especially anything that could cause weight gain.   Tobacco Abuse: Last visit she was smoking 2-3 cigarettes daily. Stress related to her mother's death has prevented her from quitting. She is currently smoking 5-7 cigarettes per day.   Abnormal Uterine Bleeding: Reports for the last several months that she has had brown-reddish vaginal discharge that has been intermittent. She reports this has been frustrating because she does not know when it will start and has to wear pads preemptively. She reports when the discharge starts it will typically last for 1 week. She reports her last normal menstrual period was on 04/14/15. She reports associated hot flashes and feeling more emotional but cannot discern if this is related more to her mother's death. She saw her OBGYN in Jan 23, 2023 and was started on Megace. She has not noticed any improvement in her vaginal discharge or hot flashes since starting this medication. She also had an endometrial biopsy on 1/20 that was benign.   Depression/Anxiety: She reports her depression and anxiety have been exacerbated by her mother's death in 05-25-2023. She is very tearful during her visit. She reports she followed up with Dr. Silvio Pate recently and had her Klonopin increased from once daily to three times daily to help get her through the day and for sleep. She reports she has friends and family in the area that have been supportive but her mother's death has been very hard  on her. She is also taking Bupropion 300 mg daily for her depression.    Review of Systems General: Denies fever, chills, night sweats, changes in weight, changes in appetite HEENT: Denies headaches, ear pain, changes in vision, rhinorrhea, sore throat CV: Denies CP, palpitations, SOB, orthopnea Pulm: Denies SOB, cough, wheezing GI: Denies abdominal pain, nausea, vomiting, diarrhea, constipation, melena, hematochezia GU: Denies dysuria, hematuria, frequency Msk: Denies muscle cramps, joint pains Neuro: Denies weakness, numbness, tingling Skin: Denies rashes, bruising Psych: Reports depression, anxiety- chronic. Denies hallucinations    Objective:   Physical Exam General: middle aged obese woman sitting up, tearful at times HEENT: [place]/AT, EOMI, sclera anicteric, mucus membranes moist CV: RRR, no m/g/r Pulm: CTA bilaterally, breaths non-labored Abd: BS+, soft, non-tender Ext: warm, no peripheral edema  Neuro: alert and oriented x 3    Assessment & Plan:  Please refer to A&P documentation.

## 2015-07-15 DIAGNOSIS — N939 Abnormal uterine and vaginal bleeding, unspecified: Secondary | ICD-10-CM | POA: Insufficient documentation

## 2015-07-15 NOTE — Assessment & Plan Note (Signed)
Flu shot today 

## 2015-07-15 NOTE — Assessment & Plan Note (Addendum)
Her AUB is likely related to perimenopause given her age and associated hot flashes. We discussed her vaginal discharge and abnormal menstrual cycles are normal for when her body is transitioning to menopause. I recommended that she follow up with her OBGYN to discuss stopping Megace since she does not believe it is helping. Typically, non-hormonal therapies for menopausal symptoms include SSRIs but the patient is already on an antidepressant. Another option to consider for her could be gabapentin or lyrica.  - Recommended to follow up with OBGYN in next 2-4 weeks

## 2015-07-15 NOTE — Assessment & Plan Note (Signed)
BP well controlled at 120/76 today.  - Continue Lisinopril-HCTZ 20-25 mg daily

## 2015-07-15 NOTE — Assessment & Plan Note (Signed)
Patient wishes to start a statin after we discussed her lipid panel results again from July 2016. She had severe muscle cramps when she took Lipitor so she would like to try a different statin.  - Start Crestor 20 mg daily - Check repeat LDL in 6-8 weeks - Evaluate for intolerance

## 2015-07-15 NOTE — Assessment & Plan Note (Signed)
Follows with Dr. Silvio Pate. Will continue her Klonopin and Bupropion per Dr. Andris Baumann recommendations. I also offered her information on grief counseling through palliative care services here in [place].

## 2015-07-15 NOTE — Assessment & Plan Note (Signed)
She has increased her smoking from 2-3 cigarettes daily to 5-7 cigarettes daily due to increased stressors in her life, especially her mother's recent death. We discussed the harms of tobacco abuse and she understands these risks. She states smoking helps her stress and she wants to quit but relies on smoking to get her through the day. She has agreed to try to cut back down on the number of cigarettes smoker per day. - Continue to encourage smoking cessation

## 2015-07-16 NOTE — Progress Notes (Signed)
Internal Medicine Clinic Attending  Case discussed with Dr. Rivet at the time of the visit.  We reviewed the resident's history and exam and pertinent patient test results.  I agree with the assessment, diagnosis, and plan of care documented in the resident's note.  

## 2015-07-21 ENCOUNTER — Telehealth: Payer: Self-pay | Admitting: *Deleted

## 2015-07-21 DIAGNOSIS — N951 Menopausal and female climacteric states: Secondary | ICD-10-CM

## 2015-07-21 MED ORDER — MEGESTROL ACETATE 40 MG PO TABS: ORAL_TABLET | ORAL | Status: DC

## 2015-07-21 NOTE — Telephone Encounter (Addendum)
Pt left message stating that she has been taking Megace 40 mg but it is not stopping her bleeding. She further states that she is engaged to be married and is embarrassed about the bleeding. Please call back. I returned pt's call and had a lengthy discussion with her regarding her concerns and the normal changes in a woman's body during perimenopause. She states her LMP was 2/6 and she has not stopped bleeding. Her flow is not heavy and is medium to dark red. She is frustrated because she wants to be able to have sex and is not comfortable with that due to the bleeding. I advised pt that I will speak with MD on call and then call her back.    [place] pt after consult w.Dr. Harolyn Rutherford. I advised that the doctor has increased the dosage of her medication in order to get the bleeding to stop. All instructions given and pt voiced understanding.

## 2015-09-08 ENCOUNTER — Other Ambulatory Visit: Payer: Self-pay | Admitting: Obstetrics & Gynecology

## 2015-10-14 ENCOUNTER — Ambulatory Visit: Payer: Medicaid Other | Admitting: Obstetrics & Gynecology

## 2015-10-14 ENCOUNTER — Encounter: Payer: Self-pay | Admitting: Obstetrics & Gynecology

## 2015-10-14 VITALS — BP 111/80 | HR 79 | Wt 266.7 lb

## 2015-10-14 DIAGNOSIS — N939 Abnormal uterine and vaginal bleeding, unspecified: Secondary | ICD-10-CM

## 2015-10-14 LAB — TSH: TSH: 1.35 mIU/L

## 2015-10-14 LAB — POCT URINALYSIS DIP (DEVICE)
Bilirubin Urine: NEGATIVE
Glucose, UA: NEGATIVE mg/dL
Ketones, ur: NEGATIVE mg/dL
Leukocytes, UA: NEGATIVE
Nitrite: NEGATIVE
Protein, ur: NEGATIVE mg/dL
Specific Gravity, Urine: 1.02 (ref 1.005–1.030)
Urobilinogen, UA: 0.2 mg/dL (ref 0.0–1.0)
pH: 5.5 (ref 5.0–8.0)

## 2015-10-14 MED ORDER — MEGESTROL ACETATE 40 MG PO TABS: 80.0000 mg | ORAL_TABLET | Freq: Two times a day (BID) | ORAL | Status: DC

## 2015-10-14 NOTE — Progress Notes (Signed)
Patient ID: Rebekah Howard, female   DOB: [birth year], 55 y.o.   MRN: [id number] History:  55 y.o. G3P3003 here today for reval of AUB. Pt reports that she wants to stop the megace and does not feel that it is helpful she is continuing to bleed daily despite the meds.  If she stops the Megace it comes heavier.  Pt also c/o freq dysuria.  Reports that she has no sx at present.  The following portions of the patient's history were reviewed and updated as appropriate: allergies, current medications, past family history, past medical history, past social history, past surgical history and problem list.  Review of Systems:  Pertinent items are noted in HPI.  Objective:  Physical Exam Blood pressure 111/80, pulse 79, weight 266 lb 11.2 oz (120.974 kg). Gen: NAD Exam deferred  Labs and Imaging No results found.  Assessment & Plan:  AUB- not responsive to Megace Refilled Megace to take until surgery scheduled.  Patient desires surgical management with hysteroscopy with endometrial ablation .  The risks of surgery were discussed in detail with the patient including but not limited to: bleeding which may require transfusion or reoperation; infection which may require prolonged hospitalization or re-hospitalization and antibiotic therapy; injury to bowel, bladder, ureters and major vessels or other surrounding organs; need for additional procedures including laparotomy; thromboembolic phenomenon, incisional problems and other postoperative or anesthesia complications.  Patient was told that the likelihood that her condition and symptoms will be treated effectively with this surgical management was very high; the postoperative expectations were also discussed in detail. The patient also understands the alternative treatment options which were discussed in full. All questions were answered.  She was told that she will be contacted by our surgical scheduler regarding the time and date of her surgery;  routine preoperative instructions of having nothing to eat or drink after midnight on the day prior to surgery and also coming to the hospital 1 1/2 hours prior to her time of surgery were also emphasized.  She was told she may be called for a preoperative appointment about a week prior to surgery and will be given further preoperative instructions at that visit. Printed patient education handouts about the procedure were given to the patient to review at home.  Dysuria- neg UA

## 2015-10-14 NOTE — Patient Instructions (Addendum)
Endometrial Ablation Endometrial ablation removes the lining of the uterus (endometrium). It is usually a same-day, outpatient treatment. Ablation helps avoid major surgery, such as surgery to remove the cervix and uterus (hysterectomy). After endometrial ablation, you will have little or no menstrual bleeding and may not be able to have children. However, if you are premenopausal, you will need to use a reliable method of birth control following the procedure because of the small chance that pregnancy can occur. There are different reasons to have this procedure. These reasons include:  Heavy periods.  Bleeding that is causing anemia.  Irregular bleeding.  Bleeding fibroids on the lining inside the uterus if they are smaller than 3 centimeters. This procedure may not be possible for you if:   You want to have children in the future.   You have severe cramps with your menstrual period.   You have precancerous or cancerous cells in your uterus.   You were recently pregnant.   You have gone through menopause.   You have had major surgery on your uterus, resulting in thinning of the uterine wall. Surgeries may include:  The removal of one or more uterine fibroids (myomectomy).  A cesarean section with a classic (vertical) incision on your uterus. Ask your health care provider what type of cesarean you had. Sometimes the scar on your skin is different than the scar on your uterus. Even if you have had surgery on your uterus, certain types of ablation may still be safe for you. Talk with your health care provider. LET YOUR HEALTH CARE PROVIDER KNOW ABOUT:  Any allergies you have.  All medicines you are taking, including vitamins, herbs, eye drops, creams, and over-the-counter medicines.  Previous problems you or members of your family have had with the use of anesthetics.  Any blood disorders you have.  Previous surgeries you have had.  Medical conditions you have. RISKS AND  COMPLICATIONS  Generally, this is a safe procedure. However, as with any procedure, complications can occur. Possible complications include:  Perforation of the uterus.  Bleeding.  Infection of the uterus, bladder, or vagina.  Injury to surrounding organs.  An air bubble to the lung (air embolus).  Pregnancy following the procedure.  Failure of the procedure to help the problem, requiring hysterectomy.  Decreased ability to diagnose cancer in the lining of the uterus. BEFORE THE PROCEDURE  The lining of the uterus must be tested to make sure there is no pre-cancerous or cancer cells present.  An ultrasound may be performed to look at the size of the uterus and to check for abnormalities.  Medicines may be given to thin the lining of the uterus. PROCEDURE  During the procedure, your health care provider will use a tool called a resectoscope to help see inside your uterus. There are different ways to remove the lining of your uterus.   Radiofrequency - This method uses a radiofrequency-alternating electric current to remove the lining of the uterus.  Cryotherapy - This method uses extreme cold to freeze the lining of the uterus.  Heated-Free Liquid - This method uses heated salt (saline) solution to remove the lining of the uterus.  Microwave - This method uses high-energy microwaves to heat up the lining of the uterus to remove it.  Thermal balloon - This method involves inserting a catheter with a balloon tip into the uterus. The balloon tip is filled with heated fluid to remove the lining of the uterus. AFTER THE PROCEDURE  After your procedure, do   not have sexual intercourse or insert anything into your vagina until permitted by your health care provider. After the procedure, you may experience:  Cramps.  Vaginal discharge.  Frequent urination.   This information is not intended to replace advice given to you by your health care provider. Make sure you discuss any  questions you have with your health care provider.   Document Released: 04/01/2004 Document Revised: 02/11/2015 Document Reviewed: 10/24/2012 Elsevier Interactive Patient Education 2016 Elsevier [employer] Dysfunctional Uterine Bleeding Dysfunctional uterine bleeding is abnormal bleeding from the uterus. Dysfunctional uterine bleeding includes:  A period that comes earlier or later than usual.  A period that is lighter, heavier, or has blood clots.  Bleeding between periods.  Skipping one or more periods.  Bleeding after sexual intercourse.  Bleeding after menopause. HOME CARE INSTRUCTIONS  Pay attention to any changes in your symptoms. Follow these instructions to help with your condition: Eating  Eat well-balanced meals. Include foods that are high in iron, such as liver, meat, shellfish, green leafy vegetables, and eggs.  If you become constipated:  Drink plenty of water.  Eat fruits and vegetables that are high in water and fiber, such as spinach, carrots, raspberries, apples, and mango. Medicines  Take over-the-counter and prescription medicines only as told by your health care provider.  Do not change medicines without talking with your health care provider.  Aspirin or medicines that contain aspirin may make the bleeding worse. Do not take those medicines:  During the week before your period.  During your period.  If you were prescribed iron pills, take them as told by your health care provider. Iron pills help to replace iron that your body loses because of this condition. Activity  If you need to change your sanitary pad or tampon more than one time every 2 hours:  Lie in bed with your feet raised (elevated).  Place a cold pack on your lower abdomen.  Rest as much as possible until the bleeding stops or slows down.  Do not try to lose weight until the bleeding has stopped and your blood iron level is back to normal. Other Instructions  For two months, write  down:  When your period starts.  When your period ends.  When any abnormal bleeding occurs.  What problems you notice.  Keep all follow up visits as told by your health care provider. This is important. SEEK MEDICAL CARE IF:  You get light-headed or weak.  You have nausea and vomiting.  You cannot eat or drink without vomiting.  You feel dizzy or have diarrhea while you are taking medicines.  You are taking birth control pills or hormones, and you want to change them or stop taking them. SEEK IMMEDIATE MEDICAL CARE IF:  You develop a fever or chills.  You need to change your sanitary pad or tampon more than one time per hour.  Your bleeding becomes heavier, or your flow contains clots more often.  You develop pain in your abdomen.  You lose consciousness.  You develop a rash.   This information is not intended to replace advice given to you by your health care provider. Make sure you discuss any questions you have with your health care provider.   Document Released: 05/20/2000 Document Revised: 02/11/2015 Document Reviewed: 08/18/2014 Elsevier Interactive Patient Education [employer].

## 2015-10-15 ENCOUNTER — Encounter (HOSPITAL_COMMUNITY): Payer: Self-pay | Admitting: *Deleted

## 2015-10-15 LAB — FOLLICLE STIMULATING HORMONE: FSH: 15.3 m[IU]/mL

## 2015-10-16 ENCOUNTER — Encounter (HOSPITAL_COMMUNITY): Payer: Self-pay | Admitting: *Deleted

## 2015-10-26 ENCOUNTER — Other Ambulatory Visit (HOSPITAL_COMMUNITY): Payer: Self-pay

## 2015-10-27 ENCOUNTER — Ambulatory Visit (HOSPITAL_COMMUNITY)
Admission: RE | Admit: 2015-10-27 | Discharge: 2015-10-27 | Disposition: A | Discharge status: Home / Self Care | Payer: Medicaid Other | Source: Ambulatory Visit | Attending: Obstetrics & Gynecology | Admitting: Obstetrics & Gynecology

## 2015-10-27 ENCOUNTER — Ambulatory Visit (HOSPITAL_COMMUNITY): Payer: Medicaid Other | Admitting: Anesthesiology

## 2015-10-27 ENCOUNTER — Encounter (HOSPITAL_COMMUNITY)
Admission: RE | Disposition: A | Discharge status: Home / Self Care | Payer: Self-pay | Source: Ambulatory Visit | Attending: Obstetrics & Gynecology

## 2015-10-27 ENCOUNTER — Encounter (HOSPITAL_COMMUNITY): Payer: Self-pay | Admitting: *Deleted

## 2015-10-27 DIAGNOSIS — Z6841 Body Mass Index (BMI) 40.0 and over, adult: Secondary | ICD-10-CM | POA: Insufficient documentation

## 2015-10-27 DIAGNOSIS — N939 Abnormal uterine and vaginal bleeding, unspecified: Secondary | ICD-10-CM | POA: Diagnosis not present

## 2015-10-27 DIAGNOSIS — I10 Essential (primary) hypertension: Secondary | ICD-10-CM | POA: Diagnosis not present

## 2015-10-27 DIAGNOSIS — F418 Other specified anxiety disorders: Secondary | ICD-10-CM | POA: Insufficient documentation

## 2015-10-27 DIAGNOSIS — E785 Hyperlipidemia, unspecified: Secondary | ICD-10-CM | POA: Diagnosis not present

## 2015-10-27 DIAGNOSIS — Z880 Allergy status to penicillin: Secondary | ICD-10-CM | POA: Insufficient documentation

## 2015-10-27 DIAGNOSIS — K219 Gastro-esophageal reflux disease without esophagitis: Secondary | ICD-10-CM | POA: Diagnosis not present

## 2015-10-27 DIAGNOSIS — Z720 Tobacco use: Secondary | ICD-10-CM | POA: Insufficient documentation

## 2015-10-27 DIAGNOSIS — M1990 Unspecified osteoarthritis, unspecified site: Secondary | ICD-10-CM | POA: Insufficient documentation

## 2015-10-27 HISTORY — PX: HYSTEROSCOPY WITH NOVASURE: SHX5574

## 2015-10-27 LAB — BASIC METABOLIC PANEL
ANION GAP: 11 (ref 5–15)
BUN: 15 mg/dL (ref 6–20)
CO2: 23 mmol/L (ref 22–32)
Calcium: 8.8 mg/dL — ABNORMAL LOW (ref 8.9–10.3)
Chloride: 102 mmol/L (ref 101–111)
Creatinine, Ser: 0.87 mg/dL (ref 0.44–1.00)
GLUCOSE: 85 mg/dL (ref 65–99)
POTASSIUM: 3.9 mmol/L (ref 3.5–5.1)
Sodium: 136 mmol/L (ref 135–145)

## 2015-10-27 LAB — CBC
HEMATOCRIT: 42.3 % (ref 36.0–46.0)
Hemoglobin: 14.6 g/dL (ref 12.0–15.0)
MCH: 29.8 pg (ref 26.0–34.0)
MCHC: 34.5 g/dL (ref 30.0–36.0)
MCV: 86.3 fL (ref 78.0–100.0)
PLATELETS: 252 10*3/uL (ref 150–400)
RBC: 4.9 MIL/uL (ref 3.87–5.11)
RDW: 14.4 % (ref 11.5–15.5)
WBC: 5.3 10*3/uL (ref 4.0–10.5)

## 2015-10-27 SURGERY — HYSTEROSCOPY WITH NOVASURE
Anesthesia: General

## 2015-10-27 MED ORDER — SCOPOLAMINE 1 MG/3DAYS TD PT72: 1.0000 | MEDICATED_PATCH | Freq: Once | TRANSDERMAL | Status: DC

## 2015-10-27 MED ORDER — MIDAZOLAM HCL 2 MG/2ML IJ SOLN
INTRAMUSCULAR | Status: AC
  Filled 2015-10-27: qty 2

## 2015-10-27 MED ORDER — PHENYLEPHRINE 40 MCG/ML (10ML) SYRINGE FOR IV PUSH (FOR BLOOD PRESSURE SUPPORT)
PREFILLED_SYRINGE | INTRAVENOUS | Status: AC
  Filled 2015-10-27: qty 10

## 2015-10-27 MED ORDER — OXYCODONE-ACETAMINOPHEN 5-325 MG PO TABS: 1.0000 | ORAL_TABLET | Freq: Four times a day (QID) | ORAL | Status: DC | PRN

## 2015-10-27 MED ORDER — FENTANYL CITRATE (PF) 250 MCG/5ML IJ SOLN
INTRAMUSCULAR | Status: AC
  Filled 2015-10-27: qty 5

## 2015-10-27 MED ORDER — LIDOCAINE HCL (CARDIAC) 20 MG/ML IV SOLN
INTRAVENOUS | Status: AC
  Filled 2015-10-27: qty 5

## 2015-10-27 MED ORDER — BUPIVACAINE HCL (PF) 0.5 % IJ SOLN
INTRAMUSCULAR | Status: AC
  Filled 2015-10-27: qty 30

## 2015-10-27 MED ORDER — KETOROLAC TROMETHAMINE 30 MG/ML IJ SOLN
INTRAMUSCULAR | Status: AC
  Filled 2015-10-27: qty 1

## 2015-10-27 MED ORDER — MIDAZOLAM HCL 5 MG/5ML IJ SOLN
INTRAMUSCULAR | Status: DC | PRN
  Administered 2015-10-27: 2 mg via INTRAVENOUS

## 2015-10-27 MED ORDER — ONDANSETRON HCL 4 MG/2ML IJ SOLN
INTRAMUSCULAR | Status: AC
  Filled 2015-10-27: qty 2

## 2015-10-27 MED ORDER — LACTATED RINGERS IR SOLN
Status: DC | PRN
  Administered 2015-10-27: 3000 mL

## 2015-10-27 MED ORDER — EPHEDRINE 5 MG/ML INJ
INTRAVENOUS | Status: AC
  Filled 2015-10-27: qty 10

## 2015-10-27 MED ORDER — FENTANYL CITRATE (PF) 100 MCG/2ML IJ SOLN
INTRAMUSCULAR | Status: AC
  Filled 2015-10-27: qty 2

## 2015-10-27 MED ORDER — DEXAMETHASONE SODIUM PHOSPHATE 10 MG/ML IJ SOLN
INTRAMUSCULAR | Status: AC
  Filled 2015-10-27: qty 1

## 2015-10-27 MED ORDER — KETOROLAC TROMETHAMINE 30 MG/ML IJ SOLN
INTRAMUSCULAR | Status: DC | PRN
  Administered 2015-10-27: 30 mg via INTRAVENOUS

## 2015-10-27 MED ORDER — PROPOFOL 10 MG/ML IV BOLUS
INTRAVENOUS | Status: AC
  Filled 2015-10-27: qty 20

## 2015-10-27 MED ORDER — PROPOFOL 10 MG/ML IV BOLUS
INTRAVENOUS | Status: DC | PRN
  Administered 2015-10-27: 200 mg via INTRAVENOUS

## 2015-10-27 MED ORDER — LIDOCAINE HCL (CARDIAC) 20 MG/ML IV SOLN
INTRAVENOUS | Status: AC
Start: 2015-10-27 — End: 2015-10-27
  Filled 2015-10-27: qty 5

## 2015-10-27 MED ORDER — LACTATED RINGERS IV SOLN
INTRAVENOUS | Status: DC
  Administered 2015-10-27 (×2): via INTRAVENOUS

## 2015-10-27 MED ORDER — BUPIVACAINE HCL (PF) 0.5 % IJ SOLN
INTRAMUSCULAR | Status: DC | PRN
  Administered 2015-10-27: 10 mL

## 2015-10-27 MED ORDER — FENTANYL CITRATE (PF) 100 MCG/2ML IJ SOLN
INTRAMUSCULAR | Status: DC | PRN
  Administered 2015-10-27 (×2): 50 ug via INTRAVENOUS

## 2015-10-27 MED ORDER — LIDOCAINE HCL (CARDIAC) 20 MG/ML IV SOLN
INTRAVENOUS | Status: DC | PRN
  Administered 2015-10-27: 100 mg via INTRAVENOUS

## 2015-10-27 MED ORDER — ONDANSETRON HCL 4 MG/2ML IJ SOLN
INTRAMUSCULAR | Status: DC | PRN
  Administered 2015-10-27: 4 mg via INTRAVENOUS

## 2015-10-27 MED ORDER — DEXAMETHASONE SODIUM PHOSPHATE 4 MG/ML IJ SOLN
INTRAMUSCULAR | Status: DC | PRN
  Administered 2015-10-27: 10 mg via INTRAVENOUS

## 2015-10-27 SURGICAL SUPPLY — 17 items
ABLATOR ENDOMETRIAL BIPOLAR (ABLATOR) ×3 IMPLANT
CATH ROBINSON RED A/P 16FR (CATHETERS) ×3 IMPLANT
CLOTH BEACON ORANGE TIMEOUT ST (SAFETY) ×3 IMPLANT
CONTAINER PREFILL 10% NBF 60ML (FORM) IMPLANT
GLOVE BIO SURGEON STRL SZ7 (GLOVE) ×3 IMPLANT
GLOVE BIOGEL M 6.5 STRL (GLOVE) ×2 IMPLANT
GLOVE BIOGEL PI IND STRL 6.5 (GLOVE) IMPLANT
GLOVE BIOGEL PI IND STRL 7.0 (GLOVE) ×2 IMPLANT
GLOVE BIOGEL PI INDICATOR 6.5 (GLOVE) ×2
GLOVE BIOGEL PI INDICATOR 7.0 (GLOVE) ×6
GOWN STRL REUS W/TWL LRG LVL3 (GOWN DISPOSABLE) ×6 IMPLANT
GOWN STRL REUS W/TWL XL LVL3 (GOWN DISPOSABLE) ×3 IMPLANT
PACK VAGINAL MINOR WOMEN LF (CUSTOM PROCEDURE TRAY) ×3 IMPLANT
PAD OB MATERNITY 4.3X12.25 (PERSONAL CARE ITEMS) ×3 IMPLANT
TOWEL OR 17X24 6PK STRL BLUE (TOWEL DISPOSABLE) ×6 IMPLANT
TUBING AQUILEX INFLOW (TUBING) ×3 IMPLANT
WATER STERILE IRR 1000ML POUR (IV SOLUTION) ×3 IMPLANT

## 2015-10-27 NOTE — Discharge Instructions (Signed)
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
## 2015-10-27 NOTE — Anesthesia Postprocedure Evaluation (Signed)
Anesthesia Post Note  Patient: Rebekah Howard  Procedure(s) Performed: Procedure(s) (LRB): HYSTEROSCOPY WITH NOVASURE with [place] (N/A)  Patient location during evaluation: PACU Anesthesia Type: General Level of consciousness: sedated Pain management: pain level controlled Vital Signs Assessment: post-procedure vital signs reviewed and stable Respiratory status: spontaneous breathing and respiratory function stable Cardiovascular status: stable Anesthetic complications: no     Last Vitals:  Filed Vitals:   10/27/15 1045 10/27/15 1100  BP: 114/70 105/71  Pulse: 75 66  Temp:    Resp: 17 11    Last Pain:  Filed Vitals:   10/27/15 1103  PainSc: 3    Pain Goal: Patients Stated Pain Goal: 3 (10/27/15 1100)               [place]

## 2015-10-27 NOTE — Transfer of Care (Signed)
Immediate Anesthesia Transfer of Care Note  Patient: Rebekah Howard  Procedure(s) Performed: Procedure(s): HYSTEROSCOPY WITH NOVASURE with [place] (N/A)  Patient Location: PACU  Anesthesia Type:General  Level of Consciousness: awake, alert  and oriented  Airway & Oxygen Therapy: Patient Spontanous Breathing and Patient connected to nasal cannula oxygen  Post-op Assessment: Report given to RN and Post -op Vital signs reviewed and stable  Post vital signs: Reviewed and stable  Last Vitals:  Filed Vitals:   10/27/15 0902  BP: 107/75  Pulse: 63  Temp: 36.5 C  Resp: 20    Last Pain: There were no vitals filed for this visit.    Patients Stated Pain Goal: 3 ([id number])  Complications: No apparent anesthesia complications
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
## 2015-10-27 NOTE — Brief Op Note (Signed)
10/27/2015  10:19 AM  PATIENT:  Rebekah Howard  55 y.o. female  PRE-OPERATIVE DIAGNOSIS:  ABNORMAL UTERINE BLEEDING  POST-OPERATIVE DIAGNOSIS:  ABNORMAL UTERINE BLEEDING  PROCEDURE:  Procedure(s): HYSTEROSCOPY WITH NOVASURE with [place] (N/A)  SURGEON:  Surgeon(s) and Role:    * Lavonia Drafts, MD - Primary  ANESTHESIA:   general; paracervical block   EBL:  Total I/O In: 1000 [I.V.:1000] Out: 130 [Urine:125; Blood:5]  BLOOD ADMINISTERED:none  DRAINS: none   LOCAL MEDICATIONS USED:  MARCAINE     SPECIMEN:  Source of Specimen:  endometrial currettings  DISPOSITION OF SPECIMEN:  PATHOLOGY  COUNTS:  YES  TOURNIQUET:  * No tourniquets in log *  DICTATION: .Note written in EPIC  PLAN OF CARE: Discharge to home after PACU  PATIENT DISPOSITION:  PACU - hemodynamically stable.   Delay start of Pharmacological VTE agent (>24hrs) due to surgical blood loss or risk of bleeding: not applicable  Complications:  None immediate  Eeshan Verbrugge L. Harraway-Smith, M.D., Cherlynn June

## 2015-10-27 NOTE — Anesthesia Preprocedure Evaluation (Addendum)
Anesthesia Evaluation  Patient identified by MRN, date of birth, ID band Patient awake    Reviewed: Allergy & Precautions, H&P , NPO status , Patient's Chart, lab work & pertinent test results  History of Anesthesia Complications (+) PONV and history of anesthetic complications  Airway Mallampati: II  TM Distance: >3 FB Neck ROM: full    Dental no notable dental hx. (+) Dental Advisory Given   Pulmonary Current Smoker,    Pulmonary exam normal breath sounds clear to auscultation       Cardiovascular Exercise Tolerance: Good hypertension, On Medications and Pt. on medications  Rhythm:regular Rate:Normal - Carotid Bruit    Neuro/Psych PSYCHIATRIC DISORDERS Depression negative neurological ROS     GI/Hepatic Neg liver ROS, GERD  ,  Endo/Other  Morbid obesity  Renal/GU negative Renal ROS     Musculoskeletal   Abdominal   Peds  Hematology   Anesthesia Other Findings   Reproductive/Obstetrics                            Anesthesia Physical  Anesthesia Plan  ASA: III  Anesthesia Plan: General   Post-op Pain Management:    Induction: Intravenous  Airway Management Planned: LMA and Oral ETT  Additional Equipment:   Intra-op Plan:   Post-operative Plan: Extubation in OR  Informed Consent: I have reviewed the patients History and Physical, chart, labs and discussed the procedure including the risks, benefits and alternatives for the proposed anesthesia with the patient or authorized representative who has indicated his/her understanding and acceptance.   Dental Advisory Given  Plan Discussed with: CRNA  Anesthesia Plan Comments: ( )        Anesthesia Quick Evaluation

## 2015-10-27 NOTE — Anesthesia Procedure Notes (Signed)
Procedure Name: LMA Insertion Date/Time: 10/27/2015 9:54 AM Performed by: Riki Sheer Pre-anesthesia Checklist: Patient identified, Emergency Drugs available, Suction available, Patient being monitored and Timeout performed Patient Re-evaluated:Patient Re-evaluated prior to inductionOxygen Delivery Method: Circle system utilized Preoxygenation: Pre-oxygenation with 100% oxygen Intubation Type: IV induction Ventilation: Mask ventilation without difficulty LMA: LMA inserted LMA Size: 4.0 Number of attempts: 1 Placement Confirmation: positive ETCO2,  CO2 detector and breath sounds checked- equal and bilateral Tube secured with: Tape Dental Injury: Teeth and Oropharynx as per pre-operative assessment

## 2015-10-28 ENCOUNTER — Telehealth: Payer: Self-pay | Admitting: *Deleted

## 2015-10-28 ENCOUNTER — Encounter (HOSPITAL_COMMUNITY): Payer: Self-pay | Admitting: Obstetrics & Gynecology

## 2015-10-28 NOTE — Telephone Encounter (Signed)
Pt left message stating that she had surgery yesterday and was given Rx for pain medication. She is not able to go to the pharmacy and requests medication to be called in for her. I called her back and left message stating that the medication prescribed cannot be called in. If she has someone else that can take it to the pharmacy for her that would be ok. If this is not possible, Dr. Ihor Dow may be able to call in another medication however it will not be as strong and may not manage her pain as well as the narcotic prescribed. Please call back and let us know.

## 2015-10-29 NOTE — Telephone Encounter (Signed)
Called patient, no answer- left message stating we are trying to reach you to return your phone call, please call us back at the clinics if you still need assistance 

## 2015-11-24 ENCOUNTER — Encounter: Payer: Self-pay | Admitting: Internal Medicine

## 2015-12-03 ENCOUNTER — Other Ambulatory Visit (HOSPITAL_COMMUNITY)
Admission: RE | Admit: 2015-12-03 | Discharge: 2015-12-03 | Disposition: A | Discharge status: Home / Self Care | Payer: Medicaid Other | Source: Ambulatory Visit | Attending: Obstetrics & Gynecology | Admitting: Obstetrics & Gynecology

## 2015-12-03 ENCOUNTER — Ambulatory Visit (INDEPENDENT_AMBULATORY_CARE_PROVIDER_SITE_OTHER): Payer: Medicaid Other | Admitting: Obstetrics & Gynecology

## 2015-12-03 ENCOUNTER — Encounter: Payer: Self-pay | Admitting: Obstetrics & Gynecology

## 2015-12-03 VITALS — BP 123/81 | HR 69 | Wt 257.6 lb

## 2015-12-03 DIAGNOSIS — Z9889 Other specified postprocedural states: Secondary | ICD-10-CM | POA: Diagnosis not present

## 2015-12-03 DIAGNOSIS — Z113 Encounter for screening for infections with a predominantly sexual mode of transmission: Secondary | ICD-10-CM

## 2015-12-03 NOTE — Progress Notes (Signed)
Patient ID: Rebekah Howard, female   DOB: Howard 23, 1962, 55 y.o.   MRN: [id number] History:  55 y.o. G3P3003 here today for 5 week post op check.  Pt reports discharge with odor since the procedure.  She reports that it is light and improved since surgery.  She denies itching. Her bleeding was heavy after the procedure but, now is very light.  She denies pain.   Pt wants STI screen.    The following portions of the patient's history were reviewed and updated as appropriate: allergies, current medications, past family history, past medical history, past social history, past surgical history and problem list.  Review of Systems:  Pertinent items are noted in HPI.  Objective:  Physical Exam Blood pressure 123/81, pulse 69, weight 257 lb 9.6 oz (116.847 kg), last menstrual period 11/25/2015. Gen: NAD Abd: Soft, nontender and nondistended Pelvic: not repeated  Labs and Imaging 10/27/2015 Diagnosis Endometrium, curettage - BENIGN ENDOMETRIOID TYPE POLYP(S). - BENIGN SQUAMOUS MUCOSA. - THERE IS NO EVIDENCE OF HYPERPLASIA OR MALIGNANCY.  Assessment & Plan:  5 week post op check- normal post op sx STI screen  GC/Chl from urine LABs: HIV, RPR, Hep B & C  F/u in 3 months or sooner prn  Bhavin Monjaraz L. Harraway-Smith, M.D., Rebekah Howard

## 2015-12-03 NOTE — Patient Instructions (Signed)
Sexually Transmitted Disease °A sexually transmitted disease (STD) is a disease or infection that may be passed (transmitted) from person to person, usually during sexual activity. This may happen by way of saliva, semen, blood, vaginal mucus, or urine. Common STDs include: °· Gonorrhea. °· Chlamydia. °· Syphilis. °· HIV and AIDS. °· Genital herpes. °· Hepatitis B and C. °· Trichomonas. °· Human papillomavirus (HPV). °· Pubic lice. °· Scabies. °· Mites. °· Bacterial vaginosis. °WHAT ARE CAUSES OF STDs? °An STD may be caused by bacteria, a virus, or parasites. STDs are often transmitted during sexual activity if one person is infected. However, they may also be transmitted through nonsexual means. STDs may be transmitted after:  °· Sexual intercourse with an infected person. °· Sharing sex toys with an infected person. °· Sharing needles with an infected person or using unclean piercing or tattoo needles. °· Having intimate contact with the genitals, mouth, or rectal areas of an infected person. °· Exposure to infected fluids during birth. °WHAT ARE THE SIGNS AND SYMPTOMS OF STDs? °Different STDs have different symptoms. Some people may not have any symptoms. If symptoms are present, they may include: °· Painful or bloody urination. °· Pain in the pelvis, abdomen, vagina, anus, throat, or eyes. °· A skin rash, itching, or irritation. °· Growths, ulcerations, blisters, or sores in the genital and anal areas. °· Abnormal vaginal discharge with or without bad odor. °· Penile discharge in men. °· Fever. °· Pain or bleeding during sexual intercourse. °· Swollen glands in the groin area. °· Yellow skin and eyes (jaundice). This is seen with hepatitis. °· Swollen testicles. °· Infertility. °· Sores and blisters in the mouth. °HOW ARE STDs DIAGNOSED? °To make a diagnosis, your health care provider may: °· Take a medical history. °· Perform a physical exam. °· Take a sample of any discharge to examine. °· Swab the throat,  cervix, opening to the penis, rectum, or vagina for testing. °· Test a sample of your first morning urine. °· Perform blood tests. °· Perform a Pap test, if this applies. °· Perform a colposcopy. °· Perform a laparoscopy. °HOW ARE STDs TREATED? °Treatment depends on the STD. Some STDs may be treated but not cured. °· Chlamydia, gonorrhea, trichomonas, and syphilis can be cured with antibiotic medicine. °· Genital herpes, hepatitis, and HIV can be treated, but not cured, with prescribed medicines. The medicines lessen symptoms. °· Genital warts from HPV can be treated with medicine or by freezing, burning (electrocautery), or surgery. Warts may come back. °· HPV cannot be cured with medicine or surgery. However, abnormal areas may be removed from the cervix, vagina, or vulva. °· If your diagnosis is confirmed, your recent sexual partners need treatment. This is true even if they are symptom-free or have a negative culture or evaluation. They should not have sex until their health care providers say it is okay. °· Your health care provider may test you for infection again 3 months after treatment. °HOW CAN I REDUCE MY RISK OF GETTING AN STD? °Take these steps to reduce your risk of getting an STD: °· Use latex condoms, dental dams, and water-soluble lubricants during sexual activity. Do not use petroleum jelly or oils. °· Avoid having multiple sex partners. °· Do not have sex with someone who has other sex partners °· Do not have sex with anyone you do not know or who is at high risk for an STD. °· Avoid risky sex practices that can break your skin. °· Do not have sex   if you have open sores on your mouth or skin. °· Avoid drinking too much alcohol or taking illegal drugs. Alcohol and drugs can affect your judgment and put you in a vulnerable position. °· Avoid engaging in oral and anal sex acts. °· Get vaccinated for HPV and hepatitis. If you have not received these vaccines in the past, talk to your health care  provider about whether one or both might be right for you. °· If you are at risk of being infected with HIV, it is recommended that you take a prescription medicine daily to prevent HIV infection. This is called pre-exposure prophylaxis (PrEP). You are considered at risk if: °¨ You are a man who has sex with other men (MSM). °¨ You are a heterosexual man or woman and are sexually active with more than one partner. °¨ You take drugs by injection. °¨ You are sexually active with a partner who has HIV. °· Talk with your health care provider about whether you are at high risk of being infected with HIV. If you choose to begin PrEP, you should first be tested for HIV. You should then be tested every 3 months for as long as you are taking PrEP. °WHAT SHOULD I DO IF I THINK I HAVE AN STD? °· See your health care provider. °· Tell your sexual partner(s). They should be tested and treated for any STDs. °· Do not have sex until your health care provider says it is okay. °WHEN SHOULD I GET IMMEDIATE MEDICAL CARE? °Contact your health care provider right away if:  °· You have severe abdominal pain. °· You are a man and notice swelling or pain in your testicles. °· You are a woman and notice swelling or pain in your vagina. °  °This information is not intended to replace advice given to you by your health care provider. Make sure you discuss any questions you have with your health care provider. °  °Document Released: 08/13/2002 Document Revised: 06/13/2014 Document Reviewed: 12/11/2012 °Elsevier Interactive Patient Education ©2016 [employer] ° °

## 2015-12-04 LAB — RPR

## 2015-12-04 LAB — HEPATITIS B SURFACE ANTIGEN: Hepatitis B Surface Ag: NEGATIVE

## 2015-12-04 LAB — HIV ANTIBODY (ROUTINE TESTING W REFLEX): HIV 1&2 Ab, 4th Generation: NONREACTIVE

## 2015-12-04 LAB — GC/CHLAMYDIA PROBE AMP (~~LOC~~) NOT AT ARMC
CHLAMYDIA, DNA PROBE: NEGATIVE
Neisseria Gonorrhea: NEGATIVE

## 2015-12-25 ENCOUNTER — Other Ambulatory Visit: Payer: Self-pay | Admitting: Internal Medicine

## 2015-12-25 NOTE — Telephone Encounter (Signed)
Missed last appointment

## 2015-12-26 NOTE — Telephone Encounter (Signed)
Needs to make an appointment.

## 2015-12-29 NOTE — Telephone Encounter (Signed)
Please schedule for an appointment to continue to receive refills. Thanks!

## 2016-01-06 ENCOUNTER — Encounter: Payer: Self-pay | Admitting: Internal Medicine

## 2016-02-02 ENCOUNTER — Encounter: Payer: Self-pay | Admitting: Internal Medicine

## 2016-02-25 ENCOUNTER — Ambulatory Visit (INDEPENDENT_AMBULATORY_CARE_PROVIDER_SITE_OTHER): Payer: Medicaid Other | Admitting: *Deleted

## 2016-02-25 DIAGNOSIS — Z23 Encounter for immunization: Secondary | ICD-10-CM | POA: Diagnosis not present

## 2016-03-03 ENCOUNTER — Other Ambulatory Visit: Payer: Self-pay | Admitting: Internal Medicine

## 2016-04-18 ENCOUNTER — Telehealth: Payer: Self-pay | Admitting: Internal Medicine

## 2016-04-18 NOTE — Telephone Encounter (Signed)
APT. REMINDER CALL, LMTCB °

## 2016-04-19 ENCOUNTER — Ambulatory Visit (INDEPENDENT_AMBULATORY_CARE_PROVIDER_SITE_OTHER): Payer: Medicaid Other | Admitting: Internal Medicine

## 2016-04-19 ENCOUNTER — Other Ambulatory Visit (HOSPITAL_COMMUNITY)
Admission: RE | Admit: 2016-04-19 | Discharge: 2016-04-19 | Disposition: A | Discharge status: Home / Self Care | Payer: Medicaid Other | Source: Ambulatory Visit | Attending: Internal Medicine | Admitting: Internal Medicine

## 2016-04-19 ENCOUNTER — Encounter: Payer: Self-pay | Admitting: Internal Medicine

## 2016-04-19 ENCOUNTER — Other Ambulatory Visit: Payer: Self-pay | Admitting: Internal Medicine

## 2016-04-19 VITALS — BP 118/78 | HR 64 | Temp 97.6°F | Wt 262.8 lb

## 2016-04-19 DIAGNOSIS — E785 Hyperlipidemia, unspecified: Secondary | ICD-10-CM | POA: Diagnosis not present

## 2016-04-19 DIAGNOSIS — F329 Major depressive disorder, single episode, unspecified: Secondary | ICD-10-CM

## 2016-04-19 DIAGNOSIS — Z720 Tobacco use: Secondary | ICD-10-CM

## 2016-04-19 DIAGNOSIS — F418 Other specified anxiety disorders: Secondary | ICD-10-CM

## 2016-04-19 DIAGNOSIS — Z7251 High risk heterosexual behavior: Secondary | ICD-10-CM | POA: Diagnosis not present

## 2016-04-19 DIAGNOSIS — N939 Abnormal uterine and vaginal bleeding, unspecified: Secondary | ICD-10-CM

## 2016-04-19 DIAGNOSIS — Z113 Encounter for screening for infections with a predominantly sexual mode of transmission: Secondary | ICD-10-CM | POA: Insufficient documentation

## 2016-04-19 DIAGNOSIS — Z8742 Personal history of other diseases of the female genital tract: Secondary | ICD-10-CM | POA: Diagnosis not present

## 2016-04-19 DIAGNOSIS — Z79899 Other long term (current) drug therapy: Secondary | ICD-10-CM

## 2016-04-19 DIAGNOSIS — I10 Essential (primary) hypertension: Secondary | ICD-10-CM | POA: Diagnosis present

## 2016-04-19 DIAGNOSIS — F17210 Nicotine dependence, cigarettes, uncomplicated: Secondary | ICD-10-CM | POA: Diagnosis not present
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
## 2016-04-19 NOTE — Assessment & Plan Note (Addendum)
Patient is monogamous in her relationship, but she has questions whether her partner is doing the same. She is concerned about the possibility of an STI. She describes white vaginal discharge without odor or itchiness, which likely represents normal vaginal discharge. I will check HIV, RPR, GC/Chlamydia. She was given condoms. Recommended to have discussion with partner and see if he would be willing to get tested as well. Patient wanted pap smear but she had a normal one with HPV co-testing which was negative in 2016. Next pap smear not due until 2021.

## 2016-04-19 NOTE — Patient Instructions (Signed)
General Instructions: - Please let me know at your next visit which medications you are taking for your depression - Please consider trying a cholesterol medicine. Your risk for having a cardiac event are 10% over 10 years - Discuss having your partner get tested - Please strongly consider quitting smoking.   Thank you for bringing your medicines today. This helps [place] keep you safe from mistakes.   Progress Toward Treatment Goals:  Treatment Goal 12/16/2014  Blood pressure at goal  Stop smoking smoking the same amount    Self Care Goals & Plans:  Self Care Goal 12/16/2014  Manage my medications take my medicines as prescribed; bring my medications to every visit; refill my medications on time  Monitor my health -  Eat healthy foods drink diet soda or water instead of juice or soda; eat more vegetables; eat foods that are low in salt; eat baked foods instead of fried foods; eat smaller portions  Be physically active take a walk every day  Stop smoking cut down the number of cigarettes smoked  Meeting treatment goals -    No flowsheet data found.   Care Management & Community Referrals:  Referral 04/09/2014  Referrals made for care management support none needed  Referrals made to community resources none

## 2016-04-19 NOTE — Assessment & Plan Note (Signed)
Well controlled. - Continue Lisinopril-HCTZ 20-25 mg daily 

## 2016-04-19 NOTE — Assessment & Plan Note (Signed)
Well controlled. Currently being managed by outpatient psychiatry. She is currently on Bupropion and Klonopin, both of which are being titrated down. She is going to start a different medication, but does not know which one. I see both Pristiq and Buspar on her medication list so likely both of those. I have asked her to let me know which medication(s) she is being switched to at her next visit.

## 2016-04-19 NOTE — Assessment & Plan Note (Signed)
Patient's ASCVD risk score is 10.2%. Her blood pressure is controlled, but she continues to smoke. I discussed starting a different statin with her and the risk of an adverse cardiac event but the patient is not interested in trying a different statin due to costs and concern for side effects. Advised her to quit smoking as this is really the only modifiable risk factor.

## 2016-04-19 NOTE — Assessment & Plan Note (Signed)
Continues to smoke 2-3 cigarettes daily. Has no desire to quit. We discussed the adverse effects of smoking and how this can impact her health. Will continue to encourage smoking cessation.

## 2016-04-19 NOTE — Assessment & Plan Note (Signed)
No longer having issues with AUB since her D&C procedure in May. She is no longer menstruating. She will follow up with OBGYN as needed.

## 2016-04-20 LAB — HIV ANTIBODY (ROUTINE TESTING W REFLEX): HIV Screen 4th Generation wRfx: NONREACTIVE

## 2016-04-20 LAB — RPR: RPR Ser Ql: NONREACTIVE

## 2016-04-21 LAB — URINE CYTOLOGY ANCILLARY ONLY
CHLAMYDIA, DNA PROBE: NEGATIVE
NEISSERIA GONORRHEA: NEGATIVE

## 2016-04-21 NOTE — Progress Notes (Signed)
Case discussed with Dr. Rivet at the time of the visit.  We reviewed the resident's history and exam and pertinent patient test results.  I agree with the assessment, diagnosis, and plan of care documented in the resident's note. 

## 2016-05-03 ENCOUNTER — Other Ambulatory Visit: Payer: Self-pay | Admitting: Internal Medicine

## 2016-05-23 ENCOUNTER — Emergency Department (HOSPITAL_COMMUNITY)
Admission: EM | Admit: 2016-05-23 | Discharge: 2016-05-23 | Disposition: A | Discharge status: Home / Self Care | Payer: Medicaid Other | Attending: Emergency Medicine | Admitting: Emergency Medicine

## 2016-05-23 ENCOUNTER — Encounter (HOSPITAL_COMMUNITY): Payer: Self-pay

## 2016-05-23 DIAGNOSIS — Z91040 Latex allergy status: Secondary | ICD-10-CM | POA: Insufficient documentation

## 2016-05-23 DIAGNOSIS — I10 Essential (primary) hypertension: Secondary | ICD-10-CM | POA: Diagnosis not present

## 2016-05-23 DIAGNOSIS — F17210 Nicotine dependence, cigarettes, uncomplicated: Secondary | ICD-10-CM | POA: Diagnosis not present

## 2016-05-23 DIAGNOSIS — J029 Acute pharyngitis, unspecified: Secondary | ICD-10-CM | POA: Diagnosis not present

## 2016-05-23 DIAGNOSIS — Z79899 Other long term (current) drug therapy: Secondary | ICD-10-CM | POA: Diagnosis not present

## 2016-05-23 LAB — RAPID STREP SCREEN (MED CTR MEBANE ONLY): STREPTOCOCCUS, GROUP A SCREEN (DIRECT): NEGATIVE

## 2016-05-23 MED ORDER — CEPHALEXIN 500 MG PO CAPS: 500.0000 mg | ORAL_CAPSULE | Freq: Four times a day (QID) | ORAL | 0 refills | Status: DC

## 2016-05-23 NOTE — ED Notes (Signed)
Pt is in stable condition upon d/c and ambulates from ED. Pt is requesting PA to call in medication to her pharmacy that delivers to her house.
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
## 2016-05-23 NOTE — Discharge Instructions (Signed)
Please take your antibiotic with food. You are infectious for 24 hours after taking your first dose. Drink plenty of fluids. Cold fluids work best. Take Tylenol or Ibuprofen for pain/fever as needed

## 2016-05-23 NOTE — ED Triage Notes (Signed)
Pt here for sore throat and possible strep. Throat appears red with some white spots.

## 2016-05-25 ENCOUNTER — Other Ambulatory Visit: Payer: Self-pay | Admitting: Internal Medicine

## 2016-05-25 DIAGNOSIS — Z1231 Encounter for screening mammogram for malignant neoplasm of breast: Secondary | ICD-10-CM

## 2016-05-26 LAB — CULTURE, GROUP A STREP (THRC)

## 2016-06-16 ENCOUNTER — Ambulatory Visit
Admission: RE | Admit: 2016-06-16 | Discharge: 2016-06-16 | Disposition: A | Discharge status: Home / Self Care | Payer: Medicaid Other | Source: Ambulatory Visit | Attending: Internal Medicine | Admitting: Internal Medicine

## 2016-06-16 ENCOUNTER — Telehealth: Payer: Self-pay | Admitting: *Deleted

## 2016-06-16 DIAGNOSIS — Z1231 Encounter for screening mammogram for malignant neoplasm of breast: Secondary | ICD-10-CM

## 2016-06-16 NOTE — Telephone Encounter (Signed)
error 

## 2016-07-19 ENCOUNTER — Encounter (INDEPENDENT_AMBULATORY_CARE_PROVIDER_SITE_OTHER): Payer: Self-pay

## 2016-07-19 ENCOUNTER — Ambulatory Visit (INDEPENDENT_AMBULATORY_CARE_PROVIDER_SITE_OTHER): Payer: Medicaid Other | Admitting: Pulmonary Disease

## 2016-07-19 ENCOUNTER — Encounter: Payer: Self-pay | Admitting: Pulmonary Disease

## 2016-07-19 VITALS — BP 128/81 | HR 59 | Temp 97.5°F | Ht 66.0 in | Wt 278.0 lb

## 2016-07-19 DIAGNOSIS — F17210 Nicotine dependence, cigarettes, uncomplicated: Secondary | ICD-10-CM | POA: Diagnosis not present

## 2016-07-19 DIAGNOSIS — M79672 Pain in left foot: Secondary | ICD-10-CM

## 2016-07-19 DIAGNOSIS — L989 Disorder of the skin and subcutaneous tissue, unspecified: Secondary | ICD-10-CM

## 2016-07-19 DIAGNOSIS — I10 Essential (primary) hypertension: Secondary | ICD-10-CM | POA: Diagnosis not present

## 2016-07-19 DIAGNOSIS — Z79899 Other long term (current) drug therapy: Secondary | ICD-10-CM

## 2016-07-19 DIAGNOSIS — L84 Corns and callosities: Secondary | ICD-10-CM | POA: Insufficient documentation

## 2016-07-19 NOTE — Assessment & Plan Note (Signed)
Assessment: BP controlled today at 128/81  Plan:  Continue lisinopril-hydrochlorothiazide 20-25mg  daily

## 2016-07-19 NOTE — Assessment & Plan Note (Signed)
Assessment: Lesion on lateral aspect of left plantar surface of foot probably a callus due to footwear. Low suspicion for retained foreign body. She would like to see a podiatrist.  Plan: Referral placed for podiatry
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
## 2016-07-19 NOTE — Patient Instructions (Signed)
We will refer you to podiatry

## 2016-07-22 NOTE — Progress Notes (Signed)
Internal Medicine Clinic Attending  Case discussed with Dr. Krall soon after the resident saw the patient.  We reviewed the resident's history and exam and pertinent patient test results.  I agree with the assessment, diagnosis, and plan of care documented in the resident's note. 

## 2016-07-29 DIAGNOSIS — D492 Neoplasm of unspecified behavior of bone, soft tissue, and skin: Secondary | ICD-10-CM | POA: Diagnosis not present

## 2016-07-29 DIAGNOSIS — M7989 Other specified soft tissue disorders: Secondary | ICD-10-CM | POA: Diagnosis not present

## 2016-11-15 ENCOUNTER — Encounter: Payer: Self-pay | Admitting: *Deleted

## 2017-01-23 ENCOUNTER — Ambulatory Visit (INDEPENDENT_AMBULATORY_CARE_PROVIDER_SITE_OTHER): Payer: Medicaid Other | Admitting: Internal Medicine

## 2017-01-23 ENCOUNTER — Encounter: Payer: Self-pay | Admitting: Internal Medicine

## 2017-01-23 VITALS — BP 128/78 | HR 51 | Temp 97.9°F | Ht 66.0 in | Wt 318.4 lb

## 2017-01-23 DIAGNOSIS — E785 Hyperlipidemia, unspecified: Secondary | ICD-10-CM | POA: Diagnosis not present

## 2017-01-23 DIAGNOSIS — I10 Essential (primary) hypertension: Secondary | ICD-10-CM | POA: Diagnosis present

## 2017-01-23 DIAGNOSIS — Z6841 Body Mass Index (BMI) 40.0 and over, adult: Secondary | ICD-10-CM | POA: Diagnosis not present

## 2017-01-23 DIAGNOSIS — Z720 Tobacco use: Secondary | ICD-10-CM

## 2017-01-23 DIAGNOSIS — F418 Other specified anxiety disorders: Secondary | ICD-10-CM

## 2017-01-23 DIAGNOSIS — F17210 Nicotine dependence, cigarettes, uncomplicated: Secondary | ICD-10-CM | POA: Diagnosis not present

## 2017-01-23 DIAGNOSIS — Z79899 Other long term (current) drug therapy: Secondary | ICD-10-CM | POA: Diagnosis not present

## 2017-01-23 LAB — POCT GLYCOSYLATED HEMOGLOBIN (HGB A1C): HEMOGLOBIN A1C: 5.6

## 2017-01-23 LAB — GLUCOSE, CAPILLARY: GLUCOSE-CAPILLARY: 73 mg/dL (ref 65–99)

## 2017-01-23 NOTE — Assessment & Plan Note (Addendum)
BP at goal 120/78. Currently on lisinopril-hydrochlorothiazide 20/25 milligrams. Reports compliance. Denies headache, blurred vision, chest pain, shortness of breath, and leg swelling.  -BMET to check renal function - Will continue current regimen for blood pressure

## 2017-01-23 NOTE — Patient Instructions (Addendum)
It was nice to meet you today, Rebekah Howard.  I have provided you with information about canceling services and smoking cessation. Please let me give any more information about this.  Your blood pressure excellent today. Continue taking your lisinopril-hydrochlorothiazide as prescribed.  Walking 30 minutes a day 3 days a week can help you with weight loss.  I will call you if the results of your blood tests are abnormal  Please follow-up with me (Dr. Frederico Hamman) in 3 months.
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
## 2017-01-23 NOTE — Assessment & Plan Note (Addendum)
Patient states previously on Lipitor but it made her feel weak and funny. She wishes to have her cholesterol rechecked today but does not wish to start medication at this time. Her ASCVD is 13.4%. Explained to patient what this means in terms of risk of cardiovascular events such as MI and stroke in the next 10 years, but she declined starting medication once again. States she she would like to start with lifestyle modifications such as weight loss and reducing fat intake in her diet.  - Lipid panel ordered. Will call patient if results are abnormal  8/20: cholesterol 215, TG 186, LDL 129, and HDL 49 8/21: Spoke to patient about lipid panel results. Recommended starting medication once again, but she continues to decline and would like to to try lifestyle modifications first. Patient advised to call clinic if she decides to start medication later on.

## 2017-01-23 NOTE — Assessment & Plan Note (Signed)
Patient smokes one pack of cigarettes every 3-4 days. She states she might be ready to quit soon, but is not ready today. She tried Chantix in 2008 and reports suicidal ideation while on this medication and would now like to try it again. She is also concerned about weight gain with smoking cessation. Explained to patient benefits of smoking cessation and provided educational material. I also explained that quitting smoking will decrease her risk of a cardiovascular event in the future.  - Counseled on smoking cessation - Provided material for smoking cessation and information about the quit line - Follow-up in 3 months

## 2017-01-23 NOTE — Assessment & Plan Note (Signed)
Patient has had a 40 lbs weight gain since February 2018. Currently 318 lbs. Denies changes in diet or appetite. Currently on Latuda for uncontrolled depression. Suspect weight gain is likely multifactorial in the setting of uncontrolled depression and Latuda use.  - Educated patient on benefits of exercise. Recommended 30 minutes of walking 3 times a week. - Check A1c and lipid panel and will call patient if results are abnormal. - Follow-up in 3 months  A1c 5.6.

## 2017-01-23 NOTE — Assessment & Plan Note (Signed)
Patient follows up with outpatient psychiatry for this. She was recently started on topiramate 25 mg twice a day. Currently on Latuda 40 mg daily as well. States she has not noticed a difference in her mood since starting topiramate. She also endorses poor concentration and lack of motivation. Has also stopped exercising because she does not want to leave her house. Reports recent problems in personal relationships and other stresses. Denies changes in diet, but has increased 40 lbs since her last visit in February 2018. This could be due to the [place].  - Check A1c and lipid profile given the patient is on Latuda - Recommended follow with outpatient psychiatrist for her uncontrolled depression - Provided information about [facility] for counseling - Educated about the benefits of exercising

## 2017-01-24 LAB — LIPID PANEL
CHOL/HDL RATIO: 4.4 ratio (ref 0.0–4.4)
Cholesterol, Total: 215 mg/dL — ABNORMAL HIGH (ref 100–199)
HDL: 49 mg/dL (ref >39)
LDL Calculated: 129 mg/dL — ABNORMAL HIGH (ref 0–99)
TRIGLYCERIDES: 186 mg/dL — AB (ref 0–149)
VLDL CHOLESTEROL CAL: 37 mg/dL (ref 5–40)

## 2017-01-24 LAB — BMP8+ANION GAP
ANION GAP: 16 mmol/L (ref 10.0–18.0)
BUN/Creatinine Ratio: 14 (ref 9–23)
BUN: 13 mg/dL (ref 6–24)
CALCIUM: 9.2 mg/dL (ref 8.7–10.2)
CO2: 21 mmol/L (ref 20–29)
CREATININE: 0.94 mg/dL (ref 0.57–1.00)
Chloride: 104 mmol/L (ref 96–106)
GFR, EST AFRICAN AMERICAN: 79 mL/min/{1.73_m2} (ref >59)
GFR, EST NON AFRICAN AMERICAN: 69 mL/min/{1.73_m2} (ref >59)
Glucose: 72 mg/dL (ref 65–99)
POTASSIUM: 3.7 mmol/L (ref 3.5–5.2)
Sodium: 141 mmol/L (ref 134–144)

## 2017-01-26 NOTE — Progress Notes (Signed)
Internal Medicine Clinic Attending  I saw and evaluated the patient.  I personally confirmed the key portions of the history and exam documented by Dr. Santos-Sanchez and I reviewed pertinent patient test results.  The assessment, diagnosis, and plan were formulated together and I agree with the documentation in the resident's note. 

## 2017-03-07 ENCOUNTER — Other Ambulatory Visit: Payer: Self-pay

## 2017-03-07 NOTE — Telephone Encounter (Signed)
lisinopril-hydrochlorothiazide (PRINZIDE,ZESTORETIC) 20-25 MG tablet, [facility]

## 2017-03-09 MED ORDER — LISINOPRIL-HYDROCHLOROTHIAZIDE 20-25 MG PO TABS: 1.0000 | ORAL_TABLET | Freq: Every day | ORAL | 6 refills | Status: DC

## 2017-03-31 ENCOUNTER — Ambulatory Visit (INDEPENDENT_AMBULATORY_CARE_PROVIDER_SITE_OTHER): Payer: Medicaid Other | Admitting: Internal Medicine

## 2017-03-31 ENCOUNTER — Encounter (INDEPENDENT_AMBULATORY_CARE_PROVIDER_SITE_OTHER): Payer: Self-pay

## 2017-03-31 ENCOUNTER — Encounter: Payer: Self-pay | Admitting: Internal Medicine

## 2017-03-31 VITALS — BP 120/75 | HR 56 | Temp 97.8°F | Wt 323.2 lb

## 2017-03-31 DIAGNOSIS — E785 Hyperlipidemia, unspecified: Secondary | ICD-10-CM | POA: Diagnosis present

## 2017-03-31 DIAGNOSIS — I10 Essential (primary) hypertension: Secondary | ICD-10-CM | POA: Diagnosis not present

## 2017-03-31 DIAGNOSIS — F17210 Nicotine dependence, cigarettes, uncomplicated: Secondary | ICD-10-CM

## 2017-03-31 DIAGNOSIS — Z6841 Body Mass Index (BMI) 40.0 and over, adult: Secondary | ICD-10-CM

## 2017-03-31 DIAGNOSIS — F339 Major depressive disorder, recurrent, unspecified: Secondary | ICD-10-CM | POA: Diagnosis not present

## 2017-03-31 DIAGNOSIS — Z79899 Other long term (current) drug therapy: Secondary | ICD-10-CM | POA: Diagnosis not present

## 2017-03-31 DIAGNOSIS — Z23 Encounter for immunization: Secondary | ICD-10-CM

## 2017-03-31 DIAGNOSIS — Z720 Tobacco use: Secondary | ICD-10-CM

## 2017-03-31 DIAGNOSIS — R635 Abnormal weight gain: Secondary | ICD-10-CM | POA: Diagnosis not present

## 2017-03-31 MED ORDER — ATORVASTATIN CALCIUM 40 MG PO TABS: 40.0000 mg | ORAL_TABLET | Freq: Every day | ORAL | 3 refills | Status: DC

## 2017-03-31 NOTE — Assessment & Plan Note (Addendum)
Discussed with patient benefits of starting statin therapy once again. Patient agreeable to start therapy today. Last lipid panel 01/2017 showed cholesterol 215, TG 186, LDL 129, and HDL 49. She is also on Latuda for depression which is likely contributing to HLD. ASCVD 9.2%. Based on ASCVD, patient will benefit form starting moderate-high intensity statin.  - Start atorvastatin 40 mg QD

## 2017-03-31 NOTE — Assessment & Plan Note (Addendum)
BP at goal, 120/75. On lisinopril-HCTZ 20-25 mg QD and reports compliance. Denies HA, changes in vision, chest pain, shortness of breath, and lower extremity swelling.   HTN: chronic, stable, and well controlled  - Will continue lisinopril-HCTZ 20-25 mg QD

## 2017-03-31 NOTE — Patient Instructions (Addendum)
It was nice to see you again, Rebekah Howard.   Please start taking lipitor (atorvastatin) 40 mg, take 1 tablet every day.   I will see you back in 3 months for weight follow up. Your goal is 5 lbs weight loss in 3 months. Try to avoid sweets, candy, sodas, cake, doughnuts, etc. Other foods high in carbohydrate (sugar) include rice, potato, beans, and pasta. Start walking 30 minutes 3 days a week and increase the amount of time or the amount of days as you tolerate it.    Calorie Counting for Weight Loss Calories are units of energy. Your body needs a certain amount of calories from food to keep you going throughout the day. When you eat more calories than your body needs, your body stores the extra calories as fat. When you eat fewer calories than your body needs, your body burns fat to get the energy it needs. Calorie counting means keeping track of how many calories you eat and drink each day. Calorie counting can be helpful if you need to lose weight. If you make sure to eat fewer calories than your body needs, you should lose weight. Ask your health care provider what a healthy weight is for you. For calorie counting to work, you will need to eat the right number of calories in a day in order to lose a healthy amount of weight per week. A dietitian can help you determine how many calories you need in a day and will give you suggestions on how to reach your calorie goal.  A healthy amount of weight to lose per week is usually 1-2 lb (0.5-0.9 kg). This usually means that your daily calorie intake should be reduced by 500-750 calories.  Eating 1,200 - 1,500 calories per day can help most women lose weight.  Eating 1,500 - 1,800 calories per day can help most men lose weight.  What is my plan? My goal is to have __________ calories per day. If I have this many calories per day, I should lose around __________ pounds per week. What do I need to know about calorie counting? In order to meet your  daily calorie goal, you will need to:  Find out how many calories are in each food you would like to eat. Try to do this before you eat.  Decide how much of the food you plan to eat.  Write down what you ate and how many calories it had. Doing this is called keeping a food log.  To successfully lose weight, it is important to balance calorie counting with a healthy lifestyle that includes regular activity. Aim for 150 minutes of moderate exercise (such as walking) or 75 minutes of vigorous exercise (such as running) each week. Where do I find calorie information?  The number of calories in a food can be found on a Nutrition Facts label. If a food does not have a Nutrition Facts label, try to look up the calories online or ask your dietitian for help. Remember that calories are listed per serving. If you choose to have more than one serving of a food, you will have to multiply the calories per serving by the amount of servings you plan to eat. For example, the label on a package of bread might say that a serving size is 1 slice and that there are 90 calories in a serving. If you eat 1 slice, you will have eaten 90 calories. If you eat 2 slices, you will have eaten 180  calories. How do I keep a food log? Immediately after each meal, record the following information in your food log:  What you ate. Don't forget to include toppings, sauces, and other extras on the food.  How much you ate. This can be measured in cups, ounces, or number of items.  How many calories each food and drink had.  The total number of calories in the meal.  Keep your food log near you, such as in a small notebook in your pocket, or use a mobile app or website. Some programs will calculate calories for you and show you how many calories you have left for the day to meet your goal. What are some calorie counting tips?  Use your calories on foods and drinks that will fill you up and not leave you hungry: ? Some examples  of foods that fill you up are nuts and nut butters, vegetables, lean proteins, and high-fiber foods like whole grains. High-fiber foods are foods with more than 5 g fiber per serving. ? Drinks such as sodas, specialty coffee drinks, alcohol, and juices have a lot of calories, yet do not fill you up.  Eat nutritious foods and avoid empty calories. Empty calories are calories you get from foods or beverages that do not have many vitamins or protein, such as candy, sweets, and soda. It is better to have a nutritious high-calorie food (such as an avocado) than a food with few nutrients (such as a bag of chips).  Know how many calories are in the foods you eat most often. This will help you calculate calorie counts faster.  Pay attention to calories in drinks. Low-calorie drinks include water and unsweetened drinks.  Pay attention to nutrition labels for "low fat" or "fat free" foods. These foods sometimes have the same amount of calories or more calories than the full fat versions. They also often have added sugar, starch, or salt, to make up for flavor that was removed with the fat.  Find a way of tracking calories that works for you. Get creative. Try different apps or programs if writing down calories does not work for you. What are some portion control tips?  Know how many calories are in a serving. This will help you know how many servings of a certain food you can have.  Use a measuring cup to measure serving sizes. You could also try weighing out portions on a kitchen scale. With time, you will be able to estimate serving sizes for some foods.  Take some time to put servings of different foods on your favorite plates, bowls, and cups so you know what a serving looks like.  Try not to eat straight from a bag or box. Doing this can lead to overeating. Put the amount you would like to eat in a cup or on a plate to make sure you are eating the right portion.  Use smaller plates, glasses, and  bowls to prevent overeating.  Try not to multitask (for example, watch TV or use your computer) while eating. If it is time to eat, sit down at a table and enjoy your food. This will help you to know when you are full. It will also help you to be aware of what you are eating and how much you are eating. What are tips for following this plan? Reading food labels  Check the calorie count compared to the serving size. The serving size may be smaller than what you are used to eating.  Check the source of the calories. Make sure the food you are eating is high in vitamins and protein and low in saturated and trans fats. Shopping  Read nutrition labels while you shop. This will help you make healthy decisions before you decide to purchase your food.  Make a grocery list and stick to it. Cooking  Try to cook your favorite foods in a healthier way. For example, try baking instead of frying.  Use low-fat dairy products. Meal planning  Use more fruits and vegetables. Half of your plate should be fruits and vegetables.  Include lean proteins like poultry and fish. How do I count calories when eating out?  Ask for smaller portion sizes.  Consider sharing an entree and sides instead of getting your own entree.  If you get your own entree, eat only half. Ask for a box at the beginning of your meal and put the rest of your entree in it so you are not tempted to eat it.  If calories are listed on the menu, choose the lower calorie options.  Choose dishes that include vegetables, fruits, whole grains, low-fat dairy products, and lean protein.  Choose items that are boiled, broiled, grilled, or steamed. Stay away from items that are buttered, battered, fried, or served with cream sauce. Items labeled "crispy" are usually fried, unless stated otherwise.  Choose water, low-fat milk, unsweetened iced tea, or other drinks without added sugar. If you want an alcoholic beverage, choose a lower calorie  option such as a glass of wine or light beer.  Ask for dressings, sauces, and syrups on the side. These are usually high in calories, so you should limit the amount you eat.  If you want a salad, choose a garden salad and ask for grilled meats. Avoid extra toppings like bacon, cheese, or fried items. Ask for the dressing on the side, or ask for olive oil and vinegar or lemon to use as dressing.  Estimate how many servings of a food you are given. For example, a serving of cooked rice is  cup or about the size of half a baseball. Knowing serving sizes will help you be aware of how much food you are eating at restaurants. The list below tells you how big or small some common portion sizes are based on everyday objects: ? 1 oz-4 stacked dice. ? 3 oz-1 deck of cards. ? 1 tsp-1 die. ? 1 Tbsp- a ping-pong ball. ? 2 Tbsp-1 ping-pong ball. ?  cup- baseball. ? 1 cup-1 baseball. Summary  Calorie counting means keeping track of how many calories you eat and drink each day. If you eat fewer calories than your body needs, you should lose weight.  A healthy amount of weight to lose per week is usually 1-2 lb (0.5-0.9 kg). This usually means reducing your daily calorie intake by 500-750 calories.  The number of calories in a food can be found on a Nutrition Facts label. If a food does not have a Nutrition Facts label, try to look up the calories online or ask your dietitian for help.  Use your calories on foods and drinks that will fill you up, and not on foods and drinks that will leave you hungry.  Use smaller plates, glasses, and bowls to prevent overeating. This information is not intended to replace advice given to you by your health care provider. Make sure you discuss any questions you have with your health care provider. Document Released: 05/23/2005 Document Revised: 04/22/2016 Document Reviewed: 04/22/2016  Elsevier Interactive Patient Education  2017 [employer]  

## 2017-04-01 ENCOUNTER — Encounter: Payer: Self-pay | Admitting: Internal Medicine

## 2017-04-01 NOTE — Assessment & Plan Note (Signed)
Patient presents for weight gain follow-up.  She is up 5lbs today from previous visit, 318--> 323.  She has gained a total of 45 lbs since 07/2016. States that she knows what she is supposed to eat but has been eating more sweets than usual. She has also been taking over-the-counter weight loss supplement lipozene for the past 6 weeks, but states has not noticed a difference in weight.  Does not exercise.  Currently on Latuda for depression. Explained to patient this is likely contributing to her weight gain and to speak with her psychiatrist for other medical therapy options for depression. Advised patient to continue taking Latuda until she discuss this with psychiatrist as the risks of stopping it outweigh the benefits. Offered meeting with our nutritionist but patient declined and once again stated that she knows she is supposed to increase intake of vegetables and fruits and start exercising.  Will try lifestyle modifications before meeting with nutritionist.  -Stop Lipozene -Counseled on lifestyle modifications -Nutrition counseling in the future if patient agreeable -Follow-up in 3 months with goal of 5 lbs weight loss
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
## 2017-04-01 NOTE — Assessment & Plan Note (Signed)
Patient continues to smoke 1 pack per week.  States she would like to quit as she knows this would be beneficial for her health but is not ready yet. Offered information about the quit line and other resources but patient declined. Patient educated on available therapies for smoking cessation.    - Smoking cessation counseling provided - Advised patient to call [facility] for NRT therapy (if desired) when she is ready to quit

## 2017-04-09 NOTE — Progress Notes (Signed)
Internal Medicine Clinic Attending  I saw and evaluated the patient.  I personally confirmed the key portions of the history and exam documented by Dr. Santos-Sanchez and I reviewed pertinent patient test results.  The assessment, diagnosis, and plan were formulated together and I agree with the documentation in the resident's note. 

## 2017-07-04 ENCOUNTER — Other Ambulatory Visit: Payer: Self-pay | Admitting: Internal Medicine

## 2017-07-04 DIAGNOSIS — E785 Hyperlipidemia, unspecified: Secondary | ICD-10-CM

## 2017-07-04 NOTE — Telephone Encounter (Signed)
Next appt scheduled  07/07/17 with PCP.

## 2017-07-07 ENCOUNTER — Encounter: Payer: Self-pay | Admitting: Internal Medicine

## 2017-07-19 ENCOUNTER — Encounter (INDEPENDENT_AMBULATORY_CARE_PROVIDER_SITE_OTHER): Payer: Self-pay

## 2017-07-19 ENCOUNTER — Encounter: Payer: Self-pay | Admitting: Internal Medicine

## 2017-07-19 ENCOUNTER — Ambulatory Visit (INDEPENDENT_AMBULATORY_CARE_PROVIDER_SITE_OTHER): Payer: Medicaid Other | Admitting: Internal Medicine

## 2017-07-19 VITALS — BP 130/88 | HR 72 | Wt 339.8 lb

## 2017-07-19 DIAGNOSIS — Z79899 Other long term (current) drug therapy: Secondary | ICD-10-CM | POA: Diagnosis not present

## 2017-07-19 DIAGNOSIS — Z6841 Body Mass Index (BMI) 40.0 and over, adult: Secondary | ICD-10-CM

## 2017-07-19 DIAGNOSIS — R635 Abnormal weight gain: Secondary | ICD-10-CM | POA: Diagnosis not present

## 2017-07-19 DIAGNOSIS — Z1231 Encounter for screening mammogram for malignant neoplasm of breast: Secondary | ICD-10-CM

## 2017-07-19 DIAGNOSIS — F418 Other specified anxiety disorders: Secondary | ICD-10-CM

## 2017-07-19 DIAGNOSIS — I10 Essential (primary) hypertension: Secondary | ICD-10-CM | POA: Diagnosis present
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
## 2017-07-19 NOTE — Assessment & Plan Note (Signed)
Patient presents for hypertension follow-up.  She is currently on lisinopril-HCTZ 20-25 daily and reports compliance.  BP today 130/88.  We will continue current regimen as blood pressure currently at goal.  - Continue lisinopril-HCTZ 20-25 mg daily

## 2017-07-19 NOTE — Patient Instructions (Addendum)
Rebekah Howard,   Your blood pressure looks great! Continue taking your blood pressure medicine as usual.   Continue to work on your diet. Remember to eat 3 balanced meals per day with snack in between them. Do not skip meals as this slows down your metabolism.   Please start exercising at least 2-3 times per week. You can do work outs at home.   Medications for wright loss are not great. You can try Orlistat (brand name Alli). This is an over the counter medication. Read the box instructions for intake, usually 60 mg (1 tablet) 3 times a day. Remember that you can experience diarrhea and changes in stool color with this medication.  Please make a follow up appointment in 3 months for weight gain follow up.   I placed an order for your mammogram. They will call you to schedule this.   Please call if you have any questions.

## 2017-07-19 NOTE — Assessment & Plan Note (Signed)
Patient presents for depression and anxiety follow-up.  She is no longer following up with outpatient psychiatry.  She stopped Latuda and topiramate 2 months ago and states she is feeling much better.  Denies depressed mood, poor concentration, decreased interests, guilt, difficulty sleeping, and SI/HI.  We will not start treatment at this time as patient has been feeling like her usual self for the past 2 months and does not report concerning symptoms.  We will continue to monitor.  - Continue to monitor

## 2017-07-19 NOTE — Assessment & Plan Note (Addendum)
Patient presents for weight gain follow-up.  She has gained 17 pounds since last seen on 03/2017.  States she eats well and balanced meals, however on further questioning patient reports eating 2 meals per day.  She is currently not exercising.  States she is unable to go to the gym due to transportation issues and unable to walk outside as she does not live in a safe neighborhood.  She stopped taking Latuda 2 months ago.  Once again I offered patient referral to nutrition services, and once again patient declined.  Discussed with patient there are many ways to exercise that did not involve a gym or walking outside and that she can try exercising at home.  She is agreeable to this.  She is also requesting prescription for weight loss medication.  I discussed with patient my concerns regarding this including high cost as these are not covered by insurance, side effects, and compliance with exercise as she would still need to diet and exercise while on this medications.  Recommended over-the-counter orlistat and educated patient on side effects.  She will think about starting this medication.  I did not discuss surgical options at this time as she has not failed medical management. I asked her to follow-up in 3 months for weight gain.  - Educated patient on healthy diet and exercise - We will offer referral for nutrition services once again in future visits  - Recommended over-the-counter orlistat which patient will think about.  Discussed side effects - Follow up in 3 months

## 2017-07-20 ENCOUNTER — Other Ambulatory Visit: Payer: Self-pay | Admitting: Internal Medicine

## 2017-07-20 DIAGNOSIS — R7303 Prediabetes: Secondary | ICD-10-CM

## 2017-07-20 LAB — HEMOGLOBIN A1C
Est. average glucose Bld gHb Est-mCnc: 123 mg/dL
Hgb A1c MFr Bld: 5.9 % — ABNORMAL HIGH (ref 4.8–5.6)

## 2017-07-20 MED ORDER — METFORMIN HCL ER 500 MG PO TB24: 500.0000 mg | ORAL_TABLET | Freq: Every day | ORAL | 0 refills | Status: DC

## 2017-07-21 NOTE — Progress Notes (Signed)
Internal Medicine Clinic Attending  Case discussed with Dr. Santos-Sanchez at the time of the visit.  We reviewed the resident's history and exam and pertinent patient test results.  I agree with the assessment, diagnosis, and plan of care documented in the resident's note.    

## 2017-09-05 ENCOUNTER — Ambulatory Visit
Admission: RE | Admit: 2017-09-05 | Discharge: 2017-09-05 | Disposition: A | Discharge status: Home / Self Care | Payer: Medicaid Other | Source: Ambulatory Visit | Attending: Internal Medicine | Admitting: Internal Medicine

## 2017-09-05 DIAGNOSIS — Z1231 Encounter for screening mammogram for malignant neoplasm of breast: Secondary | ICD-10-CM

## 2017-09-07 ENCOUNTER — Other Ambulatory Visit: Payer: Self-pay | Admitting: Internal Medicine

## 2017-10-04 ENCOUNTER — Other Ambulatory Visit: Payer: Self-pay | Admitting: Internal Medicine

## 2017-10-04 DIAGNOSIS — R7303 Prediabetes: Secondary | ICD-10-CM

## 2017-10-25 ENCOUNTER — Ambulatory Visit (INDEPENDENT_AMBULATORY_CARE_PROVIDER_SITE_OTHER): Payer: Medicaid Other | Admitting: Internal Medicine

## 2017-10-25 ENCOUNTER — Other Ambulatory Visit: Payer: Self-pay

## 2017-10-25 VITALS — BP 130/83 | HR 61 | Temp 97.6°F | Wt 330.2 lb

## 2017-10-25 DIAGNOSIS — F339 Major depressive disorder, recurrent, unspecified: Secondary | ICD-10-CM

## 2017-10-25 DIAGNOSIS — Z79899 Other long term (current) drug therapy: Secondary | ICD-10-CM

## 2017-10-25 DIAGNOSIS — M79676 Pain in unspecified toe(s): Secondary | ICD-10-CM

## 2017-10-25 DIAGNOSIS — S40261A Insect bite (nonvenomous) of right shoulder, initial encounter: Secondary | ICD-10-CM | POA: Diagnosis not present

## 2017-10-25 DIAGNOSIS — W57XXXA Bitten or stung by nonvenomous insect and other nonvenomous arthropods, initial encounter: Secondary | ICD-10-CM

## 2017-10-25 DIAGNOSIS — I10 Essential (primary) hypertension: Secondary | ICD-10-CM | POA: Diagnosis not present

## 2017-10-25 DIAGNOSIS — E785 Hyperlipidemia, unspecified: Secondary | ICD-10-CM
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
## 2017-10-25 NOTE — Patient Instructions (Signed)
I am glad you are doing so well today. I think your toe pain is unlikely to be from diabetes.  Any physical activity to lose weight or work on your low back strength can help with avoiding more problems from pinched nerves or other back issues.  We will check your blood tests today and see how you are doing with the atorvastatin.

## 2017-10-26 LAB — LIPID PANEL
CHOLESTEROL TOTAL: 139 mg/dL (ref 100–199)
Chol/HDL Ratio: 3 ratio (ref 0.0–4.4)
HDL: 46 mg/dL (ref >39)
LDL Calculated: 76 mg/dL (ref 0–99)
TRIGLYCERIDES: 87 mg/dL (ref 0–149)
VLDL Cholesterol Cal: 17 mg/dL (ref 5–40)

## 2017-10-26 LAB — BMP8+ANION GAP
ANION GAP: 12 mmol/L (ref 10.0–18.0)
BUN / CREAT RATIO: 17 (ref 9–23)
BUN: 13 mg/dL (ref 6–24)
CO2: 25 mmol/L (ref 20–29)
Calcium: 8.9 mg/dL (ref 8.7–10.2)
Chloride: 105 mmol/L (ref 96–106)
Creatinine, Ser: 0.76 mg/dL (ref 0.57–1.00)
GFR calc Af Amer: 101 mL/min/{1.73_m2} (ref >59)
GFR calc non Af Amer: 88 mL/min/{1.73_m2} (ref >59)
GLUCOSE: 88 mg/dL (ref 65–99)
POTASSIUM: 4 mmol/L (ref 3.5–5.2)
SODIUM: 142 mmol/L (ref 134–144)

## 2017-10-27 DIAGNOSIS — S40261A Insect bite (nonvenomous) of right shoulder, initial encounter: Secondary | ICD-10-CM | POA: Insufficient documentation

## 2017-10-27 DIAGNOSIS — W57XXXA Bitten or stung by nonvenomous insect and other nonvenomous arthropods, initial encounter: Secondary | ICD-10-CM

## 2017-10-27 NOTE — Assessment & Plan Note (Signed)
She is curious today about whether the atorvastatin is working as desired.  She is no longer taking the [place] for her depression which was felt to be contributory to her previous hyperlipidemia. We will check a lipid profile today, but recommend continuing the atorvastatin if she is getting an appropriate response

## 2017-10-27 NOTE — Progress Notes (Signed)
Medicine attending: Medical history, presenting problems, physical findings, and medications, reviewed with resident physician Dr Christopher Rice on the day of the patient visit and I concur with his evaluation and management plan. 

## 2017-10-27 NOTE — Assessment & Plan Note (Addendum)
The linear pattern of healing hyperpigmented lesions are very consistent with bedbug bites. She did not see any insects directly. She has no new sites since her previous travel and hotel stay in [place] which suggests no transmission of insects to the her home.  These are well-healing and do not require any specific further treatment. Plan: Recommended she call back if new similar marks start to appear as this would suggest needing extermination at home

## 2017-11-24 NOTE — Progress Notes (Signed)
error 

## 2017-11-24 NOTE — Progress Notes (Signed)
Overnight pulse oximetry started on 11/07/17

## 2017-12-01 ENCOUNTER — Encounter: Payer: Self-pay | Admitting: Internal Medicine

## 2017-12-01 ENCOUNTER — Other Ambulatory Visit: Payer: Self-pay

## 2017-12-01 ENCOUNTER — Ambulatory Visit: Payer: Medicaid Other | Admitting: Internal Medicine

## 2017-12-01 VITALS — BP 104/66 | HR 60 | Temp 98.2°F | Ht 66.0 in | Wt 328.7 lb

## 2017-12-01 DIAGNOSIS — R7303 Prediabetes: Secondary | ICD-10-CM | POA: Diagnosis not present

## 2017-12-01 DIAGNOSIS — Z6841 Body Mass Index (BMI) 40.0 and over, adult: Secondary | ICD-10-CM

## 2017-12-01 DIAGNOSIS — I10 Essential (primary) hypertension: Secondary | ICD-10-CM | POA: Diagnosis present

## 2017-12-01 DIAGNOSIS — Z7984 Long term (current) use of oral hypoglycemic drugs: Secondary | ICD-10-CM

## 2017-12-01 DIAGNOSIS — Z79899 Other long term (current) drug therapy: Secondary | ICD-10-CM | POA: Diagnosis not present

## 2017-12-01 LAB — POCT GLYCOSYLATED HEMOGLOBIN (HGB A1C): Hemoglobin A1C: 5.6 % (ref 4.0–5.6)

## 2017-12-01 LAB — GLUCOSE, CAPILLARY: Glucose-Capillary: 76 mg/dL (ref 70–99)

## 2017-12-01 NOTE — Assessment & Plan Note (Signed)
HTN, controlled: BP at goal. Recent BMP from 5/22 with normal renal function. Will continue lisinopril-HCTZ.
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
## 2017-12-01 NOTE — Patient Instructions (Signed)
Ms. Bickle,   Congratulations on losing weight! Continue keeping an eye on your diet and remember to eat foods low in sugar/carbohydrates.   Your A1c is 5.6. This is a great improvement since last time.   Your blood pressure also looked great. Continue taking lisinopril-HCTZ as usual.   Please call us if you have any questions or any concerns. I will see you back in 6 months or sooner if needed.   - Dr. Frederico Hamman

## 2017-12-01 NOTE — Assessment & Plan Note (Signed)
Prediabetes: Currently on metformin 500 mg XL QD and compliant. A1c 5.9-> 5.6. Counseled on low carb diet as well as extreme importance of exercising as above. Will continue current therapy.

## 2017-12-01 NOTE — Assessment & Plan Note (Addendum)
Obesity: Patient has lost 11 lbs since last visit on 07/2017 at which time metformin was started for treatment of prediabetes. She also reports adhering to a low carbohydrate diet since last seen. She continues to have a difficulty time exercising. She used to walk outside but states it is too hot outside to walk during the summer. We had a long discussion about the importance of exercising and how medications by themselves will not help her get to her desire weight. Discussed types of exercises she can do at home and also possibility of joining a gym. Asked her to follow up in 6 months.

## 2017-12-05 NOTE — Progress Notes (Signed)
Internal Medicine Clinic Attending  Case discussed with Dr. Santos-Sanchez at the time of the visit.  We reviewed the resident's history and exam and pertinent patient test results.  I agree with the assessment, diagnosis, and plan of care documented in the resident's note.    

## 2017-12-15 ENCOUNTER — Telehealth: Payer: Self-pay | Admitting: *Deleted

## 2017-12-15 NOTE — Telephone Encounter (Addendum)
Walk-In: Pt walks in requesting to be excused from jury duty.  Pt is to report for duty on Monday July 15th, 2019 (in 3 days) at [facility].  Pt states she receives disability due to her "arthritis and joint pain" and feels she should be excused from jury duty.  She also stated she doesn't have transportation.  Request was given to pt's pcp Frederico Hamman) for consideration and after review of pt's chart, it was decided that pt does not meet criteria for an excusal.  Pt was upset and stated " I will sue yall if I go to jail".  At that point, her request was taken the attending Dareen Piano) for a second review, and again, it was decided that pt does not meet criteria for excusal.Rebekah Schuelke Cassady7/12/20194:39 PM

## 2018-03-01 ENCOUNTER — Other Ambulatory Visit: Payer: Self-pay | Admitting: Internal Medicine

## 2018-03-01 DIAGNOSIS — R7303 Prediabetes: Secondary | ICD-10-CM

## 2018-03-30 ENCOUNTER — Ambulatory Visit: Payer: Medicaid Other | Admitting: Internal Medicine

## 2018-03-30 ENCOUNTER — Encounter: Payer: Self-pay | Admitting: Internal Medicine

## 2018-03-30 VITALS — BP 106/69 | HR 75 | Temp 98.4°F | Wt 327.9 lb

## 2018-03-30 DIAGNOSIS — R21 Rash and other nonspecific skin eruption: Secondary | ICD-10-CM | POA: Diagnosis not present

## 2018-03-30 DIAGNOSIS — L299 Pruritus, unspecified: Secondary | ICD-10-CM

## 2018-03-30 DIAGNOSIS — I10 Essential (primary) hypertension: Secondary | ICD-10-CM | POA: Diagnosis not present

## 2018-03-30 DIAGNOSIS — Z79899 Other long term (current) drug therapy: Secondary | ICD-10-CM | POA: Diagnosis not present

## 2018-03-30 DIAGNOSIS — L818 Other specified disorders of pigmentation: Secondary | ICD-10-CM | POA: Diagnosis not present

## 2018-03-30 DIAGNOSIS — Z23 Encounter for immunization: Secondary | ICD-10-CM

## 2018-03-30 DIAGNOSIS — B372 Candidiasis of skin and nail: Secondary | ICD-10-CM | POA: Insufficient documentation

## 2018-03-30 MED ORDER — FLUCONAZOLE 100 MG PO TABS: 100.0000 mg | ORAL_TABLET | Freq: Every day | ORAL | 0 refills | Status: AC

## 2018-03-30 MED ORDER — NYSTATIN 100000 UNIT/GM EX POWD: Freq: Two times a day (BID) | CUTANEOUS | 1 refills | Status: DC

## 2018-03-30 NOTE — Assessment & Plan Note (Signed)
Rebekah Howard presents with a 4-month history of severe itching in her back and suprapubic region.  On exam, she has large areas of hyperpigmentation associated with satellite lesions in skin folds at her bilateral flanks and lower abdomen consistent with fungal infection. Suspect this is likely secondary to candidal intertrigo.  - Diflucan 100 mg x 2 - Nystatin powder BID  - Keep area dry  - encouraged weight loss
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
## 2018-03-30 NOTE — Patient Instructions (Addendum)
Rebekah Howard,   For the itching, I sent a prescription for Diflucan, you can take 1 tablet today and 1 tablet on Sunday.   You should also use Nystatin powder two times a day. Make sure to keep the area dry weight as moisture can make it worse. Continue working on weight loss. This will help prevent this kind in infection in the future.   You received your flu shot today.

## 2018-03-30 NOTE — Assessment & Plan Note (Signed)
Well-controlled on lisinopril-HCTZ 20-25 mg daily.  - Continue current regimen

## 2018-04-02 ENCOUNTER — Other Ambulatory Visit: Payer: Self-pay | Admitting: Internal Medicine

## 2018-04-02 NOTE — Addendum Note (Signed)
Addended by: Lalla Brothers T on: 04/02/2018 07:38 AM   Modules accepted: Level of Service

## 2018-04-02 NOTE — Progress Notes (Signed)
Internal Medicine Clinic Attending  Case discussed with Dr. Santos-Sanchez at the time of the visit.  We reviewed the resident's history and exam and pertinent patient test results.  I agree with the assessment, diagnosis, and plan of care documented in the resident's note.    

## 2018-04-06 ENCOUNTER — Other Ambulatory Visit: Payer: Self-pay | Admitting: Internal Medicine

## 2018-04-06 DIAGNOSIS — L299 Pruritus, unspecified: Secondary | ICD-10-CM

## 2018-07-24 ENCOUNTER — Telehealth: Payer: Self-pay

## 2018-07-24 ENCOUNTER — Ambulatory Visit (INDEPENDENT_AMBULATORY_CARE_PROVIDER_SITE_OTHER): Payer: Medicaid Other | Admitting: Internal Medicine

## 2018-07-24 ENCOUNTER — Other Ambulatory Visit: Payer: Self-pay

## 2018-07-24 VITALS — BP 131/76 | HR 61 | Temp 98.1°F | Ht 66.0 in | Wt 337.9 lb

## 2018-07-24 DIAGNOSIS — J111 Influenza due to unidentified influenza virus with other respiratory manifestations: Secondary | ICD-10-CM | POA: Diagnosis not present

## 2018-07-24 MED ORDER — DM-APAP-CPM 10-325-2 MG PO TABS: 2.0000 | ORAL_TABLET | Freq: Four times a day (QID) | ORAL | 0 refills | Status: DC | PRN

## 2018-07-24 NOTE — Patient Instructions (Signed)
[name],   Your symptoms are due to influenza.  I expect you will start feeling better within the next few days.  To manage your nasal congestion I sent a prescription for Coricidin HBP to your pharmacy.  You can take 2 tablets every 4-6 hours as needed for congestion.  Please do not take more than 12 tablets in a 24-hour period.   If you start to develop new fevers, chills, a productive cough, or if you have symptoms continue to worsen please give [place] a call and let us know as we may need to order a chest x-ray to evaluate for pneumonia.  Call us if you have any questions or concerns.  -Dr. Frederico Hamman

## 2018-07-24 NOTE — Telephone Encounter (Signed)
Pt was seen in [facility] today, RX for Coricidin HBP sent to pharmacy.  Pt states RX is OTC and will cost her over $10 and she can't afford it.  Want's something different called in.  Will forward to Dr. Frederico Hamman. SChaplin, RN,BSN

## 2018-07-24 NOTE — Telephone Encounter (Signed)
Rebekah Howard has high blood pressure which limits the decongestants I am able to recommend for her. Coricidin HBP is the safest one to prescribe. She may want to try a different pharmacy. I did quick search online and found that [facility] sells 20 tablets for $6-7 and [facility] sells the same amount for $7-8.

## 2018-07-24 NOTE — Telephone Encounter (Signed)
TC to patient, informed pt MD is only recommending Coricidin HBP due to pt's high blood pressure.  Advised pt to seek medication at [facility] or [facility], she verbalized understanding. SChaplin, RN,BSN

## 2018-07-25 ENCOUNTER — Encounter: Payer: Self-pay | Admitting: Internal Medicine

## 2018-07-25 DIAGNOSIS — J111 Influenza due to unidentified influenza virus with other respiratory manifestations: Secondary | ICD-10-CM | POA: Insufficient documentation
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
## 2018-07-26 NOTE — Progress Notes (Signed)
Internal Medicine Clinic Attending  Case discussed with Dr. Santos-Sanchez at the time of the visit.  We reviewed the resident's history and exam and pertinent patient test results.  I agree with the assessment, diagnosis, and plan of care documented in the resident's note.    

## 2018-07-30 ENCOUNTER — Other Ambulatory Visit: Payer: Self-pay | Admitting: Internal Medicine

## 2018-07-30 DIAGNOSIS — E785 Hyperlipidemia, unspecified: Secondary | ICD-10-CM

## 2018-08-13 ENCOUNTER — Other Ambulatory Visit: Payer: Self-pay | Admitting: Internal Medicine

## 2018-08-13 DIAGNOSIS — Z1231 Encounter for screening mammogram for malignant neoplasm of breast: Secondary | ICD-10-CM

## 2018-08-29 ENCOUNTER — Other Ambulatory Visit: Payer: Self-pay | Admitting: Internal Medicine

## 2018-08-29 DIAGNOSIS — R7303 Prediabetes: Secondary | ICD-10-CM

## 2018-09-10 ENCOUNTER — Ambulatory Visit: Payer: Self-pay

## 2018-10-24 ENCOUNTER — Ambulatory Visit: Payer: Self-pay

## 2018-10-25 ENCOUNTER — Other Ambulatory Visit: Payer: Self-pay | Admitting: Internal Medicine

## 2018-11-05 ENCOUNTER — Encounter: Payer: Self-pay | Admitting: Internal Medicine

## 2018-11-05 ENCOUNTER — Ambulatory Visit (INDEPENDENT_AMBULATORY_CARE_PROVIDER_SITE_OTHER): Payer: Medicaid Other | Admitting: Internal Medicine

## 2018-11-05 ENCOUNTER — Other Ambulatory Visit: Payer: Self-pay

## 2018-11-05 VITALS — BP 111/66 | HR 77 | Temp 98.1°F | Wt 335.6 lb

## 2018-11-05 DIAGNOSIS — F17201 Nicotine dependence, unspecified, in remission: Secondary | ICD-10-CM | POA: Diagnosis not present

## 2018-11-05 DIAGNOSIS — R7303 Prediabetes: Secondary | ICD-10-CM

## 2018-11-05 DIAGNOSIS — Z79899 Other long term (current) drug therapy: Secondary | ICD-10-CM

## 2018-11-05 DIAGNOSIS — Z720 Tobacco use: Secondary | ICD-10-CM

## 2018-11-05 DIAGNOSIS — I10 Essential (primary) hypertension: Secondary | ICD-10-CM

## 2018-11-05 DIAGNOSIS — E785 Hyperlipidemia, unspecified: Secondary | ICD-10-CM

## 2018-11-05 DIAGNOSIS — B372 Candidiasis of skin and nail: Secondary | ICD-10-CM | POA: Diagnosis not present

## 2018-11-05 DIAGNOSIS — L299 Pruritus, unspecified: Secondary | ICD-10-CM

## 2018-11-05 DIAGNOSIS — Z7984 Long term (current) use of oral hypoglycemic drugs: Secondary | ICD-10-CM | POA: Diagnosis not present

## 2018-11-05 LAB — POCT GLYCOSYLATED HEMOGLOBIN (HGB A1C): Hemoglobin A1C: 5.6 % (ref 4.0–5.6)

## 2018-11-05 LAB — GLUCOSE, CAPILLARY: Glucose-Capillary: 111 mg/dL — ABNORMAL HIGH (ref 70–99)

## 2018-11-05 MED ORDER — FLUCONAZOLE 150 MG PO TABS: 150.0000 mg | ORAL_TABLET | Freq: Every day | ORAL | 0 refills | Status: DC

## 2018-11-05 MED ORDER — NYSTATIN 100000 UNIT/GM EX POWD: CUTANEOUS | 3 refills | Status: DC

## 2018-11-05 NOTE — Assessment & Plan Note (Signed)
Please assessment and plan for skin candidiasis.
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
## 2018-11-05 NOTE — Assessment & Plan Note (Signed)
Well controlled on lisinopril-HCTZ 20-25 mg QD. Will continue.  - BMP today to check renal function and K

## 2018-11-05 NOTE — Assessment & Plan Note (Signed)
On metformin 500 mg XL daily and compliant.  A1c remains at 5.6.  We will continue to counsel on importance of low carbohydrate diet and weight loss.

## 2018-11-05 NOTE — Assessment & Plan Note (Signed)
On atorvastatin 40 mg QD and compliant. Lipid profile within normal limits after initiation of statin.  - Continue current regimen

## 2018-11-05 NOTE — Patient Instructions (Signed)
Ms. Luscher,   For the itching I sent this to medications your pharmacy.  Nystatin powder you can use this  2-3 times a day over affected area  Diflucan- this is a tablet.  You will take 1 today and 1 3 days later.  We will call you with the results of your A1c.  Call us if you have any questions or concerns.  - Dr. Frederico Hamman

## 2018-11-05 NOTE — Assessment & Plan Note (Signed)
Quit smoking 3 months ago. Congratulated patient on her achievement.

## 2018-11-05 NOTE — Assessment & Plan Note (Signed)
Rebekah Howard complains of itching in her back, abdomen, and vaginal area for the past month. States it feels similar to months ago when she was treated for skin candidiasis. She was prescribed nystatin powder which helped with the itching. On exam, she has erythematous patches of skin in the skin fold of her back and abdomen as well as on the bilateral labia. Patient deferred vaginal exam. Will treat for skin and vaginal candidiasis - Nystatin powder BID-TID PRN  - Diflucan 150 mg today and at 72 hours  - Counseled to keep skin fold areas dry  - A1c

## 2018-11-06 NOTE — Progress Notes (Signed)
Internal Medicine Clinic Attending  Case discussed with Dr. Santos at the time of the visit.  We reviewed the resident's history and exam and pertinent patient test results.  I agree with the assessment, diagnosis, and plan of care documented in the resident's note.    

## 2018-11-26 ENCOUNTER — Ambulatory Visit: Payer: Medicaid Other

## 2018-11-27 ENCOUNTER — Other Ambulatory Visit: Payer: Self-pay | Admitting: Internal Medicine

## 2018-11-27 DIAGNOSIS — E785 Hyperlipidemia, unspecified: Secondary | ICD-10-CM

## 2018-11-27 DIAGNOSIS — R7303 Prediabetes: Secondary | ICD-10-CM

## 2018-11-28 ENCOUNTER — Other Ambulatory Visit: Payer: Self-pay

## 2018-11-28 DIAGNOSIS — R7303 Prediabetes: Secondary | ICD-10-CM

## 2018-11-28 DIAGNOSIS — E785 Hyperlipidemia, unspecified: Secondary | ICD-10-CM

## 2018-11-28 MED ORDER — ATORVASTATIN CALCIUM 40 MG PO TABS: 40.0000 mg | ORAL_TABLET | Freq: Every day | ORAL | 6 refills | Status: DC

## 2018-11-28 MED ORDER — LISINOPRIL-HYDROCHLOROTHIAZIDE 20-25 MG PO TABS: 1.0000 | ORAL_TABLET | Freq: Every day | ORAL | 6 refills | Status: DC

## 2018-11-28 MED ORDER — METFORMIN HCL ER 500 MG PO TB24: ORAL_TABLET | ORAL | 1 refills | Status: DC

## 2018-11-28 NOTE — Telephone Encounter (Signed)
Patient called again regarding these refills. States she only has 2 tabs left of BP med. Will route to attending pool.

## 2018-11-28 NOTE — Telephone Encounter (Signed)
metFORMIN (GLUCOPHAGE-XR) 500 MG 24 hr tablet  lisinopril-hydrochlorothiazide (ZESTORETIC) 20-25 MG tablet   atorvastatin (LIPITOR) 40 MG tablet   Refill request @  [facility] - [place], [place] - [address] [phone] (Phone) [phone] (Fax)

## 2018-12-17 ENCOUNTER — Ambulatory Visit (INDEPENDENT_AMBULATORY_CARE_PROVIDER_SITE_OTHER): Payer: Medicaid Other | Admitting: Internal Medicine

## 2018-12-17 ENCOUNTER — Other Ambulatory Visit: Payer: Self-pay

## 2018-12-17 VITALS — BP 117/71 | HR 77 | Temp 98.1°F | Wt 339.2 lb

## 2018-12-17 DIAGNOSIS — R002 Palpitations: Secondary | ICD-10-CM | POA: Diagnosis not present

## 2018-12-17 DIAGNOSIS — Z79899 Other long term (current) drug therapy: Secondary | ICD-10-CM

## 2018-12-17 DIAGNOSIS — R252 Cramp and spasm: Secondary | ICD-10-CM | POA: Insufficient documentation

## 2018-12-17 DIAGNOSIS — R61 Generalized hyperhidrosis: Secondary | ICD-10-CM | POA: Diagnosis not present

## 2018-12-17 NOTE — Patient Instructions (Addendum)
Ms. Markwood,   I am not sure what is causing your leg cramps. We are checking some labs today to get to the bottom of this. We will call you with the results. In the meantime call us if you have any questions or concerns.   - Dr.Santos

## 2018-12-18 ENCOUNTER — Telehealth: Payer: Self-pay | Admitting: *Deleted

## 2018-12-18 ENCOUNTER — Encounter: Payer: Self-pay | Admitting: Internal Medicine

## 2018-12-18 LAB — BMP8+ANION GAP
Anion Gap: 16 mmol/L (ref 10.0–18.0)
BUN/Creatinine Ratio: 14 (ref 9–23)
BUN: 13 mg/dL (ref 6–24)
CO2: 22 mmol/L (ref 20–29)
Calcium: 9.1 mg/dL (ref 8.7–10.2)
Chloride: 104 mmol/L (ref 96–106)
Creatinine, Ser: 0.96 mg/dL (ref 0.57–1.00)
GFR calc Af Amer: 76 mL/min/{1.73_m2} (ref >59)
GFR calc non Af Amer: 66 mL/min/{1.73_m2} (ref >59)
Glucose: 87 mg/dL (ref 65–99)
Potassium: 3.8 mmol/L (ref 3.5–5.2)
Sodium: 142 mmol/L (ref 134–144)

## 2018-12-18 LAB — TSH: TSH: 1.17 u[IU]/mL (ref 0.450–4.500)

## 2018-12-18 LAB — VITAMIN B12: Vitamin B-12: 1254 pg/mL — ABNORMAL HIGH (ref 232–1245)

## 2018-12-18 NOTE — Telephone Encounter (Signed)
Patient notified of below and was very appreciative. Hubbard Hartshorn, RN, BSN
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
## 2018-12-18 NOTE — Telephone Encounter (Signed)
Thank you :)

## 2018-12-18 NOTE — Telephone Encounter (Signed)
-----   Message from Welford Roche, MD sent at 12/18/2018  8:42 AM EDT ----- Regarding: Results Could you please call patient to let her know her blood was all normal. We will see how her symptoms do once she is back on her combo blood pressure pill. She can call us if  symptoms worsen. Thank you!

## 2018-12-18 NOTE — Assessment & Plan Note (Addendum)
Patient presents for evaluation of intermittent leg cramps at night that started about 2 weeks ago. She states the pain is unbearable some times. She has tried several remedies and mustard seems to help. Last night, when she was experiencing another episode, she developed palpitations and diaphoresis. She thought about going to the ED, but decided not to. She thinks this is associated to recent changes in her BP medications. She is on the combo pill lisinopril-HCTZ and was given the medications separately when she went to pick up her refill 2 weeks ago. Ordered BMP to r/o electrolyte abnormalities. Unclear if her symptoms are truly related to recent change in medication but called pharmacy and they will dispense the combination pill. Advised her to call us if her symptoms persist or worsened.

## 2018-12-19 NOTE — Progress Notes (Signed)
Internal Medicine Clinic Attending  Case discussed with Dr. Santos-Sanchez at the time of the visit.  We reviewed the resident's history and exam and pertinent patient test results.  I agree with the assessment, diagnosis, and plan of care documented in the resident's note.    

## 2019-01-07 ENCOUNTER — Ambulatory Visit
Admission: RE | Admit: 2019-01-07 | Discharge: 2019-01-07 | Disposition: A | Discharge status: Home / Self Care | Payer: Medicaid Other | Source: Ambulatory Visit | Attending: *Deleted | Admitting: *Deleted

## 2019-01-07 ENCOUNTER — Other Ambulatory Visit: Payer: Self-pay

## 2019-01-07 DIAGNOSIS — Z1231 Encounter for screening mammogram for malignant neoplasm of breast: Secondary | ICD-10-CM | POA: Diagnosis not present

## 2019-02-20 ENCOUNTER — Telehealth: Payer: Self-pay | Admitting: Obstetrics & Gynecology

## 2019-02-20 NOTE — Telephone Encounter (Signed)
Spoke with patient about her visit with Dr Ihor Dow, and that she would need to have the MyChart app downloaded to have this visit. She was given the link to get signed up, as well as the number to the help desk just in case she needed help. I instructed her once she signed up for MyChart to go to her app store and download the MyChart app which is a red folder with a white heart. Then she should select . Patient stated she understood.

## 2019-02-21 ENCOUNTER — Telehealth: Payer: Medicaid Other | Admitting: Obstetrics & Gynecology

## 2019-02-21 ENCOUNTER — Other Ambulatory Visit: Payer: Self-pay

## 2019-02-28 ENCOUNTER — Ambulatory Visit: Payer: Medicaid Other | Admitting: *Deleted

## 2019-02-28 ENCOUNTER — Other Ambulatory Visit: Payer: Self-pay

## 2019-02-28 DIAGNOSIS — Z23 Encounter for immunization: Secondary | ICD-10-CM | POA: Diagnosis not present

## 2019-03-27 ENCOUNTER — Ambulatory Visit (INDEPENDENT_AMBULATORY_CARE_PROVIDER_SITE_OTHER): Payer: Medicaid Other | Admitting: Obstetrics and Gynecology

## 2019-03-27 ENCOUNTER — Other Ambulatory Visit (HOSPITAL_COMMUNITY)
Admission: RE | Admit: 2019-03-27 | Discharge: 2019-03-27 | Disposition: A | Discharge status: Home / Self Care | Payer: Medicaid Other | Source: Ambulatory Visit | Attending: Obstetrics and Gynecology | Admitting: Obstetrics and Gynecology

## 2019-03-27 ENCOUNTER — Encounter: Payer: Self-pay | Admitting: Obstetrics and Gynecology

## 2019-03-27 ENCOUNTER — Other Ambulatory Visit: Payer: Self-pay

## 2019-03-27 VITALS — BP 118/83 | HR 62 | Ht 66.0 in | Wt 327.9 lb

## 2019-03-27 DIAGNOSIS — L292 Pruritus vulvae: Secondary | ICD-10-CM

## 2019-03-27 DIAGNOSIS — Z01419 Encounter for gynecological examination (general) (routine) without abnormal findings: Secondary | ICD-10-CM | POA: Insufficient documentation

## 2019-03-27 DIAGNOSIS — N9089 Other specified noninflammatory disorders of vulva and perineum: Secondary | ICD-10-CM

## 2019-03-27 DIAGNOSIS — Z0000 Encounter for general adult medical examination without abnormal findings: Secondary | ICD-10-CM | POA: Diagnosis not present

## 2019-03-27 MED ORDER — CLOBETASOL PROPIONATE 0.05 % EX CREA: 1.0000 "application " | TOPICAL_CREAM | Freq: Two times a day (BID) | CUTANEOUS | 2 refills | Status: DC
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
## 2019-03-27 NOTE — Progress Notes (Signed)
Itching around Vagina area w/ no odor

## 2019-04-02 LAB — CYTOLOGY - PAP
Comment: NEGATIVE
Diagnosis: NEGATIVE
High risk HPV: NEGATIVE

## 2019-04-16 ENCOUNTER — Other Ambulatory Visit: Payer: Self-pay

## 2019-04-16 NOTE — Telephone Encounter (Signed)
lisinopril-hydrochlorothiazide (ZESTORETIC) 20-25 MG tablet, REFILL REQUEST @  [facility] - [place], [place] - [address] [phone] (Phone) [phone] (Fax)   Pt states per the pharmacy, Dr. Frederico Hamman is not Medicaid enrolled. Please call pt back.

## 2019-04-17 MED ORDER — LISINOPRIL-HYDROCHLOROTHIAZIDE 20-25 MG PO TABS: 1.0000 | ORAL_TABLET | Freq: Every day | ORAL | 6 refills | Status: DC

## 2019-04-17 NOTE — Addendum Note (Signed)
Addended by: Aldine Contes on: 04/17/2019 09:19 AM   Modules accepted: Orders

## 2019-04-17 NOTE — Telephone Encounter (Signed)
Filled this

## 2019-05-16 ENCOUNTER — Ambulatory Visit: Payer: Medicaid Other | Admitting: Obstetrics and Gynecology

## 2019-05-28 ENCOUNTER — Other Ambulatory Visit: Payer: Self-pay | Admitting: Internal Medicine

## 2019-05-28 ENCOUNTER — Other Ambulatory Visit: Payer: Self-pay | Admitting: *Deleted

## 2019-05-28 DIAGNOSIS — R7303 Prediabetes: Secondary | ICD-10-CM

## 2019-05-28 MED ORDER — METFORMIN HCL ER 500 MG PO TB24: ORAL_TABLET | ORAL | 1 refills | Status: DC

## 2019-05-28 NOTE — Telephone Encounter (Signed)
Pt was called / informed of refill.

## 2019-05-28 NOTE — Telephone Encounter (Signed)
Pls contact pharmacy pt is having a hard getting her diabetic medicine; pt contact [phone]   Pls contact:[facility] - [place], [place] - [address]

## 2019-05-28 NOTE — Telephone Encounter (Signed)
Called pt - stated needs Metformin refill but Dr. Frederico Hamman is not Medicaid enrolled per pharmacy. I will ask the Attending to refill med. Thanks

## 2019-05-28 NOTE — Telephone Encounter (Signed)
Called pt - pharmacy stated Dr Frederico Hamman is not Medicaid enrolled. I will ask today's Attending to refill rx. Thanks

## 2019-06-10 ENCOUNTER — Ambulatory Visit
Admission: EM | Admit: 2019-06-10 | Discharge: 2019-06-10 | Disposition: A | Discharge status: Home / Self Care | Payer: Medicaid Other | Attending: Physician Assistant | Admitting: Physician Assistant

## 2019-06-10 DIAGNOSIS — R21 Rash and other nonspecific skin eruption: Secondary | ICD-10-CM | POA: Diagnosis not present

## 2019-06-10 MED ORDER — TRIAMCINOLONE ACETONIDE 0.1 % EX CREA: 1.0000 "application " | TOPICAL_CREAM | Freq: Two times a day (BID) | CUTANEOUS | 0 refills | Status: DC

## 2019-06-10 NOTE — Discharge Instructions (Addendum)
Triamcinolone cream as directed. Ice compress, allergy medicine to help with itching. Follow up with PCP if no improvement.

## 2019-06-10 NOTE — ED Triage Notes (Signed)
Pt c/o rash to rt postieror forearm since Saturday. States got a used bed set a month ago. Denies any changes to detergents or soaps.
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
## 2019-06-11 ENCOUNTER — Ambulatory Visit: Payer: Medicaid Other | Admitting: Internal Medicine

## 2019-06-12 ENCOUNTER — Ambulatory Visit (INDEPENDENT_AMBULATORY_CARE_PROVIDER_SITE_OTHER): Payer: Medicaid Other | Admitting: Internal Medicine

## 2019-06-12 ENCOUNTER — Other Ambulatory Visit: Payer: Self-pay

## 2019-06-12 ENCOUNTER — Encounter: Payer: Self-pay | Admitting: Internal Medicine

## 2019-06-12 DIAGNOSIS — L259 Unspecified contact dermatitis, unspecified cause: Secondary | ICD-10-CM | POA: Insufficient documentation

## 2019-06-12 DIAGNOSIS — L239 Allergic contact dermatitis, unspecified cause: Secondary | ICD-10-CM

## 2019-06-12 NOTE — Assessment & Plan Note (Signed)
Patient presenting with a four day history of pleuritic lesions on her right forearm. She states that they popped up on Saturday. There extremely itchy. She presented to the emergency department was told that they could be bedbugs versus a contact dermatitis. She was given a steroid cream however she is not tried this. She has been using over-the-counter Benadryl cream which significantly helps with the itching. She is not noticed any spread. She has not noticed any vesicles or purulent discharge. She has had bedbugs in the past and does not feel this is consistent with prior. She is not been out in the woods. She is not traveled recently. She has had no systemic signs of infection.  A/P: -this is likely a contact dermatitis. She should be okay to use the triamcinolone cream. She will call back if her rash begins to spread or gets worse.

## 2019-06-12 NOTE — Patient Instructions (Signed)
Thank you for allowing [place] to provide your care. I think that you are likely suffering from what is called contact dermatitis. It is okay to use the steroid cream your prescribed at the emergency department. If you feel your rash is spreading are getting worse please come back to see [place].
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
## 2019-06-13 NOTE — Progress Notes (Signed)
Internal Medicine Clinic Attending  Case discussed with Dr. Helberg at the time of the visit.  We reviewed the resident's history and exam and pertinent patient test results.  I agree with the assessment, diagnosis, and plan of care documented in the resident's note.    

## 2019-08-06 ENCOUNTER — Telehealth: Payer: Self-pay

## 2019-08-06 NOTE — Telephone Encounter (Signed)
The patient stated she would like a refill on the medication for itching. She thinks that is the Triamcinolone medication recently prescribed to her.

## 2019-08-14 ENCOUNTER — Other Ambulatory Visit (HOSPITAL_COMMUNITY)
Admission: RE | Admit: 2019-08-14 | Discharge: 2019-08-14 | Disposition: A | Discharge status: Home / Self Care | Payer: Medicaid Other | Source: Ambulatory Visit | Attending: Family Medicine | Admitting: Family Medicine

## 2019-08-14 ENCOUNTER — Ambulatory Visit (INDEPENDENT_AMBULATORY_CARE_PROVIDER_SITE_OTHER): Payer: Medicaid Other

## 2019-08-14 ENCOUNTER — Other Ambulatory Visit: Payer: Self-pay

## 2019-08-14 ENCOUNTER — Other Ambulatory Visit: Payer: Self-pay | Admitting: Internal Medicine

## 2019-08-14 DIAGNOSIS — N898 Other specified noninflammatory disorders of vagina: Secondary | ICD-10-CM | POA: Insufficient documentation

## 2019-08-14 DIAGNOSIS — R7303 Prediabetes: Secondary | ICD-10-CM

## 2019-08-14 MED ORDER — CLOBETASOL PROPIONATE 0.05 % EX CREA: 1.0000 "application " | TOPICAL_CREAM | Freq: Two times a day (BID) | CUTANEOUS | 3 refills | Status: DC

## 2019-08-14 NOTE — Progress Notes (Signed)
Pt here today with c/o vaginal itching with no discharge.  Pt requests to have her clobetasol cream refilled because it is effective with tx of her vaginal itching.  Per Dr. Ilda Basset pt can have medication refilled.  Pt explained on how to obtain self swab and that we will call with abnormal results.  Pt verbalized understanding.  Mel Almond, RN 08/14/19

## 2019-08-15 ENCOUNTER — Other Ambulatory Visit: Payer: Self-pay | Admitting: Obstetrics and Gynecology

## 2019-08-15 LAB — CERVICOVAGINAL ANCILLARY ONLY
Bacterial Vaginitis (gardnerella): POSITIVE — AB
Candida Glabrata: NEGATIVE
Candida Vaginitis: NEGATIVE
Chlamydia: NEGATIVE
Comment: NEGATIVE
Comment: NEGATIVE
Comment: NEGATIVE
Comment: NEGATIVE
Comment: NEGATIVE
Comment: NORMAL
Neisseria Gonorrhea: NEGATIVE
Trichomonas: NEGATIVE

## 2019-08-15 MED ORDER — METRONIDAZOLE 500 MG PO TABS: 500.0000 mg | ORAL_TABLET | Freq: Two times a day (BID) | ORAL | 1 refills | Status: DC

## 2019-08-15 NOTE — Progress Notes (Signed)
Patient seen and assessed by nursing staff during this encounter. I have reviewed the chart and agree with the documentation and plan.  Aletha Halim, MD 08/15/2019 3:13 PM

## 2019-09-18 ENCOUNTER — Other Ambulatory Visit: Payer: Self-pay | Admitting: Obstetrics and Gynecology

## 2019-09-26 ENCOUNTER — Encounter: Payer: Self-pay | Admitting: Internal Medicine

## 2019-09-26 ENCOUNTER — Ambulatory Visit (INDEPENDENT_AMBULATORY_CARE_PROVIDER_SITE_OTHER): Payer: Medicaid Other | Admitting: Internal Medicine

## 2019-09-26 VITALS — BP 112/69 | HR 79 | Temp 98.1°F | Ht 66.0 in | Wt 326.0 lb

## 2019-09-26 DIAGNOSIS — R252 Cramp and spasm: Secondary | ICD-10-CM | POA: Diagnosis not present
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
## 2019-09-26 NOTE — Patient Instructions (Signed)
[name], please increase your water intake especially now that the weather is getting warmer.  I will call you with your lab results and will add supplements if necessary.  Please return in one month to follow up with your pcp

## 2019-09-26 NOTE — Assessment & Plan Note (Signed)
Bilateral lower extremity cramping pains for the last few weeks but intermittently happening for the last several years.  Like a "charlie horse" tightness in both legs that worsens when laying down and is improved when getting up and walking or stretching the legs.  Mustard relieves the pain.  She does not feel an actual "knot".  She feels this may have worsened her leg cramps.  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  The majority of her symptoms occur at night when laying down.  She drinks coffee in the morning, iced tea, sodas and water only with her meds.  She donates plasma twice weekly.  She reports eating more salads she has lost 13lbs since last year.  Cutting out fast foods and greasy foods.  More vegetables in her diet.  She is on a thiazide diuretic but I see no past evidence of hypokalemia.  She has had prior workup and TSH and B12 was normal.  Symptoms seem to be associated with warmer months.    -she will increase her water intake significantly  -check bmp, mag, phos

## 2019-09-27 LAB — BMP8+ANION GAP
Anion Gap: 13 mmol/L (ref 10.0–18.0)
BUN/Creatinine Ratio: 14 (ref 9–23)
BUN: 11 mg/dL (ref 6–24)
CO2: 24 mmol/L (ref 20–29)
Calcium: 8.9 mg/dL (ref 8.7–10.2)
Chloride: 106 mmol/L (ref 96–106)
Creatinine, Ser: 0.78 mg/dL (ref 0.57–1.00)
GFR calc Af Amer: 97 mL/min/{1.73_m2} (ref >59)
GFR calc non Af Amer: 84 mL/min/{1.73_m2} (ref >59)
Glucose: 95 mg/dL (ref 65–99)
Potassium: 4 mmol/L (ref 3.5–5.2)
Sodium: 143 mmol/L (ref 134–144)

## 2019-09-27 LAB — MAGNESIUM: Magnesium: 1.9 mg/dL (ref 1.6–2.3)

## 2019-09-27 LAB — PHOSPHORUS: Phosphorus: 3.4 mg/dL (ref 3.0–4.3)

## 2019-09-30 ENCOUNTER — Encounter: Payer: Self-pay | Admitting: *Deleted

## 2019-10-01 ENCOUNTER — Encounter: Payer: Self-pay | Admitting: Internal Medicine

## 2019-10-02 NOTE — Progress Notes (Signed)
Internal Medicine Clinic Attending  Case discussed with Dr. Winfrey  at the time of the visit.  We reviewed the resident's history and exam and pertinent patient test results.  I agree with the assessment, diagnosis, and plan of care documented in the resident's note.  

## 2019-10-28 ENCOUNTER — Encounter: Payer: Medicaid Other | Admitting: Internal Medicine

## 2019-11-11 ENCOUNTER — Other Ambulatory Visit: Payer: Self-pay | Admitting: Internal Medicine

## 2019-11-12 ENCOUNTER — Other Ambulatory Visit: Payer: Self-pay | Admitting: Internal Medicine

## 2019-11-12 DIAGNOSIS — E785 Hyperlipidemia, unspecified: Secondary | ICD-10-CM

## 2019-11-12 NOTE — Telephone Encounter (Signed)
Refill is appropriate. I'm ok with year long refills. Please send to attending pool for approval. I'm unable to refill prescriptions for Medicare and Medicaid patients due to issues with my NPI number.

## 2019-11-18 ENCOUNTER — Ambulatory Visit (INDEPENDENT_AMBULATORY_CARE_PROVIDER_SITE_OTHER): Payer: Medicaid Other | Admitting: Internal Medicine

## 2019-11-18 ENCOUNTER — Encounter: Payer: Medicaid Other | Admitting: Internal Medicine

## 2019-11-18 ENCOUNTER — Ambulatory Visit (HOSPITAL_COMMUNITY)
Admission: RE | Admit: 2019-11-18 | Discharge: 2019-11-18 | Disposition: A | Discharge status: Home / Self Care | Payer: Medicaid Other | Source: Ambulatory Visit | Attending: Internal Medicine | Admitting: Internal Medicine

## 2019-11-18 ENCOUNTER — Other Ambulatory Visit: Payer: Self-pay

## 2019-11-18 ENCOUNTER — Telehealth: Payer: Self-pay | Admitting: *Deleted

## 2019-11-18 ENCOUNTER — Other Ambulatory Visit: Payer: Self-pay | Admitting: Internal Medicine

## 2019-11-18 ENCOUNTER — Encounter: Payer: Self-pay | Admitting: Internal Medicine

## 2019-11-18 VITALS — BP 109/63 | HR 60 | Temp 98.0°F | Ht 66.0 in | Wt 330.2 lb

## 2019-11-18 DIAGNOSIS — M25552 Pain in left hip: Secondary | ICD-10-CM | POA: Insufficient documentation

## 2019-11-18 DIAGNOSIS — R7303 Prediabetes: Secondary | ICD-10-CM

## 2019-11-18 DIAGNOSIS — Z6841 Body Mass Index (BMI) 40.0 and over, adult: Secondary | ICD-10-CM | POA: Diagnosis not present

## 2019-11-18 DIAGNOSIS — M25551 Pain in right hip: Secondary | ICD-10-CM | POA: Insufficient documentation

## 2019-11-18 DIAGNOSIS — M1612 Unilateral primary osteoarthritis, left hip: Secondary | ICD-10-CM | POA: Diagnosis not present

## 2019-11-18 DIAGNOSIS — M1611 Unilateral primary osteoarthritis, right hip: Secondary | ICD-10-CM | POA: Diagnosis not present

## 2019-11-18 LAB — POCT GLYCOSYLATED HEMOGLOBIN (HGB A1C): Hemoglobin A1C: 5.5 % (ref 4.0–5.6)

## 2019-11-18 LAB — GLUCOSE, CAPILLARY: Glucose-Capillary: 75 mg/dL (ref 70–99)

## 2019-11-18 MED ORDER — NAPROXEN 250 MG PO TABS
250.0000 mg | ORAL_TABLET | Freq: Two times a day (BID) | ORAL | 1 refills | Status: DC
Start: 2019-11-18 — End: 2019-11-18

## 2019-11-18 MED ORDER — SEMAGLUTIDE(0.25 OR 0.5MG/DOS) 2 MG/1.5ML ~~LOC~~ SOPN: PEN_INJECTOR | SUBCUTANEOUS | 0 refills | Status: DC

## 2019-11-18 MED ORDER — NAPROXEN 250 MG PO TABS: 250.0000 mg | ORAL_TABLET | Freq: Two times a day (BID) | ORAL | 1 refills | Status: DC

## 2019-11-18 NOTE — Telephone Encounter (Signed)
ozempic needs PA

## 2019-11-18 NOTE — Assessment & Plan Note (Signed)
Patient presents for evaluation of bilateral hip pain that has been present for 3 years but has now exacerbated in the past 2 to 3 months.  States her right hip hurts worse than the left hip and that she has started to experience more pain ever since she started working.  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  Movement exacerbates pain and resting alleviates it.  She has also been taking  Mustard every day which helps with the pain. I discussed with her that this is likely OA due to age and obesity and recommended NSAIDs and PT. She stated she would feel more comfortable with imaging and a sports medicine referral.   - Naproxen 250-500 mg q12h for pain  - Referral to PT  - XR of bilateral hips  - Referral to sports medicine for steroid injection

## 2019-11-18 NOTE — Assessment & Plan Note (Signed)
Patient at high risk of DM due to obesity, race, and family history. A1c today at 5.5. Will continue to monitor.

## 2019-11-18 NOTE — Assessment & Plan Note (Signed)
Patient presents today asking about semaglutide for weight loss. She has tried to lose weight for the past 2 years without success. She can usually adhere to a healthy diet, but has difficulty exercising due to hip pain and at times lack of motivation. She also does not feel safe walking in her neighborhood. We discussed weekly semaglutide injections to help with weight loss but I explained to her that she will also need to eat healthy and exercise while on this medication. Otherwise she may not experience the maximum benefit of the medicine. Side effects were discussed. Will start at 0.25 weekly and uptitrate q4 weeks. She will follow up through telehealth appt in 4 weeks.

## 2019-11-18 NOTE — Patient Instructions (Addendum)
[name],   I ordered x-rays for your hips. We will call you with the results. I also made a referral to physical therapy and sport medicine. For the pain, take naproxen 250 mg (1-2 tablets) twice a day.   For weight loss, please use semaglutide 0.25 weekly. Ask the pharmacy to teach you how to inject the medication. Make a follow up appointment with [place] through the phone to make sure the medicine is working.   - Dr. Frederico Hamman
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
## 2019-11-19 ENCOUNTER — Telehealth: Payer: Self-pay | Admitting: *Deleted

## 2019-11-19 NOTE — Progress Notes (Signed)
Internal Medicine Clinic Attending  Case discussed with Dr. Santos-Sanchez at the time of the visit.  We reviewed the resident's history and exam and pertinent patient test results.  I agree with the assessment, diagnosis, and plan of care documented in the resident's note.    

## 2019-11-19 NOTE — Telephone Encounter (Signed)
Information to [employer] for PA for [employer].  Patient will need to try and fail Bydureon, Byetta, Trulicity or Victoza.  Message to be sent to Dr. Isac Sarna to consider a change.  Sander Nephew, RN 11/19/2019 9:42 AM

## 2019-12-01 NOTE — Telephone Encounter (Signed)
Called patient x2 to discuss possible change in medication. No answer. Will need to discuss when she calls back.

## 2019-12-11 ENCOUNTER — Other Ambulatory Visit: Payer: Self-pay | Admitting: Internal Medicine

## 2019-12-11 DIAGNOSIS — Z1231 Encounter for screening mammogram for malignant neoplasm of breast: Secondary | ICD-10-CM

## 2019-12-12 ENCOUNTER — Ambulatory Visit: Payer: Medicaid Other | Attending: Internal Medicine | Admitting: Physical Therapy

## 2019-12-12 ENCOUNTER — Encounter: Payer: Self-pay | Admitting: Physical Therapy

## 2019-12-12 ENCOUNTER — Other Ambulatory Visit: Payer: Self-pay

## 2019-12-12 ENCOUNTER — Ambulatory Visit (INDEPENDENT_AMBULATORY_CARE_PROVIDER_SITE_OTHER): Payer: Medicaid Other | Admitting: Sports Medicine

## 2019-12-12 VITALS — BP 127/74 | Ht 66.0 in | Wt 334.0 lb

## 2019-12-12 DIAGNOSIS — R2689 Other abnormalities of gait and mobility: Secondary | ICD-10-CM | POA: Diagnosis not present

## 2019-12-12 DIAGNOSIS — M545 Low back pain: Secondary | ICD-10-CM | POA: Insufficient documentation

## 2019-12-12 DIAGNOSIS — M6281 Muscle weakness (generalized): Secondary | ICD-10-CM | POA: Insufficient documentation

## 2019-12-12 DIAGNOSIS — G8929 Other chronic pain: Secondary | ICD-10-CM | POA: Diagnosis not present

## 2019-12-12 DIAGNOSIS — M25552 Pain in left hip: Secondary | ICD-10-CM | POA: Insufficient documentation

## 2019-12-12 DIAGNOSIS — M25561 Pain in right knee: Secondary | ICD-10-CM | POA: Diagnosis not present

## 2019-12-12 DIAGNOSIS — M25551 Pain in right hip: Secondary | ICD-10-CM

## 2019-12-12 NOTE — Patient Instructions (Signed)
Access Code: [email or web address] URL: [email or web address] Date: 12/12/2019 Prepared by: Hilda Blades  Exercises Clamshell - 2 x daily - 7 x weekly - 15 reps Supine Lower Trunk Rotation - 2 x daily - 7 x weekly - 10 reps - 3-5 seconds hold Supine March - 2 x daily - 7 x weekly - 10 reps Supine Active Straight Leg Raise - 2 x daily - 7 x weekly - 10 reps

## 2019-12-13 ENCOUNTER — Encounter: Payer: Self-pay | Admitting: Physical Therapy
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
## 2019-12-13 NOTE — Progress Notes (Signed)
   Subjective:    Patient ID: Rebekah Howard, female    DOB: 10-16-1960, 59 y.o.   MRN: [id number]  HPI chief complaint: Bilateral hip pain  59 year old female comes in today complaining of bilateral hip pain that has been present for several years.  She states the pain begins in the low back and will radiate into the posterior aspect of both hips.  Pain will then radiate down both legs and occasionally into both ankles and feet.  She also notices swelling in her right ankle and foot.  Her pain is worse with prolonged standing and with bending forward.  Some pain at rest.  No numbness or tingling.  Her primary care physician has placed her on naproxen sodium.  X-rays of her hips were done on November 19, 2019.  No prior hip or low back surgery.  No recent trauma.  Past medical history reviewed Medications reviewed Allergies reviewed    Review of Systems    As above Objective:   Physical Exam  Well-developed, well-nourished.  No acute distress.  Awake alert and oriented x3.  Vital signs reviewed  Lumbar spine: Diffuse tenderness to palpation but nothing focal.  No spasm.  Limited range of motion secondary to pain.  Examination of each hip show smooth painless hip range of motion.  Negative logroll.  Diffuse tenderness to palpation.  No erythema.  No obvious soft tissue swelling.  Neurological exam shows no gross neurological deficit.  Examination of the right foot and ankle does show 1+ pitting edema  X-rays of her hips from November 19, 2019 are reviewed.  Although she has some mild degenerative changes in both hips it is not severe.  She does have degenerative changes in the lower lumbar spine.      Assessment & Plan:   Bilateral hip pain likely referred pain from her lumbar spine  I would like to start with getting an AP and lateral of her lumbar spine.  Although she does have some arthritis in her hips her physical exam and history does not support that as her main pain  generator.  Phone follow-up with those lumbar spine x-ray results when available.  I will likely recommend physical therapy as first-line of treatment.  In talking with the patient today it sounds like she may already have an order in for this but I will clarify that when I talk with her about her x-ray results.  She may continue on her naproxen sodium as needed for pain as well.

## 2019-12-20 ENCOUNTER — Encounter: Payer: Self-pay | Admitting: Student

## 2019-12-20 ENCOUNTER — Ambulatory Visit (INDEPENDENT_AMBULATORY_CARE_PROVIDER_SITE_OTHER): Payer: Medicaid Other | Admitting: Student

## 2019-12-20 ENCOUNTER — Other Ambulatory Visit: Payer: Self-pay | Admitting: Internal Medicine

## 2019-12-20 ENCOUNTER — Ambulatory Visit (HOSPITAL_COMMUNITY)
Admission: RE | Admit: 2019-12-20 | Discharge: 2019-12-20 | Disposition: A | Discharge status: Home / Self Care | Payer: Medicaid Other | Source: Ambulatory Visit | Attending: Sports Medicine | Admitting: Sports Medicine

## 2019-12-20 VITALS — BP 117/88 | HR 68 | Temp 98.0°F | Ht 66.0 in | Wt 333.3 lb

## 2019-12-20 DIAGNOSIS — M25571 Pain in right ankle and joints of right foot: Secondary | ICD-10-CM | POA: Insufficient documentation

## 2019-12-20 DIAGNOSIS — L259 Unspecified contact dermatitis, unspecified cause: Secondary | ICD-10-CM | POA: Diagnosis not present

## 2019-12-20 DIAGNOSIS — I10 Essential (primary) hypertension: Secondary | ICD-10-CM | POA: Diagnosis not present

## 2019-12-20 DIAGNOSIS — M25552 Pain in left hip: Secondary | ICD-10-CM | POA: Diagnosis not present

## 2019-12-20 DIAGNOSIS — Z6841 Body Mass Index (BMI) 40.0 and over, adult: Secondary | ICD-10-CM

## 2019-12-20 DIAGNOSIS — R252 Cramp and spasm: Secondary | ICD-10-CM

## 2019-12-20 DIAGNOSIS — R7303 Prediabetes: Secondary | ICD-10-CM

## 2019-12-20 DIAGNOSIS — G8929 Other chronic pain: Secondary | ICD-10-CM

## 2019-12-20 DIAGNOSIS — M25551 Pain in right hip: Secondary | ICD-10-CM | POA: Diagnosis not present

## 2019-12-20 DIAGNOSIS — M48061 Spinal stenosis, lumbar region without neurogenic claudication: Secondary | ICD-10-CM | POA: Diagnosis not present

## 2019-12-20 DIAGNOSIS — E785 Hyperlipidemia, unspecified: Secondary | ICD-10-CM

## 2019-12-20 DIAGNOSIS — F418 Other specified anxiety disorders: Secondary | ICD-10-CM | POA: Diagnosis not present

## 2019-12-20 DIAGNOSIS — L299 Pruritus, unspecified: Secondary | ICD-10-CM

## 2019-12-20 DIAGNOSIS — M5442 Lumbago with sciatica, left side: Secondary | ICD-10-CM

## 2019-12-20 DIAGNOSIS — M5136 Other intervertebral disc degeneration, lumbar region: Secondary | ICD-10-CM | POA: Diagnosis not present

## 2019-12-20 MED ORDER — CLOBETASOL PROPIONATE 0.05 % EX CREA: 1.0000 "application " | TOPICAL_CREAM | Freq: Two times a day (BID) | CUTANEOUS | 3 refills | Status: DC

## 2019-12-20 MED ORDER — DULOXETINE HCL 30 MG PO CPEP: 30.0000 mg | ORAL_CAPSULE | Freq: Every day | ORAL | 0 refills | Status: DC

## 2019-12-20 MED ORDER — NYSTATIN 100000 UNIT/GM EX POWD: CUTANEOUS | 3 refills | Status: DC

## 2019-12-20 MED ORDER — SEMAGLUTIDE(0.25 OR 0.5MG/DOS) 2 MG/1.5ML ~~LOC~~ SOPN: PEN_INJECTOR | SUBCUTANEOUS | 0 refills | Status: AC

## 2019-12-20 NOTE — Patient Instructions (Addendum)
Today, we discussed that we are going to discontinue your Naproxen and start Duloxetine, which you should take once daily for your pain.  I have prescribed your semaglutide injections for weight loss.  We will check your cholesterol levels and your electrolytes today.  I have sent a referral for you to get a doppler ultrasound of your right lower extremity to rule out blood clot.  You should receive a call to schedule in the near future.  Please feel free to call with any questions or concerns at [phone].  Please return to see me in clinic within 2 weeks, before the end of July.  Thank you,  Dr. Jeralyn Bennett   Deep Vein Thrombosis  Deep vein thrombosis (DVT) is a condition in which a blood clot forms in a deep vein, such as a lower leg, thigh, or arm vein. A clot is blood that has thickened into a gel or solid. This condition is dangerous. It can lead to serious and even life-threatening complications if the clot travels to the lungs and causes a blockage (pulmonary embolism). It can also damage veins in the leg. This can result in leg pain, swelling, discoloration, and sores (post-thrombotic syndrome). What are the causes? This condition may be caused by:  A slowdown of blood flow.  Damage to a vein.  A condition that causes blood to clot more easily, such as an inherited clotting disorder. What increases the risk? The following factors may make you more likely to develop this condition:  Being overweight.  Being older, especially over age 74.  Sitting or lying down for more than four hours.  Being in the hospital.  Lack of physical activity (sedentary lifestyle).  Pregnancy, being in childbirth, or having recently given birth.  Taking medicines that contain estrogen, such as medicines to prevent pregnancy.  Smoking.  A history of any of the following: ? Blood clots or a blood clotting disease. ? Peripheral vascular disease. ? Inflammatory bowel  disease. ? Cancer. ? Heart disease. ? Genetic conditions that affect how your blood clots, such as Factor V Leiden mutation. ? Neurological diseases that affect your legs (leg paresis). ? A recent injury, such as a car accident. ? Major or lengthy surgery. ? A central line placed inside a large vein. What are the signs or symptoms? Symptoms of this condition include:  Swelling, pain, or tenderness in an arm or leg.  Warmth, redness, or discoloration in an arm or leg. If the clot is in your leg, symptoms may be more noticeable or worse when you stand or walk. Some people may not develop any symptoms. How is this diagnosed? This condition is diagnosed with:  A medical history and physical exam.  Tests, such as: ? Blood tests. These are done to check how well your blood clots. ? Ultrasound. This is done to check for clots. ? Venogram. For this test, contrast dye is injected into a vein and X-rays are taken to check for any clots. How is this treated? Treatment for this condition depends on:  The cause of your DVT.  Your risk for bleeding or developing more clots.  Any other medical conditions that you have. Treatment may include:  Taking a blood thinner (anticoagulant). This type of medicine prevents clots from forming. It may be taken by mouth, injected under the skin, or injected through an IV (catheter).  Injecting clot-dissolving medicines into the affected vein (catheter-directed thrombolysis).  Having surgery. Surgery may be done to: ? Remove the clot. ?  Place a filter in a large vein to catch blood clots before they reach the lungs. Some treatments may be continued for up to six months. Follow these instructions at home: If you are taking blood thinners:  Take the medicine exactly as told by your health care provider. Some blood thinners need to be taken at the same time every day. Do not skip a dose.  Talk with your health care provider before you take any  medicines that contain aspirin or NSAIDs. These medicines increase your risk for dangerous bleeding.  Ask your health care provider about foods and drugs that could change the way the medicine works (may interact). Avoid those things if your health care provider tells you to do so.  Blood thinners can cause easy bruising and may make it difficult to stop bleeding. Because of this: ? Be very careful when using knives, scissors, or other sharp objects. ? Use an electric razor instead of a blade. ? Avoid activities that could cause injury or bruising, and follow instructions about how to prevent falls.  Wear a medical alert bracelet or carry a card that lists what medicines you take. General instructions  Take over-the-counter and prescription medicines only as told by your health care provider.  Return to your normal activities as told by your health care provider. Ask your health care provider what activities are safe for you.  Wear compression stockings if recommended by your health care provider.  Keep all follow-up visits as told by your health care provider. This is important. How is this prevented? To lower your risk of developing this condition again:  For 30 or more minutes every day, do an activity that: ? Involves moving your arms and legs. ? Increases your heart rate.  When traveling for longer than four hours: ? Exercise your arms and legs every hour. ? Drink plenty of water. ? Avoid drinking alcohol.  Avoid sitting or lying for a long time without moving your legs.  If you have surgery or you are hospitalized, ask about ways to prevent blood clots. These may include taking frequent walks or using anticoagulants.  Stay at a healthy weight.  If you are a woman who is older than age 87, avoid unnecessary use of medicines that contain estrogen, such as some birth control pills.  Do not use any products that contain nicotine or tobacco, such as cigarettes and  e-cigarettes. This is especially important if you take estrogen medicines. If you need help quitting, ask your health care provider. Contact a health care provider if:  You miss a dose of your blood thinner.  Your menstrual period is heavier than usual.  You have unusual bruising. Get help right away if:  You have: ? New or increased pain, swelling, or redness in an arm or leg. ? Numbness or tingling in an arm or leg. ? Shortness of breath. ? Chest pain. ? A rapid or irregular heartbeat. ? A severe headache or confusion. ? A cut that will not stop bleeding.  There is blood in your vomit, stool, or urine.  You have a serious fall or accident, or you hit your head.  You feel light-headed or dizzy.  You cough up blood. These symptoms may represent a serious problem that is an emergency. Do not wait to see if the symptoms will go away. Get medical help right away. Call your local emergency services (911 in the [place] Do not drive yourself to the hospital. Summary  Deep vein thrombosis (DVT)  is a condition in which a blood clot forms in a deep vein, such as a lower leg, thigh, or arm vein.  Symptoms can include swelling, warmth, pain, and redness in your leg or arm.  This condition may be treated with a blood thinner (anticoagulant medicine), medicine that is injected to dissolve blood clots,compression stockings, or surgery.  If you are prescribed blood thinners, take them exactly as told. This information is not intended to replace advice given to you by your health care provider. Make sure you discuss any questions you have with your health care provider. Document Revised: 05/05/2017 Document Reviewed: 10/21/2016 Elsevier Patient Education  [place].
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
## 2019-12-20 NOTE — Assessment & Plan Note (Signed)
A: When questioned today, patient denies any history of depression or anxiety.  Last assessment and plan 07/19/17 state that patient had been following with OP psychiatry in the past on [place] and topiramate, but had stopped taking these medications in 2019.  Today, she does say she had a period of depressed mood following the death of her mother but denies any other depressive symptoms or SI recently.  P: Will start Duloxetine for patient's chronic back pain with symptoms of radiculopathy which may secondarily help patient's mood.  Given no symptoms recently and patient preference, will not pursue any counseling at this time.

## 2019-12-20 NOTE — Assessment & Plan Note (Addendum)
A: Patient presents with right ankle pain and swelling that has progressed over the past 2 months. Standing exacerbates her pain. She also notes cramping of her right lower extremity but denies weakness or numbness.  She notes her right ankle appears darker in color than her left.  She does note a recent drive from [facility] to [facility] prior to her symptom onset.  Most likely due to osteoarthritis given OA in other joints and BMI of 53.80, although may also be secondary to DVT vs. Chronic venous stasis.  P: Referred patient for doppler ultrasound of the RLE. Will follow-up results.   Addendum 12/25/19 - venous doppler studies negative for DVT. Patient informed of results.

## 2019-12-20 NOTE — Assessment & Plan Note (Signed)
A: Patient's HbA1c was 5.9 on 07/19/17; however, most recent HbA1c 11/18/19 was 5.5. HbA1c has been under 5.7 for over 2 years. Patient has tolerated Metformin well without diarrhea or other side effects.   P: Given history of HTN and hyperlipidemia as well as morbid obesity, will continue Metformin 500mg  XR daily. May consider stopping metformin at future visit if A1c's continue to be in normal range.

## 2019-12-20 NOTE — Assessment & Plan Note (Signed)
A: Patient endorses chronic dermatitis associated with itching. She states she has used clobetasol cream in the past with success and is not currently experiencing symptoms, but wishes to have a refill for future episodes.  P: Refilled clobetasol prescription.

## 2019-12-20 NOTE — Assessment & Plan Note (Signed)
A: Current BMI is 53.80, up 3 lbs since visit last month.  She states that she eats a lot of sugary foods and says that she has been unable to exercise as much as she would like secondary to chronic back/knee pain. She was prescribed semaglutide last month but states she never received the prescription.  P: Will prescribe semaglutide 0.25 MG once per week for 30 days followed by 0.5 MG once per week for 30 days. Will monitor patient's response. Encouraged portion control and avoidance of simple sugars. Encouraged exercise as tolerated.

## 2019-12-20 NOTE — Assessment & Plan Note (Signed)
A: Blood pressure today is 117/88. BP Readings from Last 3 Encounters:  12/20/19 117/88  12/12/19 127/74  11/18/19 109/63  Blood pressure has been under very good control with lisinopril-HCTZ 20-25MG  daily.  P: Continue current medication. Patient may come off this medication in the future if blood pressure and blood sugars remain under great control.

## 2019-12-20 NOTE — Assessment & Plan Note (Signed)
A: LDL on 01/23/17 was 129. Last lipid panel 10/25/17 within normal limits. Patient is taking atorvastatin 40mg  daily and complains of muscle cramping, but denies muscle pain or weakness making myositis unlikely.   P: Repeat lipid panel today and reassess next visit.

## 2019-12-21 LAB — BMP8+ANION GAP
Anion Gap: 16 mmol/L (ref 10.0–18.0)
BUN/Creatinine Ratio: 17 (ref 9–23)
BUN: 13 mg/dL (ref 6–24)
CO2: 23 mmol/L (ref 20–29)
Calcium: 9.3 mg/dL (ref 8.7–10.2)
Chloride: 103 mmol/L (ref 96–106)
Creatinine, Ser: 0.78 mg/dL (ref 0.57–1.00)
GFR calc Af Amer: 97 mL/min/{1.73_m2} (ref >59)
GFR calc non Af Amer: 84 mL/min/{1.73_m2} (ref >59)
Glucose: 80 mg/dL (ref 65–99)
Potassium: 4 mmol/L (ref 3.5–5.2)
Sodium: 142 mmol/L (ref 134–144)

## 2019-12-21 LAB — LIPID PANEL
Chol/HDL Ratio: 3.5 ratio (ref 0.0–4.4)
Cholesterol, Total: 159 mg/dL (ref 100–199)
HDL: 46 mg/dL (ref >39)
LDL Chol Calc (NIH): 85 mg/dL (ref 0–99)
Triglycerides: 165 mg/dL — ABNORMAL HIGH (ref 0–149)
VLDL Cholesterol Cal: 28 mg/dL (ref 5–40)

## 2019-12-23 ENCOUNTER — Telehealth: Payer: Self-pay | Admitting: Student

## 2019-12-23 NOTE — Progress Notes (Signed)
Internal Medicine Clinic Attending  I saw and evaluated the patient.  I personally confirmed the key portions of the history and exam documented by Dr. Speakman and I reviewed pertinent patient test results.  The assessment, diagnosis, and plan were formulated together and I agree with the documentation in the resident's note.  

## 2019-12-23 NOTE — Telephone Encounter (Signed)
Pt is requesting test results and handicap placard. Stated she will come to pick up handicap form when completed by the doctor. Thanks

## 2019-12-23 NOTE — Telephone Encounter (Signed)
Pls contact pt regarding results [phone]

## 2019-12-23 NOTE — Telephone Encounter (Signed)
Return call to pt- no answer 

## 2019-12-24 ENCOUNTER — Other Ambulatory Visit: Payer: Self-pay

## 2019-12-24 ENCOUNTER — Telehealth: Payer: Self-pay | Admitting: Student

## 2019-12-24 ENCOUNTER — Ambulatory Visit (HOSPITAL_COMMUNITY)
Admission: RE | Admit: 2019-12-24 | Discharge: 2019-12-24 | Disposition: A | Discharge status: Home / Self Care | Payer: Medicaid Other | Source: Ambulatory Visit | Attending: Internal Medicine | Admitting: Internal Medicine

## 2019-12-24 ENCOUNTER — Telehealth: Payer: Self-pay | Admitting: *Deleted

## 2019-12-24 DIAGNOSIS — R252 Cramp and spasm: Secondary | ICD-10-CM

## 2019-12-24 NOTE — Telephone Encounter (Signed)
Spoke with patient, informing her that her Duloxetine is ready for pickup at her pharmacy but her semaglutide requires prior authorization before she can pick it up. Patient receives notifications when her medications are ready and will pick it up after we are able to send it to her pharmacy.  Jeralyn Bennett, PGY1 [facility] Pager [phone]

## 2019-12-24 NOTE — Telephone Encounter (Signed)
Call from [facility] to give report - Right leg negative for DVT. Report is in [place].

## 2019-12-24 NOTE — Telephone Encounter (Signed)
Per pharmacy after speaking to pt Needs PA on semaglutide/ozempic

## 2019-12-24 NOTE — Telephone Encounter (Signed)
Requesting to speak with a nurse about meds, please call pt back.  

## 2019-12-24 NOTE — Progress Notes (Signed)
Right lower extremity venous duplex has been completed. Preliminary results can be found in CV Proc through chart review.  Results were given to [facility] at Dr. Lenis Noon office.  12/24/19 2:34 PM Rebekah Howard RVT

## 2019-12-25 NOTE — Telephone Encounter (Signed)
Form completed, pt aware.Rebekah Hidden Cassady7/21/202110:58 AM

## 2019-12-26 NOTE — Telephone Encounter (Signed)
  Notified via telephone of lumbar spine x-ray results.  She has multilevel degenerative disc disease.  I recommended physical therapy.  She may wean to home exercise program per the therapist discretion.  Follow-up with me as needed.

## 2019-12-27 ENCOUNTER — Ambulatory Visit: Payer: Medicaid Other | Admitting: Physical Therapy

## 2019-12-27 ENCOUNTER — Other Ambulatory Visit: Payer: Self-pay

## 2019-12-27 ENCOUNTER — Telehealth: Payer: Self-pay

## 2019-12-27 DIAGNOSIS — M25551 Pain in right hip: Secondary | ICD-10-CM | POA: Diagnosis not present

## 2019-12-27 DIAGNOSIS — M545 Low back pain, unspecified: Secondary | ICD-10-CM

## 2019-12-27 DIAGNOSIS — M25561 Pain in right knee: Secondary | ICD-10-CM | POA: Diagnosis not present

## 2019-12-27 DIAGNOSIS — M25552 Pain in left hip: Secondary | ICD-10-CM | POA: Diagnosis not present

## 2019-12-27 DIAGNOSIS — R2689 Other abnormalities of gait and mobility: Secondary | ICD-10-CM | POA: Diagnosis not present

## 2019-12-27 DIAGNOSIS — G8929 Other chronic pain: Secondary | ICD-10-CM

## 2019-12-27 DIAGNOSIS — M6281 Muscle weakness (generalized): Secondary | ICD-10-CM | POA: Diagnosis not present

## 2019-12-27 NOTE — Telephone Encounter (Signed)
Pt states she has started having numbness in the back of 1 leg from hip to heel, she thinks it is from medication. appt mon 7/26 at 1530

## 2019-12-27 NOTE — Telephone Encounter (Signed)
Requesting to speak with a nurse about numb on the heel. Please call pt back.
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
## 2019-12-30 ENCOUNTER — Ambulatory Visit (INDEPENDENT_AMBULATORY_CARE_PROVIDER_SITE_OTHER): Payer: Medicaid Other | Admitting: Student

## 2019-12-30 ENCOUNTER — Encounter: Payer: Self-pay | Admitting: Student

## 2019-12-30 VITALS — BP 141/77 | HR 64 | Temp 98.0°F | Ht 66.0 in | Wt 335.8 lb

## 2019-12-30 DIAGNOSIS — M5136 Other intervertebral disc degeneration, lumbar region: Secondary | ICD-10-CM

## 2019-12-30 DIAGNOSIS — Z6841 Body Mass Index (BMI) 40.0 and over, adult: Secondary | ICD-10-CM

## 2019-12-30 DIAGNOSIS — I10 Essential (primary) hypertension: Secondary | ICD-10-CM

## 2019-12-30 DIAGNOSIS — G8929 Other chronic pain: Secondary | ICD-10-CM

## 2019-12-30 DIAGNOSIS — F418 Other specified anxiety disorders: Secondary | ICD-10-CM | POA: Diagnosis not present

## 2019-12-30 MED ORDER — ACETAMINOPHEN 500 MG PO TABS: 1000.0000 mg | ORAL_TABLET | Freq: Three times a day (TID) | ORAL | 2 refills | Status: AC | PRN

## 2019-12-30 MED ORDER — NAPROXEN 500 MG PO TABS: 500.0000 mg | ORAL_TABLET | Freq: Two times a day (BID) | ORAL | 0 refills | Status: AC

## 2019-12-30 NOTE — Assessment & Plan Note (Signed)
A: Patient states that she took [place] in the past after suffering from depression when her mother died last year. She states that she is no longer taking this and that her mood has significantly improved since.   P: Patient has been started on Duloxetine 30mg  daily for radiculopathy pain. Will closely monitor her response and discontinue if intolerable symptoms emerge.

## 2019-12-30 NOTE — Patient Instructions (Addendum)
Today, we discussed that your right leg numbness is likely secondary to degenerative changes in your back.  Please stop taking ibuprofen 400mg  as needed and instead, start taking Naprosen 500mg  twice per day, and 1000mg  Tylenol three times per day. Please do not take more than 3000mg  Tylenol in one day.   We will plan to continue the 30mg  Duloxetine daily (in the morning) for the 14 day course of the medication. If you find this is helping your pain, please call before the prescription runs out for refills for 60mg  to take in the morning. If you notice side effects are troublesome, we can discontinue.   Please schedule a follow up appointment with me in 3 months.  Thank you,  Dr. Jeralyn Bennett   Degenerative Disk Disease  Degenerative disk disease is a condition caused by changes that occur in the spinal disks as a person ages. Spinal disks are soft and compressible disks located between the bones of your spine (vertebrae). These disks act like shock absorbers. Degenerative disk disease can affect the whole spine. However, the neck and lower back are most often affected. Many changes can occur in the spinal disks with aging, such as:  The spinal disks may dry and shrink.  Small tears may occur in the tough, outer covering of the disk (annulus).  The disk space may become smaller due to loss of water.  Abnormal growths in the bone (spurs) may occur. This can put pressure on the nerve roots exiting the spinal canal, causing pain.  The spinal canal may become narrowed. What are the causes? This condition may be caused by:  Normal degeneration with age.  Injuries.  Certain activities and sports that cause damage. What increases the risk? The following factors may make you more likely to develop this condition:  Being overweight.  Having a family history of degenerative disk disease.  Smoking.  Sudden injury.  Doing work that requires heavy lifting. What are the signs or  symptoms? Symptoms of this condition include:  Pain that varies in intensity. Some people have no pain, while others have severe pain. The location of the pain depends on the part of your backbone that is affected. You may have: ? Pain in your neck or arm if a disk in your neck area is affected. ? Pain in your back, buttocks, or legs if a disk in your lower back is affected.  Pain that becomes worse while bending or reaching up, or with twisting movements.  Pain that may start gradually and then get worse as time passes. It may also start after a major or minor injury.  Numbness or tingling in the arms or legs. How is this diagnosed? This condition may be diagnosed based on:  Your symptoms and medical history.  A physical exam.  Imaging tests, including: ? An X-ray of the spine. ? MRI. How is this treated? This condition may be treated with:  Medicines.  Rehabilitation exercises. These activities aim to strengthen muscles in your back and abdomen to better support your spine. If treatments do not help to relieve your symptoms or you have severe pain, you may need surgery. Follow these instructions at home: Medicines  Take over-the-counter and prescription medicines only as told by your health care provider.  Do not drive or use heavy machinery while taking prescription pain medicine.  If you are taking prescription pain medicine, take actions to prevent or treat constipation. Your health care provider may recommend that you: ? Drink enough fluid  to keep your urine pale yellow. ? Eat foods that are high in fiber, such as fresh fruits and vegetables, whole grains, and beans. ? Limit foods that are high in fat and processed sugars, such as fried or sweet foods. ? Take an over-the-counter or prescription medicine for constipation. Activity  Rest as told by your health care provider.  Ask your health care provider what activities are safe for you. Return to your normal  activities as directed.  Avoid sitting for a long time without moving. Get up to take short walks every 1-2 hours. This is important to improve blood flow and breathing. Ask for help if you feel weak or unsteady.  Perform relaxation exercises as told by your health care provider.  Maintain good posture.  Do not lift anything that is heavier than 10 lb (4.5 kg), or the limit that you are told, until your health care provider says that it is safe.  Follow proper lifting and walking techniques as told by your health care provider. Managing pain, stiffness, and swelling   If directed, put ice on the painful area. Icing can help to relieve pain. ? Put ice in a plastic bag. ? Place a towel between your skin and the bag. ? Leave the ice on for 20 minutes, 2-3 times a day.  If directed, apply heat to the painful area as often as told by your health care provider. Heat can reduce the stiffness of your muscles. Use the heat source that your health care provider recommends, such as a moist heat pack or a heating pad. ? Place a towel between your skin and the heat source. ? Leave the heat on for 20-30 minutes. ? Remove the heat if your skin turns bright red. This is especially important if you are unable to feel pain, heat, or cold. You may have a greater risk of getting burned. General instructions  Change your sitting, standing, and sleeping habits as told by your health care provider.  Avoid sitting in the same position for long periods of time. Change positions frequently.  Lose weight or maintain a healthy weight as told by your health care provider.  Do not use any products that contain nicotine or tobacco, such as cigarettes and e-cigarettes. If you need help quitting, ask your health care provider.  Wear supportive footwear.  Keep all follow-up visits as told by your health care provider. This is important. This may include visits for physical therapy. Contact a health care provider  if you:  Have pain that does not go away within 1-4 weeks.  Lose your appetite.  Lose weight without trying. Get help right away if you:  Have severe pain.  Notice weakness in your arms, hands, or legs.  Begin to lose control of your bladder or bowel movements.  Have fevers or night sweats. Summary  Degenerative disk disease is a condition caused by changes that occur in the spinal disks as a person ages.  Degenerative disk disease can affect the whole spine. However, the neck and lower back are most often affected.  Take over-the-counter and prescription medicines only as told by your health care provider. This information is not intended to replace advice given to you by your health care provider. Make sure you discuss any questions you have with your health care provider. Document Revised: 05/18/2017 Document Reviewed: 05/18/2017 Elsevier Patient Education  2020 [employer].

## 2019-12-30 NOTE — Assessment & Plan Note (Signed)
A: Patient's blood pressure elevated today: BP Readings from Last 3 Encounters:  12/30/19 (!) 141/77  12/20/19 117/88  12/12/19 127/74  on lisinopril-HCTZ 20-25mg , likely in the setting of acute pain.  P: Will continue current medications and reassess blood pressure at next visit when pain is hopefully better controlled.
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
## 2019-12-30 NOTE — Assessment & Plan Note (Addendum)
A: presenting with bilateral leg pain, R>L.  She notes that she has had chronic back pain for >20 years and has noticed a pins and needles sensation associated with muscle tightness and cramping down her right leg more so than her left for a few months. Her pain worsens with prolonged standing or exertion. She presented with these symptoms 12/20/19 and was prescribed Duloxetine 30mg  once daily. She states she started taking the medication 4 days ago and has had numbness in her left thigh since that time, which she attributes to the medication. She also notes mild fatigue and nausea. She does state that she has had mild weakness in her right lower extremity but denies falling. She states she has had chronic, mild urinary incontinence over the past year, but denies any new or worsening urinary incontinence or bowel incontinence. She does have occasional chills but no fevers. She has seen Dr. Micheline Chapman in sports medicine and continues to follow up with PT. She has been taking Ibuprofen 400mg  4x daily without relief of symptoms. 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 Pain likely due to radiculopathy from degenerative disc and facet disease noted on recent lumbar XR.   P: Stop taking ibuprofen 400mg  PRN.  Instead, start taking Naprosyn 500mg  BID and Tylenol 1000mg  TID for pain.  - Patient will continue Duloxetine 30mg  given no clinical numbness on examination and tolerable side effects. Explained it may take time for medication to have effect. Patient will give duloxetine 2 week trial and if no improvement or intolerable side effects, may discontinue.  - If Duloxetine improves symptoms within 2 weeks, should increase dose to 60mg . - encouraged weight loss and continued stretching with PT - Referred patient to [facility] for nutritional counseling.

## 2020-01-01 ENCOUNTER — Telehealth: Payer: Self-pay

## 2020-01-01 ENCOUNTER — Telehealth: Payer: Self-pay | Admitting: Student

## 2020-01-01 NOTE — Telephone Encounter (Signed)
Pt would like a not to be taken out of work, pls contact [phone]

## 2020-01-01 NOTE — Telephone Encounter (Signed)
Requesting to speak with Dr. Konrad Penta about [employer]. Please call pt back.

## 2020-01-01 NOTE — Telephone Encounter (Signed)
Return pt's call - stated she went to [facility] on [address]. Stated she was told her doctor needs to call to be sure it's ok for the pt to participate in their [place]. Contact person is Secretary/administrator of physical therapy phone [phone] fax# [phone]. Thanks

## 2020-01-01 NOTE — Progress Notes (Signed)
Internal Medicine Clinic Attending  I saw and evaluated the patient.  I personally confirmed the key portions of the history and exam documented by Dr. Speakman and I reviewed pertinent patient test results.  The assessment, diagnosis, and plan were formulated together and I agree with the documentation in the resident's note.  

## 2020-01-01 NOTE — Telephone Encounter (Signed)
Called pt - stated she needs a note to be out of work d/t the pain in her legs. She's still working at this times. Thanks

## 2020-01-01 NOTE — Telephone Encounter (Signed)
Spoke with patient on her mobile device. She states that she is experiencing severe leg pain and is requesting a note off work/stronger pain medication. She was seen by me two days ago for her pain and I started her on Naprosyn 500mg  BID and Tylenol 1000mg  TID. She states she has taken these regularly. We had a lengthy discussion that it may take several days for her to feel the full benefits of these medications and that stronger pain medications have not shown benefit over NSAIDs for chronic pain management. She notes interest in aquatics at the [facility]. We discussed today that she will continue her medications as prescribed and says she will go to work today prior to a long weekend this weekend. I suggested she try stretches from physical therapy this weekend in addition to her medications. She will check in with the [employer] regarding aquatics. May consider disability in the future if patient requests this.  Rebekah Howard, PGY1 Internal Medicine Pager: [phone]

## 2020-01-02 ENCOUNTER — Telehealth: Payer: Self-pay | Admitting: Student

## 2020-01-02 NOTE — Telephone Encounter (Signed)
Patient is interested in aquatics therapy at the [facility] for chronic back and leg pain. She states the [employer] requires my approval for her participation. I called Jonni Sanger, the physical therapy director of the [employer] and left a message that I approve for Rebekah Howard to participate in their therapy program. Hours of operation limited to MWF. Asked her to please call back [phone] with any concerns.  Phone #: [phone] Fax: [phone]

## 2020-01-02 NOTE — Telephone Encounter (Signed)
Spoke with patient and informed her that I left a message with the [employer] therapy group that she is medically approved to begin aquatics therapy. She states she was unable to go into work today secondary to her pain. She states she is currently on disability but requests a couple of months off of work for her PT and therapy. I instructed her to please contact her HR department at work to find out what paperwork is required from them for time off work. She has no other questions or concerns at this time.  Jeralyn Bennett, PGY1 Internal Medicine Pager: [phone]

## 2020-01-03 ENCOUNTER — Telehealth: Payer: Self-pay

## 2020-01-03 ENCOUNTER — Encounter: Payer: Self-pay | Admitting: Dietician

## 2020-01-03 ENCOUNTER — Ambulatory Visit: Payer: Medicaid Other | Admitting: Physical Therapy

## 2020-01-03 NOTE — Telephone Encounter (Signed)
Returned call to patient. States naproxen, tylenol, and duloxetine are not helping with right hip pain. Requesting steroid injection. Please advise. Hubbard Hartshorn, BSN, RN-BC

## 2020-01-03 NOTE — Telephone Encounter (Signed)
Please schedule appointment for evaluation in clinic.

## 2020-01-03 NOTE — Telephone Encounter (Signed)
Requesting to speak with a nurse about steroid injection. Please call pt back.

## 2020-01-04 ENCOUNTER — Emergency Department (HOSPITAL_COMMUNITY)
Admission: EM | Admit: 2020-01-04 | Discharge: 2020-01-04 | Disposition: A | Discharge status: Home / Self Care | Payer: Medicaid Other | Attending: Emergency Medicine | Admitting: Emergency Medicine

## 2020-01-04 ENCOUNTER — Ambulatory Visit (HOSPITAL_COMMUNITY)
Admission: EM | Admit: 2020-01-04 | Discharge: 2020-01-04 | Disposition: A | Discharge status: Home / Self Care | Payer: Medicaid Other | Attending: Physician Assistant | Admitting: Physician Assistant

## 2020-01-04 ENCOUNTER — Other Ambulatory Visit: Payer: Self-pay

## 2020-01-04 ENCOUNTER — Encounter (HOSPITAL_COMMUNITY): Payer: Self-pay

## 2020-01-04 DIAGNOSIS — Z5321 Procedure and treatment not carried out due to patient leaving prior to being seen by health care provider: Secondary | ICD-10-CM | POA: Insufficient documentation

## 2020-01-04 DIAGNOSIS — M79604 Pain in right leg: Secondary | ICD-10-CM | POA: Insufficient documentation

## 2020-01-04 DIAGNOSIS — M5431 Sciatica, right side: Secondary | ICD-10-CM

## 2020-01-04 MED ORDER — PREDNISONE 10 MG PO TABS: 40.0000 mg | ORAL_TABLET | Freq: Every day | ORAL | 0 refills | Status: AC

## 2020-01-04 MED ORDER — KETOROLAC TROMETHAMINE 60 MG/2ML IM SOLN
INTRAMUSCULAR | Status: AC
  Filled 2020-01-04: qty 2

## 2020-01-04 MED ORDER — KETOROLAC TROMETHAMINE 60 MG/2ML IM SOLN
60.0000 mg | Freq: Once | INTRAMUSCULAR | Status: AC
  Administered 2020-01-04: 60 mg via INTRAMUSCULAR

## 2020-01-04 NOTE — ED Notes (Signed)
Pt did not want to stay, told them would be best to stay but pt refused. Pt moved OTF.

## 2020-01-04 NOTE — Discharge Instructions (Signed)
Take the medication as prescribed  Hold your naproxen while taking prednisone  Follow up with your primary care

## 2020-01-04 NOTE — ED Triage Notes (Signed)
Pt c/o right posterior right leg pain to right buttocks. Pt states acute pain to area began approx 1 week ago and pt has been evaluated/tx at [facility] clinic for same. Pt reports that the pain Rx given "is not working". Pt reports numbness to right heel/toes.  Pt is requesting a "shot and does not want any more pills".

## 2020-01-04 NOTE — ED Triage Notes (Signed)
Pt arrives to ED w/ c/o 10/10 R leg pain that started 1.5 months ago. Pt denies edema, injury, fall, trauma. Pt states she has hx of arthritis.
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
## 2020-01-05 ENCOUNTER — Other Ambulatory Visit: Payer: Self-pay

## 2020-01-05 ENCOUNTER — Ambulatory Visit (HOSPITAL_COMMUNITY)
Admission: EM | Admit: 2020-01-05 | Discharge: 2020-01-05 | Disposition: A | Discharge status: Home / Self Care | Payer: Medicaid Other | Attending: Family Medicine | Admitting: Family Medicine

## 2020-01-05 ENCOUNTER — Encounter (HOSPITAL_COMMUNITY): Payer: Self-pay

## 2020-01-05 DIAGNOSIS — G5701 Lesion of sciatic nerve, right lower limb: Secondary | ICD-10-CM | POA: Diagnosis not present

## 2020-01-05 MED ORDER — METHYLPREDNISOLONE ACETATE 40 MG/ML IJ SUSP
INTRAMUSCULAR | Status: AC
  Filled 2020-01-05: qty 1

## 2020-01-05 MED ORDER — KETOROLAC TROMETHAMINE 30 MG/ML IJ SOLN
30.0000 mg | Freq: Once | INTRAMUSCULAR | Status: AC
  Administered 2020-01-05: 30 mg via INTRAMUSCULAR

## 2020-01-05 MED ORDER — KETOROLAC TROMETHAMINE 30 MG/ML IJ SOLN
INTRAMUSCULAR | Status: AC
  Filled 2020-01-05: qty 1

## 2020-01-05 MED ORDER — METHYLPREDNISOLONE ACETATE 40 MG/ML IJ SUSP
40.0000 mg | Freq: Once | INTRAMUSCULAR | Status: AC
  Administered 2020-01-05: 40 mg via INTRAMUSCULAR

## 2020-01-05 NOTE — Discharge Instructions (Signed)
Please try ibuprofen  Please try the exercises  Please follow up if your symptoms fail to improve.
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
## 2020-01-05 NOTE — ED Triage Notes (Signed)
Pt c/o 10/10 sharp pain in right hipx2-3 mos. Pt states the pain got worse today. Pt states she came here for a shot yesterday, but it didn't help the pain. Pt walked well to exam room.

## 2020-01-06 ENCOUNTER — Telehealth: Payer: Self-pay | Admitting: *Deleted

## 2020-01-06 DIAGNOSIS — M5431 Sciatica, right side: Secondary | ICD-10-CM

## 2020-01-06 NOTE — Telephone Encounter (Signed)
Contacted pt to complete transition of care assessment:  Transition Care Management Follow-up Telephone Call  . Medicaid Managed Care Transition Call Status:MM [facility] Call Made  . Date of discharge and from where: 01/04/20 and 01/05/20 from [facility]  . How have you been since you were released from the hospital?" Still in pain"  . Any questions or concerns? "Still in pain"; pt also complained of being light headed after taking 1st dose of prednisone this morning. She is alone because "everyone has gone to work". Pt advised to return to the ED for evaluation because she can not be seen in PCP's office until 01/08/20.  Items Reviewed: Marland Kitchen Did the pt receive and understand the discharge instructions provided? Yes  . Medications obtained and verified? Yes  . Any new allergies since your discharge? No  . Dietary orders reviewed? N/a . Do you have support at home?   Functional Questionnaire: (I = Independent and D = Dependent)  ADLs: Independent Bathing/Dressing:Independent Meal Prep: Independent Eating: Independent Maintaining continence: Independent Transferring/Ambulation: Independent Managing Meds: Independent   Follow up appointments reviewed:  PCP Hospital f/u appt confirmed? Yes  Scheduled to see Winferd Humphrey on 01/08/20 @1545   [facility]ist Hospital  f/u appt confirmed yes  Scheduled to see campbell Owens Shark (ortho) on  01/10/20 @ 1545  Are transportation arrangements needed? No   If their condition worsens, is the pt aware to call PCP or go to the EmergencyDept.? No  Was the patient provided with contact information for the PCP's office or ED? Yes  Was to pt encouraged to call back with questions or concerns? Yes  Orders placed for Community Coordination of Care due to greater that 2 visits in past 2 weeks.

## 2020-01-08 ENCOUNTER — Other Ambulatory Visit: Payer: Self-pay

## 2020-01-08 ENCOUNTER — Ambulatory Visit (INDEPENDENT_AMBULATORY_CARE_PROVIDER_SITE_OTHER): Payer: Medicaid Other | Admitting: Internal Medicine

## 2020-01-08 ENCOUNTER — Encounter: Payer: Self-pay | Admitting: Internal Medicine

## 2020-01-08 DIAGNOSIS — M5431 Sciatica, right side: Secondary | ICD-10-CM

## 2020-01-08 DIAGNOSIS — M5136 Other intervertebral disc degeneration, lumbar region: Secondary | ICD-10-CM

## 2020-01-08 MED ORDER — PREDNISONE 20 MG PO TABS: 20.0000 mg | ORAL_TABLET | Freq: Every day | ORAL | 0 refills | Status: DC

## 2020-01-08 MED ORDER — NAPROXEN 500 MG PO TBEC: 500.0000 mg | DELAYED_RELEASE_TABLET | Freq: Two times a day (BID) | ORAL | 0 refills | Status: AC

## 2020-01-08 NOTE — Patient Instructions (Addendum)
It was nice seeing you today! Thank you for choosing [facility] for your Primary Care.    Today we talked about:   1. Sciatica, also can be called Piriformis Syndrome: Please take the Naproxen twice per day for the next 2 weeks. In addition, start the Prednisone 20 mg daily after you finish the 40 mg bottle.
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
## 2020-01-09 DIAGNOSIS — M5431 Sciatica, right side: Secondary | ICD-10-CM | POA: Insufficient documentation

## 2020-01-09 NOTE — Assessment & Plan Note (Addendum)
[name] is presenting to the clinic for evaluation of her right-sided sciatica.  She states the symptoms began approximately 2 weeks ago with pain in the middle of her gluteal region that radiated down the back of her leg, back of her calf extending to her heel, at which point she has numbness and tingling.  She states the most frustrating part is the pain in her gluteal region as she finds it very difficult to find a comfortable position.  She is concerned she will not be able to participate in her daughter's wedding in a month with this pain.  She denies any acute lower back pain associated with this, or any left leg pain she notes a couple episodes of urinary incontinence in the past 2 weeks, but feels it is likely from waiting too long to go to the restroom.  She denies any bowel incontinence.  She denies any rectal or genital numbness or tingling.  She denies any weakness in her lower extremities.  At last visit with PCP, she was started on duloxetine, which she could not tolerate due to mood swings associated with it.  She was also given naproxen 250 twice daily, which she took for several days but found it did not alleviate her pain at all.  She visited an urgent care a couple days ago which started her on Prednisone.  Rebekah Howard states that her symptoms have gotten a little better since that time.  Assessment/plan: Symptomology most consistent with sciatica of the right side without back pain at this moment.  Only red flag symptom would be the urinary incontinence but given description, may have been overflow incontinence.  No evidence of muscle weakness or foot drop.  We will hold off on ordering MRI at this time.   Given improvement with glucocorticoids, will extend her prednisone taper to include an additional 5 days of 20 mg.  Increased her naproxen to 500 mg twice daily and encouraged her to take it daily scheduled for at least 1 week.  -Finish prednisone 40 mg 5-day course. -Start  prednisone 20 mg at that time for an additional 5 days -Naproxen 500 mg twice daily scheduled -Sciatica focused stretches, PDF provided -2-week follow-up -If no improvement at that time, consider MRI

## 2020-01-10 ENCOUNTER — Telehealth: Payer: Self-pay | Admitting: *Deleted

## 2020-01-10 ENCOUNTER — Encounter: Payer: Self-pay | Admitting: Physical Therapy

## 2020-01-10 ENCOUNTER — Ambulatory Visit
Admission: RE | Admit: 2020-01-10 | Discharge: 2020-01-10 | Disposition: A | Discharge status: Home / Self Care | Payer: PRIVATE HEALTH INSURANCE | Source: Ambulatory Visit | Attending: Internal Medicine | Admitting: Internal Medicine

## 2020-01-10 ENCOUNTER — Other Ambulatory Visit: Payer: Self-pay

## 2020-01-10 ENCOUNTER — Ambulatory Visit: Payer: Medicaid Other | Attending: Internal Medicine | Admitting: Physical Therapy

## 2020-01-10 DIAGNOSIS — M545 Low back pain: Secondary | ICD-10-CM | POA: Diagnosis not present

## 2020-01-10 DIAGNOSIS — M25561 Pain in right knee: Secondary | ICD-10-CM | POA: Diagnosis not present

## 2020-01-10 DIAGNOSIS — M6281 Muscle weakness (generalized): Secondary | ICD-10-CM

## 2020-01-10 DIAGNOSIS — G8929 Other chronic pain: Secondary | ICD-10-CM | POA: Diagnosis not present

## 2020-01-10 DIAGNOSIS — Z1231 Encounter for screening mammogram for malignant neoplasm of breast: Secondary | ICD-10-CM

## 2020-01-10 DIAGNOSIS — M25552 Pain in left hip: Secondary | ICD-10-CM

## 2020-01-10 DIAGNOSIS — M25551 Pain in right hip: Secondary | ICD-10-CM | POA: Diagnosis not present

## 2020-01-10 DIAGNOSIS — R2689 Other abnormalities of gait and mobility: Secondary | ICD-10-CM | POA: Diagnosis not present
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
## 2020-01-10 NOTE — Patient Instructions (Signed)
Access Code: [email or web address] URL: [email or web address] Date: 01/10/2020 Prepared by: Hilda Blades  Exercises Supine Lower Trunk Rotation - 2 x daily - 7 x weekly - 5 reps - 10 seconds hold Hooklying Single Knee to Chest Stretch - 2 x daily - 7 x weekly - 3 reps - 30 seconds hold Seated Hamstring Stretch - 2 x daily - 7 x weekly - 30 sec hold - 3 reps Seated Table Piriformis Stretch - 2 x daily - 7 x weekly - 3 reps - 30 seconds hold Sidelying Hip Abduction - 1 x daily - 7 x weekly - 2 sets - 10 reps Supine Active Straight Leg Raise - 1 x daily - 7 x weekly - 2 sets - 10 reps Bridge - 1 x daily - 7 x weekly - 2 sets - 10 reps Piriformis Mobilization on Foam Roll

## 2020-01-10 NOTE — Progress Notes (Signed)
Internal Medicine Clinic Attending  Case discussed with Dr. Basaraba  At the time of the visit.  We reviewed the resident's history and exam and pertinent patient test results.  I agree with the assessment, diagnosis, and plan of care documented in the resident's note.  

## 2020-01-10 NOTE — Telephone Encounter (Signed)
  Care Management   Outreach Note  01/10/2020 Name: Rebekah Howard MRN: [id number] DOB: 02-May-1961  Referred by: Jeralyn Bennett, MD Reason for referral : Care Coordination (HLD, HTN)   An unsuccessful telephone outreach was attempted today. The patient was referred to the case management team for assistance with care management and care coordination.  Follow Up Plan: A HIPPA compliant phone message was left for the patient providing contact information and requesting a return call.  The care management team will reach out to the patient again over the next 3 days.   Kelli Churn RN, CCM, [facility] RN Care Manager [phone]

## 2020-01-10 NOTE — Addendum Note (Signed)
Addended by: Bobette Mo on: 01/10/2020 12:37 PM   Modules accepted: Orders

## 2020-01-13 ENCOUNTER — Ambulatory Visit: Payer: Medicaid Other | Admitting: *Deleted

## 2020-01-13 DIAGNOSIS — M5431 Sciatica, right side: Secondary | ICD-10-CM

## 2020-01-13 DIAGNOSIS — I10 Essential (primary) hypertension: Secondary | ICD-10-CM

## 2020-01-13 DIAGNOSIS — E785 Hyperlipidemia, unspecified: Secondary | ICD-10-CM
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
## 2020-01-13 NOTE — Progress Notes (Signed)
Internal Medicine Clinic Resident  I have personally reviewed this encounter including the documentation in this note and/or discussed this patient with the care management provider. I will address any urgent items identified by the care management provider and will communicate my actions to the patient's PCP. I have reviewed the patient's CCM visit with my supervising attending, Dr Daryll Drown.  Sanjuana Letters, MD 01/13/2020
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
## 2020-01-16 ENCOUNTER — Ambulatory Visit: Payer: Medicaid Other | Admitting: *Deleted

## 2020-01-16 ENCOUNTER — Telehealth: Payer: Self-pay

## 2020-01-16 DIAGNOSIS — I10 Essential (primary) hypertension: Secondary | ICD-10-CM

## 2020-01-16 DIAGNOSIS — E785 Hyperlipidemia, unspecified: Secondary | ICD-10-CM
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
## 2020-01-16 NOTE — Telephone Encounter (Signed)
Pt would like to speak with you. Please call pt back.

## 2020-01-17 ENCOUNTER — Ambulatory Visit: Payer: Medicaid Other | Admitting: Physical Therapy

## 2020-01-17 ENCOUNTER — Other Ambulatory Visit: Payer: Self-pay

## 2020-01-17 ENCOUNTER — Encounter: Payer: Self-pay | Admitting: Physical Therapy

## 2020-01-17 DIAGNOSIS — M6281 Muscle weakness (generalized): Secondary | ICD-10-CM

## 2020-01-17 DIAGNOSIS — G8929 Other chronic pain: Secondary | ICD-10-CM | POA: Diagnosis not present

## 2020-01-17 DIAGNOSIS — M25551 Pain in right hip: Secondary | ICD-10-CM | POA: Diagnosis not present

## 2020-01-17 DIAGNOSIS — M25552 Pain in left hip: Secondary | ICD-10-CM | POA: Diagnosis not present

## 2020-01-17 DIAGNOSIS — M545 Low back pain: Secondary | ICD-10-CM | POA: Diagnosis not present

## 2020-01-17 DIAGNOSIS — M25561 Pain in right knee: Secondary | ICD-10-CM | POA: Diagnosis not present

## 2020-01-17 DIAGNOSIS — R2689 Other abnormalities of gait and mobility: Secondary | ICD-10-CM
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
## 2020-01-17 NOTE — Patient Instructions (Signed)

## 2020-01-17 NOTE — Progress Notes (Signed)
Pt has been scheduled for 01/20/2020

## 2020-01-20 ENCOUNTER — Ambulatory Visit: Payer: Medicaid Other

## 2020-01-20 ENCOUNTER — Ambulatory Visit: Payer: Medicaid Other | Admitting: *Deleted

## 2020-01-20 ENCOUNTER — Ambulatory Visit (INDEPENDENT_AMBULATORY_CARE_PROVIDER_SITE_OTHER): Payer: Medicaid Other | Admitting: Internal Medicine

## 2020-01-20 VITALS — BP 123/62 | HR 62

## 2020-01-20 DIAGNOSIS — M5431 Sciatica, right side: Secondary | ICD-10-CM

## 2020-01-20 DIAGNOSIS — E785 Hyperlipidemia, unspecified: Secondary | ICD-10-CM

## 2020-01-20 DIAGNOSIS — E7800 Pure hypercholesterolemia, unspecified: Secondary | ICD-10-CM

## 2020-01-20 DIAGNOSIS — I10 Essential (primary) hypertension: Secondary | ICD-10-CM

## 2020-01-20 MED ORDER — METHOCARBAMOL 750 MG PO TABS: ORAL_TABLET | ORAL | 0 refills | Status: DC

## 2020-01-20 MED ORDER — KETOROLAC TROMETHAMINE 60 MG/2ML IM SOLN
60.0000 mg | Freq: Once | INTRAMUSCULAR | Status: AC
  Administered 2020-01-20: 60 mg via INTRAMUSCULAR

## 2020-01-20 MED ORDER — DICLOFENAC SODIUM 50 MG PO TBEC: 50.0000 mg | DELAYED_RELEASE_TABLET | Freq: Two times a day (BID) | ORAL | 0 refills | Status: AC

## 2020-01-20 NOTE — Patient Instructions (Signed)
It was nice seeing you today! Thank you for choosing [facility] for your Primary Care.    Today we talked about:   1. Sciatica: I sent in a prescription for a stronger anti-inflammatory medication, as well as a muscle relaxer. Please take daily as instructed for the next 1 week.  a. Follow up in 1 month to determine if imaging if needed.
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
## 2020-01-20 NOTE — Progress Notes (Signed)
Internal Medicine Clinic Resident  I have personally reviewed this encounter including the documentation in this note and/or discussed this patient with the care management provider. I will address any urgent items identified by the care management provider and will communicate my actions to the patient's PCP. I have reviewed the patient's CCM visit with my supervising attending, Dr Heber .  Andrew Au, MD 01/20/2020

## 2020-01-20 NOTE — Progress Notes (Signed)
Internal Medicine Clinic Resident  I have personally reviewed this encounter including the documentation in this note and/or discussed this patient with the care management provider. I will address any urgent items identified by the care management provider and will communicate my actions to the patient's PCP. I have reviewed the patient's CCM visit with my supervising attending, Dr Heber [place].  Andrew Au, MD 01/20/2020

## 2020-01-20 NOTE — Patient Instructions (Signed)
Visit Information It was nice meeting you today.  Goals Addressed              This Visit's Progress     Patient Stated   .  " I'd like to start checking my blood pressure at home." (pt-stated)        CARE PLAN ENTRY (see longitudinal plan of care for additional care plan information)  Objective:  . Last practice recorded BP readings:  . BP Readings from Last 3 Encounters: .  01/20/20 . 123/62 .  01/08/20 . 134/89 .  01/05/20 . (!) 161/85   . Most recent eGFR/CrCl: No results found for: EGFR  No components found for: CRCL  Current Barriers:  Marland Kitchen Knowledge Deficits related to basic understanding of hypertension pathophysiology and self care management- met with patient during her acute clinic visit for leg and back pain.  She states she received the educational information that was mailed to her by this CCM RN on managing HTN and HLD and the [place] Management calendar. She expressed appreciation for the information. She says she will contact this CCM RN when [employer] advises her of the contracted supplier for home BP monitors  Case Manager Clinical Goal(s):  Marland Kitchen Over the next 30-60  days, patient will demonstrate improved adherence to prescribed treatment plan for hypertension as evidenced by taking all medications as prescribed, monitoring and recording blood pressure as directed if her health plan covers the cost of a home BP monitor, adhering to low sodium/DASH diet  Interventions:  Marland Kitchen Met with patient during her acute clinic visit to get signature on housing form requested by CCM BSW and to discuss receipt of HTN/HLD educational material and to review process for obtaining home BP monitor  Patient Self Care Activities:  . UNABLE to independently secure home BP monitor and self monitor blood pressure  Please see past updates related to this goal by clicking on the "Past Updates" button in the selected goal       .  "I'd like to know how to bring down my  triglycerides." (pt-stated)        CARE PLAN ENTRY (see longitudinal plan of care for additional care plan information)  Current Barriers:  . Chronic Disease Management support and education needs related to HLD .  Nurse Case Manager Clinical Goal(s):  Marland Kitchen Over the next 30-60 days, patient will demonstrate improved health management independence as evidenced by reviewing  educational information mailed to her and voicing how to reduce triglycerides via lifestyle changes  Interventions:  . Inter-disciplinary care team collaboration (see longitudinal plan of care) . Met with patient during acute clinic visit and verified that she received in mail the handout on Cholesterol and Triglycerides that discuss how to lower triglycerides and increase HDL via lifestyle changes . Discussed plans with patient for ongoing care management follow up and provided patient with direct contact information for care management team  Patient Self Care Activities:  . Patient verbalizes understanding of plan to work with CCM team to improve triglycerides . Self administers medications as prescribed . Attends all scheduled provider appointments . Calls pharmacy for medication refills . Performs ADL's independently . Performs IADL's independently . Calls provider office for new concerns or questions . Unable to independently lower triglycerides   Please see past updates related to this goal by clicking on the "Past Updates" button in the selected goal      .  "I'm not safe in the apartment where  I am right now; I'd like to talk with a Education officer, museum about it." (pt-stated)        [place] (see longitudinal plan of care for additional care plan information)   Current Barriers:  . Chronic Disease Management support, education, and care coordination needs related to HTN, HLD, and Hypertriglyceridemia- met with patient during her acute clinic appointment to introduce CCM RN and for her signature on section 8  housing form per CCM BSW's request.  Patient states she is fearful that she will not be able to  attend her daughter's wedding on 02/09/20 if her leg and back pain do not improve  Case Manager Clinical Goal(s):  Marland Kitchen Over the next 30-90 days, patient will work with BSW to address needs related to Housing barriers in patient with HTN, HLD, and Hypertriglyceridemia  Interventions:  . Obtained patient's signature on the Supplement to Application for [employer] for collaboration   Patient Self Care Activities:  . Patient verbalizes understanding of plan to work with BSW to address housing concerns  Please see past updates related to this goal by clicking on the "Past Updates" button in the selected goal          The patient verbalized understanding of instructions provided today and declined a print copy of patient instruction materials.   The care management team will reach out to the patient again over the next 30-60 days.   Kelli Churn RN, CCM, [facility] RN Care Manager [phone]

## 2020-01-20 NOTE — Assessment & Plan Note (Addendum)
Rebekah Howard presents today for follow up of sciatica on the right side. She states pain has not improved at all and continues to have shooting pain with paresthesias to her right heel that seems to start in the mid gluteal region. She denies any back pain at this time. She denies any saddle anesthesia, new urinary symptoms including retention or incontinence, stool incontinence, focal weakness including difficulty with lifting foot. She has gone to two PT sessions since our last follow up.   Assessment/Plan:  Patient continues to be symptomatic despite systemic steroids, OTC NSAIDs. She has initiated PT and I recommended she continue. Will try a different NSAID as well as muscle relaxer.   Imaging not indicated until 8 weeks since start of symptoms with failure to improve. No red flag signs or symptoms; she is having right leg weakness but is mostly secondary to pain.   - Continue PT - Toradol 60 mg IM in clinic - Diclofenac 50 mg BID x 1 week - Robaxin 750 mg x 1 week - Follow up in 4 weeks
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
## 2020-01-21 ENCOUNTER — Ambulatory Visit: Payer: Medicaid Other

## 2020-01-21 ENCOUNTER — Ambulatory Visit: Payer: Medicaid Other | Admitting: Physical Therapy

## 2020-01-21 DIAGNOSIS — R7303 Prediabetes: Secondary | ICD-10-CM

## 2020-01-21 DIAGNOSIS — I10 Essential (primary) hypertension: Secondary | ICD-10-CM

## 2020-01-21 DIAGNOSIS — M5431 Sciatica, right side: Secondary | ICD-10-CM

## 2020-01-21 NOTE — Progress Notes (Signed)
Internal Medicine Clinic Resident  I have personally reviewed this encounter including the documentation in this note and/or discussed this patient with the care management provider. I will address any urgent items identified by the care management provider and will communicate my actions to the patient's PCP. I have reviewed the patient's CCM visit with my supervising attending, Dr Rebeca Alert.  Andrew Au, MD 01/21/2020

## 2020-01-21 NOTE — Progress Notes (Signed)
Internal Medicine Clinic Attending  Case discussed with Dr. Basaraba  At the time of the visit.  We reviewed the resident's history and exam and pertinent patient test results.  I agree with the assessment, diagnosis, and plan of care documented in the resident's note.  
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
## 2020-01-21 NOTE — Patient Instructions (Signed)
Visit Information  Goals Addressed              This Visit's Progress   .  "I'm not safe in the apartment where I am right now; I'd like to talk with a social worker about it." (pt-stated)        CARE PLAN ENTRY (see longitudinal plan of care for additional care plan information)   Current Barriers:  . Chronic Disease Management support, education, and care coordination needs related to HTN, HLD, and Hypertriglyceridemia- met with patient during her acute clinic appointment to introduce CCM RN and for her signature on section 8 housing form per CCM BSW's request.  Patient states she is fearful that she will not be able to  attend her daughter's wedding on 02/09/20 if her leg and back pain do not improve  Case Manager Clinical Goal(s):  . Over the next 30-90 days, patient will work with BSW to address needs related to Housing barriers in patient with HTN, HLD, and Hypertriglyceridemia  Interventions:  . Left voicemail message for Caseworker with [place] [employer], Karen McGill,regarding collaboration for patient requesting relocation . Faxed signed Supplement to Application for [employer] to Ms. McGill as this document is required for collaboration with [employer] . Called patient to inform her that consent was faxed to Ms. McGill and voicemail message was left for her requesting return call.   Patient Self Care Activities:  . Patient verbalizes understanding of plan to work with BSW to address housing concerns  Please see past updates related to this goal by clicking on the "Past Updates" button in the selected goal         Patient verbalizes understanding of instructions provided today.   The patient has been provided with contact information for the care management team and has been advised to call with any health/community related questions or concerns.      Jameshia Hayashida, BSW Embed[facility] Care Coordination Social Worker [facility] [phone]                             

## 2020-01-22 NOTE — Progress Notes (Signed)
Internal Medicine Clinic Attending  CCM services provided by the care management provider and their documentation were reviewed with Dr. Chen.  We reviewed the pertinent findings, urgent action items addressed by the resident and non-urgent items to be addressed by the PCP.  I agree with the assessment, diagnosis, and plan of care documented in the CCM and resident's note.  Reeve Turnley N Garry Bochicchio, MD 01/22/2020  

## 2020-01-24 ENCOUNTER — Telehealth: Payer: Self-pay | Admitting: *Deleted

## 2020-01-24 ENCOUNTER — Ambulatory Visit (INDEPENDENT_AMBULATORY_CARE_PROVIDER_SITE_OTHER): Payer: Medicaid Other | Admitting: Internal Medicine

## 2020-01-24 DIAGNOSIS — M5431 Sciatica, right side: Secondary | ICD-10-CM

## 2020-01-24 NOTE — Telephone Encounter (Signed)
Information was sent through CoverMyMeds for [place] for [employer] Approved 12/25/8286 thru 01/23/2021.  Sander Nephew, RN 01/24/2020 9:20 AM  PA Case [id number].

## 2020-01-24 NOTE — Patient Instructions (Signed)
It was nice seeing you today! Thank you for choosing [facility] for your Primary Care.    Today we talked about:   1. Contact Dr. Alinda Money office to schedule a follow up [phone]) 2. Please stop taking the Diclofenac and Robaxin (methocarbamol) if it is not helping with your painh
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
## 2020-01-27 ENCOUNTER — Other Ambulatory Visit: Payer: Self-pay | Admitting: Internal Medicine

## 2020-01-27 ENCOUNTER — Ambulatory Visit: Payer: Medicaid Other

## 2020-01-27 DIAGNOSIS — I10 Essential (primary) hypertension: Secondary | ICD-10-CM

## 2020-01-27 DIAGNOSIS — M5431 Sciatica, right side: Secondary | ICD-10-CM

## 2020-01-27 DIAGNOSIS — E785 Hyperlipidemia, unspecified: Secondary | ICD-10-CM

## 2020-01-27 NOTE — Progress Notes (Signed)
Internal Medicine Clinic Attending  I saw and evaluated the patient.  I personally confirmed the key portions of the history and exam documented by Dr. Basaraba and I reviewed pertinent patient test results.  The assessment, diagnosis, and plan were formulated together and I agree with the documentation in the resident's note.  Maloni Musleh, M.D., Ph.D. 

## 2020-01-27 NOTE — Telephone Encounter (Signed)
Patient stated at last visit on 01/24/20 that the medication did not help with pain at all. I recommended she discontinue at that time.  Refill not appropriate at this time.   Thank you!

## 2020-01-27 NOTE — Telephone Encounter (Signed)
Pt calls and is upset that she has not gotten her refill, rec'd 7 request today pt states she ran out yesterday and picked up on 8/17 Please advise asap

## 2020-01-27 NOTE — Telephone Encounter (Signed)
Refill Request- Pt would like a call back about her medications.   methocarbamol (ROBAXIN-750) 750 MG tablet   Diclofenac Potassium 50 MG   [facility] - [place], [place] - [address]

## 2020-01-27 NOTE — Telephone Encounter (Signed)
Pt is calling regarding medicine [phone]

## 2020-01-27 NOTE — Chronic Care Management (AMB) (Signed)
  Care Management   Follow Up Note   01/27/2020 Name: Rebekah Howard MRN: [id number] DOB: Jul 02, 1960  Referred by: Jeralyn Bennett, MD Reason for referral : Care Coordination (Housing)   Rebekah Howard is a 59 y.o. year old female who is a primary care patient of Jeralyn Bennett, MD. The care management team was consulted for assistance with care management and care coordination needs.    Review of patient status, including review of consultants reports, relevant laboratory and other test results, and collaboration with appropriate care team members and the patient's provider was performed as part of comprehensive patient evaluation and provision of chronic care management services.    SDOH (Social Determinants of Health) assessments performed: No See Care Plan activities for detailed interventions related to [facility])     Advanced Directives: See Care Plan and Vyn[employer] application for related entries.   Goals Addressed              This Visit's Progress   .  "I'm not safe in the apartment where I am right now; I'd like to talk with a Education officer, museum about it." (pt-stated)        [place] (see longitudinal plan of care for additional care plan information)   Current Barriers:  . Chronic Disease Management support, education, and care coordination needs related to HTN, HLD, and Hypertriglyceridemia- met with patient during her acute clinic appointment to introduce CCM RN and for her signature on section 8 housing form per CCM BSW's request.  Patient states she is fearful that she will not be able to  attend her daughter's wedding on 02/09/20 if her leg and back pain do not improve  Case Manager Clinical Goal(s):  Marland Kitchen Over the next 30-90 days, patient will work with BSW to address needs related to Housing barriers in patient with HTN, HLD, and Hypertriglyceridemia  Interventions:  . Talked to Caseworker with [employer], Santiago Glad McGill,regarding patient's  request to relocate . Informed patient that, per Ms. McGill, she has been approved for relocation but must select a property and inform the housing authority . Mailed list of properties to patient from socialserve and zillow webs sites as patient states she does not have internet access . Informed patient that locating a new property is likely to be very challenging during this time of pandemic Patient Self Care Activities:  . Patient verbalizes understanding of plan to work with BSW to address housing concerns  Please see past updates related to this goal by clicking on the "Past Updates" button in the selected goal          Telephone follow up appointment with care management team member scheduled for:02/12/20      Ronn Melena, [place] Coordination Social Worker [place] [phone]

## 2020-01-27 NOTE — Assessment & Plan Note (Addendum)
Rebekah Howard presents today due to continued pain in her right gluteal region that radiates down the back of her leg all the way to her heel and stops there.  She describes the pain as a shooting sensation with numbness in her heel.  At her previous visit, we adjusted treatment to diclofenac and Mobic.  She states the Mobic just makes her sleepy and the diclofenac nor the Mobic help with the pain.  She is feeling very frustrated at this time as she has her daughter's wedding in the next month and is concerned she will be able to participate due to this pain.   Assessment/plan: Patient's right sided sciatica/piriformis syndrome has continued to be quite difficult to control despite multiple different agents tried including prednisone, naproxen, and now Mobic and diclofenac.  Patient does not have any red flag symptoms to suggest spinal involvement including urinary retention, urinary incontinence, stool incontinence, genital/rectal anesthesia.  I discussed with her that imaging would include an MRI and that would be premature at this time.  Patient had previously followed up with sports medicine for this but has not seen them since.  I recommended she contact their clinic and schedule a follow-up visit for further evaluation.  -Follow-up with sports medicine -Discontinue Mobic and diclofenac given no pain relief

## 2020-01-27 NOTE — Patient Instructions (Signed)
Visit Information  Goals Addressed              This Visit's Progress     "I'm not safe in the apartment where I am right now; I'd like to talk with a Education officer, museum about it." (pt-stated)        [place] (see longitudinal plan of care for additional care plan information)   Current Barriers:   Chronic Disease Management support, education, and care coordination needs related to HTN, HLD, and Hypertriglyceridemia- met with patient during her acute clinic appointment to introduce CCM RN and for her signature on section 8 housing form per CCM BSW's request.  Patient states she is fearful that she will not be able to  attend her daughter's wedding on 02/09/20 if her leg and back pain do not improve  Case Manager Clinical Goal(s):   Over the next 30-90 days, patient will work with BSW to address needs related to Housing barriers in patient with HTN, HLD, and Hypertriglyceridemia  Interventions:   Talked to Caseworker with [employer], Santiago Glad McGill,regarding patient's request to relocate  Informed patient that, per Ms. McGill, she has been approved for relocation but must select a property and inform the housing authority  Mailed list of properties to patient from socialserve and zillow webs sites as patient states she does not have internet access  Informed patient that locating a new property is likely to be very challenging during this time of pandemic Patient Self Care Activities:   Patient verbalizes understanding of plan to work with BSW to address housing concerns  Please see past updates related to this goal by clicking on the "Past Updates" button in the selected goal         Patient verbalizes understanding of instructions provided today.   Telephone follow up appointment with care management team member scheduled for 02/12/20     Ronn Melena, [place] Coordination Social Worker [place] [phone]

## 2020-01-28 ENCOUNTER — Other Ambulatory Visit: Payer: Self-pay

## 2020-01-28 ENCOUNTER — Ambulatory Visit (INDEPENDENT_AMBULATORY_CARE_PROVIDER_SITE_OTHER): Payer: Medicaid Other | Admitting: Sports Medicine

## 2020-01-28 VITALS — BP 141/82 | Ht 66.0 in | Wt 316.0 lb

## 2020-01-28 DIAGNOSIS — M25551 Pain in right hip: Secondary | ICD-10-CM

## 2020-01-28 MED ORDER — METHOCARBAMOL 750 MG PO TABS
ORAL_TABLET | ORAL | 0 refills | Status: DC
Start: 2020-01-28 — End: 2020-04-21

## 2020-01-29 ENCOUNTER — Other Ambulatory Visit: Payer: Self-pay | Admitting: Sports Medicine

## 2020-01-29 ENCOUNTER — Telehealth: Payer: Self-pay | Admitting: Physical Therapy

## 2020-01-29 ENCOUNTER — Ambulatory Visit: Payer: Medicaid Other | Admitting: Physical Therapy

## 2020-01-29 DIAGNOSIS — M25551 Pain in right hip: Secondary | ICD-10-CM

## 2020-01-29 NOTE — Progress Notes (Signed)
   Subjective:    Patient ID: Rebekah Howard, female    DOB: [birth year], 59 y.o.   MRN: [id number]  HPI chief complaint: Right hip pain  Patient comes in today complaining of 1 month of right hip pain that she localizes to the groin.  Pain will radiate to the posterior hip as well.  Worse with activity.  X-rays done in June showed some mild degenerative changes of the right hip.  Pain will occasionally radiate down her leg.  Her daughter is getting married next month and she is concerned about her worsening hip pain.    Review of Systems    As above  Objective:   Physical Exam  Obese.  No acute distress.  Right hip shows good range of motion but reproducible pain with passive internal and external rotation.  Diffuse tenderness to palpation.  Neurovascular intact distally.      Assessment & Plan:   Right hip pain likely secondary to DJD  Patient is referred to [facility] for a fluoroscopically guided diagnostic/therapeutic intra-articular right hip injection.  He is unable to take NSAIDs and Tylenol is not helping.  She was given Robaxin a few days ago and states this does help some.  We discussed changing her Robaxin to Flexeril but she elected to stay with her Robaxin for now.  Follow-up with me as needed.

## 2020-01-29 NOTE — Telephone Encounter (Signed)
Spoke with pt regarding missed appointment today and discussed when her next appointment is. Discussed if she is unable to make it to her next appointment on Friday to call and we can cancel before her designated appointment time. Reviewed attendance policy that was provided on initial evaluation.  Leina Babe PT, DPT, LAT, ATC  01/29/20  11:36 AM

## 2020-01-29 NOTE — Progress Notes (Signed)
Internal Medicine Clinic Resident  I have personally reviewed this encounter including the documentation in this note and/or discussed this patient with the care management provider. I will address any urgent items identified by the care management provider and will communicate my actions to the patient's PCP. I have reviewed the patient's CCM visit with my supervising attending, Dr Vincent.  Scotlynn Noyes, MD 01/29/2020    

## 2020-01-30 NOTE — Progress Notes (Signed)
Internal Medicine Clinic Attending  CCM services provided by the care management provider and their documentation were discussed with Dr. Basaraba. We reviewed the pertinent findings, urgent action items addressed by the resident and non-urgent items to be addressed by the PCP.  I agree with the assessment, diagnosis, and plan of care documented in the CCM and resident's note.  Clevester Helzer Thomas Kiamesha Samet, MD 01/30/2020  

## 2020-01-31 ENCOUNTER — Ambulatory Visit: Payer: Medicaid Other | Admitting: Physical Therapy

## 2020-02-05 ENCOUNTER — Ambulatory Visit
Admission: RE | Admit: 2020-02-05 | Discharge: 2020-02-05 | Disposition: A | Discharge status: Home / Self Care | Payer: Medicaid Other | Source: Ambulatory Visit | Attending: Sports Medicine | Admitting: Sports Medicine

## 2020-02-05 DIAGNOSIS — M25551 Pain in right hip: Secondary | ICD-10-CM | POA: Diagnosis not present

## 2020-02-05 MED ORDER — IOPAMIDOL (ISOVUE-M 200) INJECTION 41%
1.0000 mL | Freq: Once | INTRAMUSCULAR | Status: AC
  Administered 2020-02-05: 1 mL via INTRA_ARTICULAR

## 2020-02-05 MED ORDER — METHYLPREDNISOLONE ACETATE 40 MG/ML INJ SUSP (RADIOLOG
120.0000 mg | Freq: Once | INTRAMUSCULAR | Status: AC
  Administered 2020-02-05: 120 mg via INTRA_ARTICULAR

## 2020-02-06 ENCOUNTER — Other Ambulatory Visit: Payer: Self-pay | Admitting: Internal Medicine

## 2020-02-06 DIAGNOSIS — R7303 Prediabetes: Secondary | ICD-10-CM

## 2020-02-11 ENCOUNTER — Telehealth: Payer: Self-pay | Admitting: *Deleted

## 2020-02-11 ENCOUNTER — Telehealth: Payer: Medicaid Other

## 2020-02-11 ENCOUNTER — Ambulatory Visit (INDEPENDENT_AMBULATORY_CARE_PROVIDER_SITE_OTHER): Payer: Medicaid Other | Admitting: Sports Medicine

## 2020-02-11 ENCOUNTER — Other Ambulatory Visit: Payer: Self-pay

## 2020-02-11 VITALS — BP 138/80 | Ht 66.0 in | Wt 316.0 lb

## 2020-02-11 DIAGNOSIS — M25551 Pain in right hip: Secondary | ICD-10-CM | POA: Diagnosis not present

## 2020-02-11 MED ORDER — METHYLPREDNISOLONE ACETATE 80 MG/ML IJ SUSP
80.0000 mg | Freq: Once | INTRAMUSCULAR | Status: AC
  Administered 2020-02-11: 80 mg via INTRAMUSCULAR

## 2020-02-11 NOTE — Telephone Encounter (Signed)
  Care Management   Outreach Note  02/11/2020 Name: Rebekah Howard MRN: [id number] DOB: 1960/12/16  Referred by: Jeralyn Bennett, MD Reason for referral : Care Coordination (HTN, HLD)   An unsuccessful telephone outreach was attempted today. The patient was referred to the case management team for assistance with care management and care coordination.   Follow Up Plan: A HIPPA compliant phone message was left for the patient providing contact information and requesting a return call.  The care management team will reach out to the patient again over the next 7-10 days.   Kelli Churn RN, CCM, [facility] RN Care Manager [phone]

## 2020-02-11 NOTE — Progress Notes (Signed)
Patient ID: Rebekah Howard, female   DOB: 1960-12-11, 59 y.o.   MRN: [id number]  Patient presents today with persistent right hip pain that she localizes to the groin.  Intra-articular cortisone injection last week was not of any benefit.  She is requesting to see a Psychologist, sport and exercise.  Therefore, we will refer her to Dr. Ninfa Linden for his opinion.  In the meantime I did offer a simple IM  Depo-Medrol injection today for her acute pain.

## 2020-02-12 ENCOUNTER — Ambulatory Visit: Payer: Medicaid Other

## 2020-02-12 DIAGNOSIS — E785 Hyperlipidemia, unspecified: Secondary | ICD-10-CM

## 2020-02-12 DIAGNOSIS — I10 Essential (primary) hypertension: Secondary | ICD-10-CM

## 2020-02-12 NOTE — Chronic Care Management (AMB) (Signed)
  Chronic Care Management   Outreach Note  02/12/2020 Name: Rebekah Howard MRN: [id number] DOB: 1960-09-22  Referred by: Jeralyn Bennett, MD Reason for referral : Care Coordination (Housing resources)   Contacted patient today to ensure receipt of housing resources that were mailed on 01/27/20.  Patient was unsure if she has received resources and stated that she was at the store at the time of our call.  Patient stated she would call back but have not received return call as of yet.   Ronn Melena, [place] Coordination Social Worker [place] [phone]

## 2020-02-14 ENCOUNTER — Ambulatory Visit: Payer: Medicaid Other | Admitting: *Deleted

## 2020-02-14 DIAGNOSIS — M25551 Pain in right hip: Secondary | ICD-10-CM

## 2020-02-14 DIAGNOSIS — Z6841 Body Mass Index (BMI) 40.0 and over, adult: Secondary | ICD-10-CM

## 2020-02-14 DIAGNOSIS — I10 Essential (primary) hypertension: Secondary | ICD-10-CM

## 2020-02-14 DIAGNOSIS — E785 Hyperlipidemia, unspecified: Secondary | ICD-10-CM

## 2020-02-14 NOTE — Patient Instructions (Signed)
Visit Information I hope you are able to go to your daughter's wedding and enjoy it. Please keep your appointment with the orthopedic surgeon on 9/20.   Goals Addressed              This Visit's Progress     Patient Stated   .  " I'd like to start checking my blood pressure at home." (pt-stated)        CARE PLAN ENTRY (see longitudinal plan of care for additional care plan information)  Objective:  . Last practice recorded BP readings:  . BP Readings from Last 3 Encounters: .  01/20/20 . 123/62 .  01/08/20 . 134/89 .  01/05/20 . (!) 161/85   . Most recent eGFR/CrCl: No results found for: EGFR  No components found for: CRCL  Current Barriers:  Marland Kitchen Knowledge Deficits related to basic understanding of hypertension pathophysiology and self care management- spoke with patient to complete follow up assessment , she states the depomedrol injection she received on 9/7 by sports medicine MD did not help her hip pain and that she will see Dr Ninfa Linden for surgical consult on 9/20; she still plans to attend her daughter's wedding later this month. She states she has not contacted [employer] about the contracted supplier for home BP monitors, she says she is still working with the CCM BSW to find another place to live since she is having great difficulty navigating the 18 steps to her apartment  Case Manager Clinical Goal(s):  Marland Kitchen Over the next 30-60  days, patient will demonstrate improved adherence to prescribed treatment plan for hypertension as evidenced by taking all medications as prescribed, monitoring and recording blood pressure as directed if her health plan covers the cost of a home BP monitor, adhering to low sodium/DASH diet  Interventions:  . Inter-disciplinary care team collaboration (see longitudinal plan of care)- patient is working with CCM BSW to investigate housing options  . Evaluation of current treatment plan related to HTN and HLD and patient's adherence to plan as  established by provider. . Reviewed medications with patient and discussed medication taking behavior . Provided education to patient about basic HTN and HLD disease process . Again encouraged patient to contact customer service at [facility] to determine if she has a benefit to cover home BP monitor and the name of in -network DME suppliers and then to contact this CCM RN to assist with securing home BP monitor . Discussed plans with patient for ongoing care management follow up and ensured patient has contact number for CCM team and clinic . Reviewed scheduled/upcoming provider appointments including: appointment with Dr Ninfa Linden on 02/24/20   Patient Self Care Activities:  . UNABLE to independently secure home BP monitor and self monitor blood pressure . Self administers medications as prescribed . Attends all scheduled provider appointments . Calls provider office for new concerns, questions, or BP outside discussed parameters  Please see past updates related to this goal by clicking on the "Past Updates" button in the selected goal       .  "I'd like to know how to bring down my triglycerides." (pt-stated)        CARE PLAN ENTRY (see longitudinal plan of care for additional care plan information)  Current Barriers:  . Chronic Disease Management support and education needs related to HLD - spoke with patient o complete follow up assessment .  Nurse Case Manager Clinical Goal(s):  Marland Kitchen Over the next 30-60 days, patient will demonstrate improved  health management independence as evidenced by reviewing  educational information mailed to her and voicing how to reduce triglycerides via lifestyle changes  Interventions:  . Inter-disciplinary care team collaboration (see longitudinal plan of care) . Provided time for patient to ask questions about the written educational information that was mailed to her "Cholesterol and Triglycerides"- that discuss how to lower triglycerides and increase  HDL via lifestyle changes . Discussed plans with patient for ongoing care management follow up and provided patient with direct contact information for care management team  Patient Self Care Activities:  . Patient verbalizes understanding of plan to work with CCM team to improve triglycerides . Self administers medications as prescribed . Attends all scheduled provider appointments . Calls pharmacy for medication refills . Performs ADL's independently . Performs IADL's independently . Calls provider office for new concerns or questions . Unable to independently lower triglycerides   Please see past updates related to this goal by clicking on the "Past Updates" button in the selected goal         The patient verbalized understanding of instructions provided today and declined a print copy of patient instruction materials.   The care management team will reach out to the patient again over the next 30-60 days.   Kelli Churn RN, CCM, [facility] RN Care Manager [phone]

## 2020-02-14 NOTE — Chronic Care Management (AMB) (Signed)
Care Management   Follow Up Note   02/14/2020 Name: Rebekah Howard MRN: [id number] DOB: [birth year]  Referred by: Jeralyn Bennett, MD Reason for referral : Care Coordination (HTN, HLD, hip and back pain)   Rebekah Howard is a 59 y.o. year old female who is a primary care patient of Jeralyn Bennett, MD. The care management team was consulted for assistance with care management and care coordination needs.    Review of patient status, including review of consultants reports, relevant laboratory and other test results, and collaboration with appropriate care team members and the patient's provider was performed as part of comprehensive patient evaluation and provision of chronic care management services.    SDOH (Social Determinants of Health) assessments performed: No See Care Plan activities for detailed interventions related to [facility])     Advanced Directives: See Care Plan and Vynca application for related entries.   Goals Addressed              This Visit's Progress     Patient Stated   .  " I'd like to start checking my blood pressure at home." (pt-stated)        CARE PLAN ENTRY (see longitudinal plan of care for additional care plan information)  Objective:  . Last practice recorded BP readings:  . BP Readings from Last 3 Encounters:  01/20/20 . 123/62  01/08/20 . 134/89  01/05/20 . (!) 161/85   . Most recent eGFR/CrCl: No results found for: EGFR  No components found for: CRCL  Current Barriers:  Marland Kitchen Knowledge Deficits related to basic understanding of hypertension pathophysiology and self care management- spoke with patient to complete follow up assessment , she states the depomedrol injection she received on 9/7 by sports medicine MD did not help her hip pain and that she will see Dr Ninfa Linden for surgical consult on 9/20; she still plans to attend her daughter's wedding later this month. She states she has not contacted [employer] about the contracted  supplier for home BP monitors, she says she is still working with the CCM BSW to find another place to live since she is having great difficulty navigating the 18 steps to her apartment  Case Manager Clinical Goal(s):  Marland Kitchen Over the next 30-60  days, patient will demonstrate improved adherence to prescribed treatment plan for hypertension as evidenced by taking all medications as prescribed, monitoring and recording blood pressure as directed if her health plan covers the cost of a home BP monitor, adhering to low sodium/DASH diet  Interventions:  . Inter-disciplinary care team collaboration (see longitudinal plan of care)- patient is working with CCM BSW to investigate housing options  . Evaluation of current treatment plan related to HTN and HLD and patient's adherence to plan as established by provider. . Reviewed medications with patient and discussed medication taking behavior . Provided education to patient about basic HTN and HLD disease process- provided time for patient to ask questions about educational material that was mailed to her on 01/13/20 related to HTN and HLD self management strategies . Again encouraged patient to contact customer service at [facility] to determine if she has a benefit to cover home BP monitor and the name of in -network DME suppliers and then to contact this CCM RN to assist with securing home BP monitor . Discussed plans with patient for ongoing care management follow up and ensured patient has contact number for CCM team and clinic . Reviewed scheduled/upcoming provider appointments including: appointment with Dr Ninfa Linden on  02/24/20   Patient Self Care Activities:  . UNABLE to independently secure home BP monitor and self monitor blood pressure . Self administers medications as prescribed . Attends all scheduled provider appointments . Calls provider office for new concerns, questions, or BP outside discussed parameters  Please see past updates related to  this goal by clicking on the "Past Updates" button in the selected goal       .  "I'd like to know how to bring down my triglycerides." (pt-stated)        CARE PLAN ENTRY (see longitudinal plan of care for additional care plan information)  Current Barriers:  . Chronic Disease Management support and education needs related to HLD - spoke with patient o complete follow up assessment .  Nurse Case Manager Clinical Goal(s):  Marland Kitchen Over the next 30-60 days, patient will demonstrate improved health management independence as evidenced by reviewing  educational information mailed to her and voicing how to reduce triglycerides via lifestyle changes  Interventions:  . Inter-disciplinary care team collaboration (see longitudinal plan of care) . Provided time for patient to ask questions about the written educational information that was mailed to her "Cholesterol and Triglycerides"- that discuss how to lower triglycerides and increase HDL via lifestyle changes . Discussed plans with patient for ongoing care management follow up and provided patient with direct contact information for care management team  Patient Self Care Activities:  . Patient verbalizes understanding of plan to work with CCM team to improve triglycerides . Self administers medications as prescribed . Attends all scheduled provider appointments . Calls pharmacy for medication refills . Performs ADL's independently . Performs IADL's independently . Calls provider office for new concerns or questions . Unable to independently lower triglycerides   Please see past updates related to this goal by clicking on the "Past Updates" button in the selected goal          The care management team will reach out to the patient again over the next 30-60 days.   Kelli Churn RN, CCM, [facility] RN Care Manager [phone]

## 2020-02-19 DIAGNOSIS — M5136 Other intervertebral disc degeneration, lumbar region: Secondary | ICD-10-CM | POA: Diagnosis not present

## 2020-02-19 DIAGNOSIS — M9903 Segmental and somatic dysfunction of lumbar region: Secondary | ICD-10-CM | POA: Diagnosis not present

## 2020-02-19 DIAGNOSIS — M9905 Segmental and somatic dysfunction of pelvic region: Secondary | ICD-10-CM | POA: Diagnosis not present

## 2020-02-19 DIAGNOSIS — M9904 Segmental and somatic dysfunction of sacral region: Secondary | ICD-10-CM | POA: Diagnosis not present

## 2020-02-24 ENCOUNTER — Ambulatory Visit (INDEPENDENT_AMBULATORY_CARE_PROVIDER_SITE_OTHER): Payer: Medicaid Other | Admitting: Orthopaedic Surgery

## 2020-02-24 ENCOUNTER — Encounter: Payer: Self-pay | Admitting: Orthopaedic Surgery

## 2020-02-24 VITALS — Ht 66.0 in | Wt 316.0 lb

## 2020-02-24 DIAGNOSIS — M5441 Lumbago with sciatica, right side: Secondary | ICD-10-CM

## 2020-02-24 DIAGNOSIS — G8929 Other chronic pain: Secondary | ICD-10-CM

## 2020-02-24 DIAGNOSIS — M25551 Pain in right hip: Secondary | ICD-10-CM | POA: Diagnosis not present
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
## 2020-02-25 ENCOUNTER — Other Ambulatory Visit: Payer: Self-pay

## 2020-02-25 DIAGNOSIS — M25551 Pain in right hip: Secondary | ICD-10-CM

## 2020-02-25 DIAGNOSIS — M4807 Spinal stenosis, lumbosacral region: Secondary | ICD-10-CM

## 2020-02-28 ENCOUNTER — Ambulatory Visit: Payer: Medicaid Other

## 2020-02-28 DIAGNOSIS — M25551 Pain in right hip: Secondary | ICD-10-CM

## 2020-02-28 DIAGNOSIS — I10 Essential (primary) hypertension: Secondary | ICD-10-CM

## 2020-02-28 DIAGNOSIS — E785 Hyperlipidemia, unspecified: Secondary | ICD-10-CM

## 2020-02-28 NOTE — Chronic Care Management (AMB) (Signed)
Care Management   Follow Up Note   02/28/2020 Name: Tolulope Pinkett MRN: [id number] DOB: [birth year]  Referred by: Jeralyn Bennett, MD Reason for referral : Care Coordination (housing)   Rebekah Howard is a 59 y.o. year old female who is a primary care patient of Jeralyn Bennett, MD. The care management team was consulted for assistance with care management and care coordination needs.    Review of patient status, including review of consultants reports, relevant laboratory and other test results, and collaboration with appropriate care team members and the patient's provider was performed as part of comprehensive patient evaluation and provision of chronic care management services.    SDOH (Social Determinants of Health) assessments performed: No See Care Plan activities for detailed interventions related to [facility])     Advanced Directives: See Care Plan and Vynca application for related entries.   Goals Addressed              This Visit's Progress     "I'm not safe in the apartment where I am right now; I'd like to talk with a Education officer, museum about it." (pt-stated)        [place] (see longitudinal plan of care for additional care plan information)   Current Barriers:   Chronic Disease Management support, education, and care coordination needs related to HTN, HLD, and Hypertriglyceridemia- met with patient during her acute clinic appointment to introduce CCM RN and for her signature on section 8 housing form per CCM BSW's request.  Patient states she is fearful that she will not be able to  attend her daughter's wedding on 02/09/20 if her leg and back pain do not improve  02/28/20:  Patient states today that she has not had the opportunity to actively research housing options due to dealing with pain and planning of wedding for her daughter. Patient is very pleased that MRI has been recommended for ongoing hip pain  Case Manager Clinical Goal(s):   Over the next  30-90 days, patient will work with BSW to address needs related to Housing barriers in patient with HTN, HLD, and Hypertriglyceridemia  Interventions:   Confirmed that patient received list of housing resources.  Informed patient that list of properties outside of [facility] can be sent to her as she stated that properties she is interested in are not accepting applications  Encouraged patient to consider being added to wait lists because it is very likely that all low income properties, even outside of [facility], have wait list     Patient Self Care Activities:   Patient verbalizes understanding of plan to work with BSW to address housing concerns  Please see past updates related to this goal by clicking on the "Past Updates" button in the selected goal          The care management team will reach out to the patient again over the next 30 days.   Ronn Melena, [place] Coordination Social Worker [facility] [phone]

## 2020-02-28 NOTE — Patient Instructions (Signed)
Visit Information  Goals Addressed              This Visit's Progress   .  "I'm not safe in the apartment where I am right now; I'd like to talk with a Education officer, museum about it." (pt-stated)        [place] (see longitudinal plan of care for additional care plan information)   Current Barriers:  . Chronic Disease Management support, education, and care coordination needs related to HTN, HLD, and Hypertriglyceridemia- met with patient during her acute clinic appointment to introduce CCM RN and for her signature on section 8 housing form per CCM BSW's request.  Patient states she is fearful that she will not be able to  attend her daughter's wedding on 02/09/20 if her leg and back pain do not improve . 02/28/20:  Patient states today that she has not had the opportunity to actively research housing options due to dealing with pain and planning of wedding for her daughter. Patient is very pleased that MRI has been recommended for ongoing hip pain  Case Manager Clinical Goal(s):  Marland Kitchen Over the next 30-90 days, patient will work with BSW to address needs related to Housing barriers in patient with HTN, HLD, and Hypertriglyceridemia  Interventions:  . Confirmed that patient received list of housing resources. . Informed patient that list of properties outside of [facility] can be sent to her as she stated that properties she is interested in are not accepting applications . Encouraged patient to consider being added to wait lists because it is very likely that all low income properties, even outside of [place], have wait list  .  . Patient Self Care Activities:  . Patient verbalizes understanding of plan to work with BSW to address housing concerns  Please see past updates related to this goal by clicking on the "Past Updates" button in the selected goal         Patient verbalizes understanding of instructions provided today.   The care management team will reach out to the patient  again over the next 30 days.    Ronn Melena, [place] Coordination Social Worker [place] [phone]

## 2020-03-10 ENCOUNTER — Telehealth: Payer: Medicaid Other

## 2020-03-12 ENCOUNTER — Ambulatory Visit: Payer: Medicaid Other | Admitting: *Deleted

## 2020-03-12 DIAGNOSIS — R7303 Prediabetes: Secondary | ICD-10-CM

## 2020-03-12 DIAGNOSIS — E785 Hyperlipidemia, unspecified: Secondary | ICD-10-CM

## 2020-03-12 DIAGNOSIS — I10 Essential (primary) hypertension: Secondary | ICD-10-CM

## 2020-03-12 NOTE — Chronic Care Management (AMB) (Signed)
  Care Management   Note  03/12/2020 Name: Alyric Parkin MRN: [id number] DOB: 1960-12-06  Successful outreach to patient to complete follow up assessment. Patient requests return call next week.   Telephone follow up appointment with care management team member scheduled for:03/17/20 at 9:00 am  Kelli Churn RN, CCM, [facility] RN Care Manager [phone]

## 2020-03-15 ENCOUNTER — Other Ambulatory Visit: Payer: Medicaid Other

## 2020-03-17 ENCOUNTER — Telehealth: Payer: Medicaid Other

## 2020-03-18 ENCOUNTER — Telehealth: Payer: Medicaid Other

## 2020-03-20 ENCOUNTER — Ambulatory Visit: Payer: Medicaid Other | Admitting: *Deleted

## 2020-03-20 ENCOUNTER — Telehealth: Payer: Medicaid Other

## 2020-03-20 ENCOUNTER — Telehealth: Payer: Self-pay | Admitting: *Deleted

## 2020-03-20 DIAGNOSIS — I10 Essential (primary) hypertension: Secondary | ICD-10-CM

## 2020-03-20 DIAGNOSIS — E785 Hyperlipidemia, unspecified: Secondary | ICD-10-CM

## 2020-03-20 NOTE — Telephone Encounter (Signed)
  Care Management   Outreach Note  03/20/2020 Name: Rebekah Howard MRN: [id number] DOB: 03-Dec-1960  Referred by: Jeralyn Bennett, MD Reason for referral : Care Coordination (HTN, HLD, hip and back pain)   An unsuccessful telephone outreach was attempted today. The patient was referred to the case management team for assistance with care management and care coordination.   Follow Up Plan: A HIPAA compliant phone message was left for the patient providing contact information and requesting a return call.  The care management team will reach out to the patient again over the next 7-10 days.   Kelli Churn RN, CCM, [facility] RN Care Manager [phone]

## 2020-03-20 NOTE — Chronic Care Management (AMB) (Signed)
Care Management   Follow Up Note   03/20/2020 Name: Rebekah Howard MRN: [id number] DOB: 10-Jun-1960  Referred by: Jeralyn Bennett, MD Reason for referral : Care Coordination (HTN, HLD, hip and back pain)   Rebekah Howard is a 59 y.o. year old female who is a primary care patient of Jeralyn Bennett, MD. The care management team was consulted for assistance with care management and care coordination needs.    Review of patient status, including review of consultants reports, relevant laboratory and other test results, and collaboration with appropriate care team members and the patient's provider was performed as part of comprehensive patient evaluation and provision of chronic care management services.    SDOH (Social Determinants of Health) assessments performed: No See Care Plan activities for detailed interventions related to [facility])     Advanced Directives: See Care Plan and Vynca application for related entries.   Goals Addressed              This Visit's Progress     Patient Stated   .  COMPLETED: " I'd like to start checking my blood pressure at home." (pt-stated)        CARE PLAN ENTRY (see longitudinal plan of care for additional care plan information)  Objective:  . Last practice recorded BP readings:  BP Readings from Last 3 Encounters:  02/11/20 138/80  01/28/20 (!) 141/82  01/24/20 (!) 123/95    . Most recent eGFR/CrCl: No results found for: EGFR  No components found for: CRCL  Current Barriers:  Marland Kitchen Knowledge Deficits related to basic understanding of hypertension pathophysiology and self care management- spoke with patient to complete follow up assessment , she is requesting to stop care management services, states " my focus right now is finding another place to live because I can't  get up the 18 steps to my apartment" . Says she was able to attended her daughter's wedding last month and she has follow up with Dr Ninfa Linden to address her right hip  and back pain after she has MRI of her right hip and spine later this month Case Manager Clinical Goal(s):  Marland Kitchen Over the next 30-60  days, patient will demonstrate improved adherence to prescribed treatment plan for hypertension as evidenced by taking all medications as prescribed, monitoring and recording blood pressure as directed if her health plan covers the cost of a home BP monitor, adhering to low sodium/DASH diet  Interventions:  . Reminded patient she may re-enroll in care management services at any time by calling the clinic or a CCM team member   Patient Self Care Activities:  . UNABLE to independently secure home BP monitor and self monitor blood pressure . Self administers medications as prescribed . Attends all scheduled provider appointments . Calls provider office for new concerns, questions, or BP outside discussed parameters  Please see past updates related to this goal by clicking on the "Past Updates" button in the selected goal       .  COMPLETED: "I'd like to know how to bring down my triglycerides." (pt-stated)        CARE PLAN ENTRY (see longitudinal plan of care for additional care plan information)  Current Barriers:  . Chronic Disease Management support and education needs related to HLD - spoke with patient to complete follow up assessment, she is requesting that care management services stop .  Nurse Case Manager Clinical Goal(s):  Marland Kitchen Over the next 30-60 days, patient will demonstrate improved health management independence as evidenced  by reviewing  educational information mailed to her and voicing how to reduce triglycerides via lifestyle changes  Interventions:  . Reminded patient that she may re-enroll in the care management program at any time by calling the clinic or a CCM team member  Patient Self Care Activities:  . Patient verbalizes understanding of plan to work with CCM team to improve triglycerides . Self administers medications as  prescribed . Attends all scheduled provider appointments . Calls pharmacy for medication refills . Performs ADL's independently . Performs IADL's independently . Calls provider office for new concerns or questions . Unable to independently lower triglycerides   Please see past updates related to this goal by clicking on the "Past Updates" button in the selected goal      .  COMPLETED: "I'm not safe in the apartment where I am right now; I'd like to talk with a Education officer, museum about it." (pt-stated)        [place] (see longitudinal plan of care for additional care plan information)   Current Barriers:  . Chronic Disease Management support, education, and care coordination needs related to HTN, HLD, and Hypertriglyceridemia- met with patient during her acute clinic appointment to introduce CCM RN and for her signature on section 8 housing form per CCM BSW's request.  Patient states she is fearful that she will not be able to  attend her daughter's wedding on 02/09/20 if her leg and back pain do not improve . 02/28/20:  Patient states today that she has not had the opportunity to actively research housing options due to dealing with pain and planning of wedding for her daughter. Patient is very pleased that MRI has been recommended for ongoing hip pain . 10/15- patient requesting that care management services stop  Case Manager Clinical Goal(s):  Marland Kitchen Over the next 30-90 days, patient will work with BSW to address needs related to Housing barriers in patient with HTN, HLD, and Hypertriglyceridemia  Interventions:  . Reminded patient hat she may re-enroll in the care management program at any time by calling the clinic or a CCM team member  .  Marland Kitchen Patient Self Care Activities:  . Patient verbalizes understanding of plan to work with BSW to address housing concerns  Please see past updates related to this goal by clicking on the "Past Updates" button in the selected goal          CCM  enrollment status changed to "previously enrolled" as per patient request on 03/20/20 to discontinue enrollment. Case closed to case management services in primary care home.   Kelli Churn RN, CCM, [facility] RN Care Manager [phone]

## 2020-03-27 ENCOUNTER — Telehealth: Payer: Medicaid Other

## 2020-04-02 ENCOUNTER — Ambulatory Visit
Admission: RE | Admit: 2020-04-02 | Discharge: 2020-04-02 | Disposition: A | Discharge status: Home / Self Care | Payer: Medicaid Other | Source: Ambulatory Visit | Attending: Orthopaedic Surgery | Admitting: Orthopaedic Surgery

## 2020-04-02 ENCOUNTER — Other Ambulatory Visit: Payer: Self-pay

## 2020-04-02 DIAGNOSIS — M1611 Unilateral primary osteoarthritis, right hip: Secondary | ICD-10-CM | POA: Diagnosis not present

## 2020-04-02 DIAGNOSIS — M48061 Spinal stenosis, lumbar region without neurogenic claudication: Secondary | ICD-10-CM | POA: Diagnosis not present

## 2020-04-02 DIAGNOSIS — M5116 Intervertebral disc disorders with radiculopathy, lumbar region: Secondary | ICD-10-CM | POA: Diagnosis not present

## 2020-04-02 DIAGNOSIS — M4726 Other spondylosis with radiculopathy, lumbar region: Secondary | ICD-10-CM | POA: Diagnosis not present

## 2020-04-02 DIAGNOSIS — R200 Anesthesia of skin: Secondary | ICD-10-CM | POA: Diagnosis not present

## 2020-04-02 DIAGNOSIS — M5117 Intervertebral disc disorders with radiculopathy, lumbosacral region: Secondary | ICD-10-CM | POA: Diagnosis not present

## 2020-04-02 DIAGNOSIS — M4807 Spinal stenosis, lumbosacral region: Secondary | ICD-10-CM

## 2020-04-02 DIAGNOSIS — M25551 Pain in right hip: Secondary | ICD-10-CM

## 2020-04-09 ENCOUNTER — Encounter: Payer: Self-pay | Admitting: Orthopaedic Surgery

## 2020-04-09 ENCOUNTER — Ambulatory Visit (INDEPENDENT_AMBULATORY_CARE_PROVIDER_SITE_OTHER): Payer: Medicaid Other | Admitting: Orthopaedic Surgery

## 2020-04-09 DIAGNOSIS — M5441 Lumbago with sciatica, right side: Secondary | ICD-10-CM | POA: Diagnosis not present

## 2020-04-09 DIAGNOSIS — G8929 Other chronic pain: Secondary | ICD-10-CM

## 2020-04-09 NOTE — Addendum Note (Signed)
Addended by: Robyne Peers on: 04/09/2020 01:35 PM   Modules accepted: Orders

## 2020-04-09 NOTE — Progress Notes (Signed)
She is here for follow-up after having MRIs of both her right hip and her lumbar spine.  It was difficult to tell the source of her pain.  She had a hard mechanical fall earlier this year.  X-rays were inconclusive for any type of fracture but she is morbidly obese and has a large soft tissue envelope obscuring the x-rays of the hip.  She was having some hip and groin pain but more so pain at her right gluteal area and axillary and sciatic area that radiates down her leg.  She is reporting numbness and tingling as well.  She is walking today without a limp or an assistive device.  She seems to be comfortable in appearance.  MRI is reviewed with her and it does show a subarticular disc protrusion to the right side at V6-F5 that certainly could be affecting the nerves in that area especially S1.  She also has some moderate stenosis at the level above.  The MRI of her hip showed just some mild degenerative changes in no acute findings no evidence of fracture.  I stressed to her again the importance of weight loss.  We will recommend outpatient physical therapy to work on any modalities that can decrease her back pain including contraction.  She is interested in an epidural steroid injection if this is possible to the right side at L5-S1.  We will see if we can get that set up to Dr. Ernestina Patches as well.  I showed her back model and explained in detail what these things mean and gave her a copy of her MRI reports.  I will see her back in 4 weeks to see how she is doing overall.  All questions and concerns were answered and addressed.

## 2020-04-21 ENCOUNTER — Encounter: Payer: Self-pay | Admitting: Internal Medicine

## 2020-04-21 ENCOUNTER — Ambulatory Visit (INDEPENDENT_AMBULATORY_CARE_PROVIDER_SITE_OTHER): Payer: Medicaid Other | Admitting: Internal Medicine

## 2020-04-21 VITALS — BP 123/87 | HR 79 | Temp 97.9°F | Ht 66.0 in | Wt 329.0 lb

## 2020-04-21 DIAGNOSIS — R7303 Prediabetes: Secondary | ICD-10-CM | POA: Diagnosis not present

## 2020-04-21 DIAGNOSIS — R778 Other specified abnormalities of plasma proteins: Secondary | ICD-10-CM

## 2020-04-21 DIAGNOSIS — I10 Essential (primary) hypertension: Secondary | ICD-10-CM

## 2020-04-21 DIAGNOSIS — E782 Mixed hyperlipidemia: Secondary | ICD-10-CM | POA: Diagnosis not present

## 2020-04-21 LAB — POCT GLYCOSYLATED HEMOGLOBIN (HGB A1C): Hemoglobin A1C: 5.6 % (ref 4.0–5.6)

## 2020-04-21 LAB — GLUCOSE, CAPILLARY: Glucose-Capillary: 83 mg/dL (ref 70–99)

## 2020-04-21 MED ORDER — LISINOPRIL-HYDROCHLOROTHIAZIDE 20-25 MG PO TABS: 1.0000 | ORAL_TABLET | Freq: Every day | ORAL | 3 refills | Status: DC

## 2020-04-21 NOTE — Assessment & Plan Note (Signed)
Rebekah Howard states that she has been donating plasma twice a week for a long time now as a source of income.  She states her plasma center usually tests her blood every 4 months and her results have always been normal, however at her most recent blood test, she was told she has abnormal results.  She brought her paperwork with her.  She denies any headaches, dizziness.  She denies any history of multiple myeloma or family history of MM.  She denies any known history of hepatic disorder with renal failure.  Assessment/plan: Paperwork that Rebekah Howard brought with her today indicates abnormal results on SPEP by the plasma center.  I am not quite sure why they are checking SPEP, however patient has been told she cannot get plasma any longer until she is cleared.  We will go ahead and check a CMP for protein and albumin today and an SPEP as well.  Given that she is regularly donating plasma, will check a CBC, ferritin, and iron panel as well given her high risk anemia.  -CMP, SPEP, CBC, ferritin, iron panel pending -We will need to send results to patient via mail once finalized
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
## 2020-04-21 NOTE — Assessment & Plan Note (Signed)
Rebekah Howard states that she has been taking her Metformin once a day without any difficulty.  She would like her A1c checked today she is concerned about her development of diabetes in the future.  She is also worried about it in light of her recent 16 pound weight gain.  Assessment/plan: A1c checked today and patient remains borderline between normal/prediabetes range.  Recommend that she continue with the Metformin daily given her weight gain over the last 6 to 7 weeks.  We will continue to check periodically.  -Continue Metformin 500 mg once per day -Counseled on importance of weight loss, healthy dieting, and exercise

## 2020-04-21 NOTE — Patient Instructions (Addendum)
It was nice seeing you today! Thank you for choosing [facility] for your Primary Care.    Today we talked about:   1. Plasma center: We do some blood work today. I will call you with the results.   2. Pre-Diabetes: Your a1c today just at the cut off between Normal and Pre-Diabetes. Continue working on weight loss and taking Metformin once per day.   3. Cholesterol Medication: I would recommend continuing with your cholesterol medication. If it becomes too burdensome though, so we can make some adjustments.   4. Blood Pressure: Your blood pressure today is great!

## 2020-04-21 NOTE — Assessment & Plan Note (Signed)
Ms. Goral denies any difficulty with her blood pressure medications states she has been on it for a long time.  She denies any chest pain, shortness of breath, leg swelling, dizziness, headache.  Assessment/plan: BP: 123/87   Well-controlled today on current regimen.  No medication changes made.  -Refilled Zestoretic

## 2020-04-21 NOTE — Assessment & Plan Note (Signed)
Rebekah Howard states that she has been taking her atorvastatin daily without any difficulty, but she does wish that she can get off the medication to reduce her pill burden.  She denies any muscle aches.  Assessment/plan: ASCVD risk score of approximately 5%, however I suspect that this is secondary to her being on high intensity statin.  In the future, we could consider de-escalating to a moderate intensity, however given that patient is tolerating this medication well, I encouraged her that she continue it at this time.  We discussed her ASCVD risk score and the benefit of her statin, and Rebekah Howard is in agreement to continue.    -Continue atorvastatin 40 mg daily

## 2020-04-22 ENCOUNTER — Telehealth: Payer: Self-pay

## 2020-04-22 LAB — CMP14 + ANION GAP
ALT: 24 IU/L (ref 0–32)
AST: 21 IU/L (ref 0–40)
Albumin/Globulin Ratio: 1.8 (ref 1.2–2.2)
Albumin: 4 g/dL (ref 3.8–4.9)
Alkaline Phosphatase: 94 IU/L (ref 44–121)
Anion Gap: 13 mmol/L (ref 10.0–18.0)
BUN/Creatinine Ratio: 22 (ref 9–23)
BUN: 17 mg/dL (ref 6–24)
Bilirubin Total: 0.6 mg/dL (ref 0.0–1.2)
CO2: 25 mmol/L (ref 20–29)
Calcium: 9.5 mg/dL (ref 8.7–10.2)
Chloride: 102 mmol/L (ref 96–106)
Creatinine, Ser: 0.77 mg/dL (ref 0.57–1.00)
GFR calc Af Amer: 98 mL/min/{1.73_m2} (ref >59)
GFR calc non Af Amer: 85 mL/min/{1.73_m2} (ref >59)
Globulin, Total: 2.2 g/dL (ref 1.5–4.5)
Glucose: 80 mg/dL (ref 65–99)
Potassium: 3.9 mmol/L (ref 3.5–5.2)
Sodium: 140 mmol/L (ref 134–144)
Total Protein: 6.2 g/dL (ref 6.0–8.5)

## 2020-04-22 LAB — CBC
Hematocrit: 43.3 % (ref 34.0–46.6)
Hemoglobin: 15 g/dL (ref 11.1–15.9)
MCH: 31.3 pg (ref 26.6–33.0)
MCHC: 34.6 g/dL (ref 31.5–35.7)
MCV: 90 fL (ref 79–97)
Platelets: 282 10*3/uL (ref 150–450)
RBC: 4.8 x10E6/uL (ref 3.77–5.28)
RDW: 14.4 % (ref 11.7–15.4)
WBC: 5.3 10*3/uL (ref 3.4–10.8)

## 2020-04-22 LAB — PROTEIN ELECTROPHORESIS, SERUM
A/G Ratio: 1 (ref 0.7–1.7)
Albumin ELP: 3.1 g/dL (ref 2.9–4.4)
Alpha 1: 0.3 g/dL (ref 0.0–0.4)
Alpha 2: 0.9 g/dL (ref 0.4–1.0)
Beta: 1.2 g/dL (ref 0.7–1.3)
Gamma Globulin: 0.8 g/dL (ref 0.4–1.8)
Globulin, Total: 3.1 g/dL (ref 2.2–3.9)

## 2020-04-22 LAB — IRON AND TIBC
Iron Saturation: 23 % (ref 15–55)
Iron: 72 ug/dL (ref 27–159)
Total Iron Binding Capacity: 310 ug/dL (ref 250–450)
UIBC: 238 ug/dL (ref 131–425)

## 2020-04-22 LAB — FERRITIN: Ferritin: 61 ng/mL (ref 15–150)

## 2020-04-22 NOTE — Progress Notes (Signed)
Internal Medicine Clinic Attending  Case discussed with Dr. Basaraba  At the time of the visit.  We reviewed the resident's history and exam and pertinent patient test results.  I agree with the assessment, diagnosis, and plan of care documented in the resident's note.  

## 2020-04-22 NOTE — Telephone Encounter (Signed)
Requesting lab results, please call pt back.  

## 2020-04-22 NOTE — Addendum Note (Signed)
Addended by: Lalla Brothers T on: 04/22/2020 09:32 AM   Modules accepted: Level of Service

## 2020-04-23 NOTE — Telephone Encounter (Signed)
Patient called back and informed her labs were normal. In order for her to continue giving plasma, the [facility] plasma medical staff would need clearance. The patient is requesting we fax the protein analysis results to the [facility] plasma medical staff (fax # [phone]) .   Thank you  Dr. Johnney Ou

## 2020-05-07 ENCOUNTER — Ambulatory Visit (INDEPENDENT_AMBULATORY_CARE_PROVIDER_SITE_OTHER): Payer: Medicaid Other | Admitting: Student

## 2020-05-07 ENCOUNTER — Encounter: Payer: Self-pay | Admitting: Student

## 2020-05-07 DIAGNOSIS — R778 Other specified abnormalities of plasma proteins: Secondary | ICD-10-CM | POA: Diagnosis not present

## 2020-05-07 NOTE — Patient Instructions (Signed)
Rebekah Howard,  It is a pleasure seeing you today.  I have printed out the results from the protein electrophoresis.  Please bring it to the plasma donator office.  They can call us if they have any questions  For your weight gain, I will obtain TSH to check for the function of the thyroid gland.  Take care  Dr. Alfonse Spruce

## 2020-05-08 ENCOUNTER — Encounter: Payer: Self-pay | Admitting: Student

## 2020-05-08 ENCOUNTER — Telehealth: Payer: Self-pay

## 2020-05-08 LAB — TSH: TSH: 1.07 u[IU]/mL (ref 0.450–4.500)

## 2020-05-08 NOTE — Telephone Encounter (Signed)
Pls contact pt [phone]

## 2020-05-08 NOTE — Assessment & Plan Note (Addendum)
Patient is in the office to discuss her SPEP lab values.  Patient has been donating plasma as an income source for a few years.  Her recent labs at the plasma center shows abnormal alpha value so she was instructed to have a follow-up lab.  Her SPEP lab 04/21/2020 were unremarkable with normal alpha protein value.  CBC, CMP, iron study and ferritin were also within normal limits.  Results was printed out for patient so she can bring it to the plasma center.  Patient can continue donating plasma.  The plasma center can call us if they have any questions or concerns.

## 2020-05-08 NOTE — Telephone Encounter (Signed)
Pt states she is coming back to clinic this am She states she needs her results and the doctor didn't fill her paper out that she needs. Will wait for dr Alfonse Spruce to fill out her paper and tell her the results

## 2020-05-08 NOTE — Progress Notes (Signed)
Internal Medicine Clinic Attending  I saw and evaluated the patient.  I personally confirmed the key portions of the history and exam documented by Dr. Nguyen and I reviewed pertinent patient test results.  The assessment, diagnosis, and plan were formulated together and I agree with the documentation in the resident's note.\  

## 2020-05-08 NOTE — Assessment & Plan Note (Addendum)
Patient reports weight gain which she is up 6 pounds since 04/21/2020.  She also complains of exhaustion and feeling cold.  States that she eats two meals a day and denies exercise activities.  Per Dr. Konrad Penta notes in July at 121, patient was prescribed semaglutide for weight loss but it does not appear on the medication list.   Assessment and plan: -Discussed with patient the importance of exercise.  She can start with just walking 30 minutes a day, 2-3 times a week and increase as tolerable. -Given her complains of weight gain, exhaustion and cold feeling, TSH was obtained and came back within normal limits.
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
## 2020-05-11 ENCOUNTER — Telehealth: Payer: Self-pay | Admitting: Physical Medicine and Rehabilitation

## 2020-05-11 NOTE — Telephone Encounter (Signed)
Called pt and resch for 06/16/19.

## 2020-05-11 NOTE — Telephone Encounter (Signed)
Patient called requesting to reschedule appt. Please call patient at [phone]

## 2020-05-12 ENCOUNTER — Ambulatory Visit: Payer: Medicaid Other | Admitting: Physical Medicine and Rehabilitation

## 2020-06-02 ENCOUNTER — Ambulatory Visit (HOSPITAL_COMMUNITY)
Admission: EM | Admit: 2020-06-02 | Discharge: 2020-06-02 | Disposition: A | Discharge status: Home / Self Care | Payer: Medicaid Other

## 2020-06-15 ENCOUNTER — Other Ambulatory Visit: Payer: Self-pay

## 2020-06-15 ENCOUNTER — Encounter: Payer: Self-pay | Admitting: Physical Medicine and Rehabilitation

## 2020-06-15 ENCOUNTER — Ambulatory Visit: Payer: Self-pay

## 2020-06-15 ENCOUNTER — Ambulatory Visit (INDEPENDENT_AMBULATORY_CARE_PROVIDER_SITE_OTHER): Payer: Medicaid Other | Admitting: Physical Medicine and Rehabilitation

## 2020-06-15 VITALS — BP 105/73 | HR 82

## 2020-06-15 DIAGNOSIS — M5416 Radiculopathy, lumbar region: Secondary | ICD-10-CM | POA: Diagnosis not present

## 2020-06-15 MED ORDER — BETAMETHASONE SOD PHOS & ACET 6 (3-3) MG/ML IJ SUSP
12.0000 mg | Freq: Once | INTRAMUSCULAR | Status: AC
  Administered 2020-06-15: 12 mg

## 2020-06-15 NOTE — Procedures (Signed)
Lumbar Epidural Steroid Injection - Interlaminar Approach with Fluoroscopic Guidance  Patient: Rebekah Howard      Date of Birth: 1960-07-01 MRN: [id number] PCP: Jeralyn Bennett, MD      Visit Date: 06/15/2020   Universal Protocol:     Consent Given By: the patient  Position: PRONE  Additional Comments: Vital signs were monitored before and after the procedure. Patient was prepped and draped in the usual sterile fashion. The correct patient, procedure, and site was verified.   Injection Procedure Details:   Procedure diagnoses: Lumbar radiculopathy [M54.16]   Meds Administered:  Meds ordered this encounter  Medications  . betamethasone acetate-betamethasone sodium phosphate (CELESTONE) injection 12 mg     Laterality: Right  Location/Site:  L5-S1  Needle: 4.5 in., 20 ga. Tuohy  Needle Placement: Paramedian epidural  Findings:   -Comments: Excellent flow of contrast into the epidural space.  Procedure Details: Using a paramedian approach from the side mentioned above, the region overlying the inferior lamina was localized under fluoroscopic visualization and the soft tissues overlying this structure were infiltrated with 4 ml. of 1% Lidocaine without Epinephrine. The Tuohy needle was inserted into the epidural space using a paramedian approach.   The epidural space was localized using loss of resistance along with counter oblique bi-planar fluoroscopic views.  After negative aspirate for air, blood, and CSF, a 2 ml. volume of Isovue-250 was injected into the epidural space and the flow of contrast was observed. Radiographs were obtained for documentation purposes.    The injectate was administered into the level noted above.   Additional Comments:  The patient tolerated the procedure well Dressing: 2 x 2 sterile gauze and Band-Aid    Post-procedure details: Patient was observed during the procedure. Post-procedure instructions were reviewed.  Patient left  the clinic in stable condition.

## 2020-06-15 NOTE — Patient Instructions (Signed)

## 2020-06-15 NOTE — Progress Notes (Signed)
Pt state lower back that travels down her right hip and leg. Pt state walking , standing and sitting makes the pain worse. Pt state she just deal with the pain.  Numeric Pain Rating Scale and Functional Assessment Average Pain 8   In the last MONTH (on 0-10 scale) has pain interfered with the following?  1. General activity like being  able to carry out your everyday physical activities such as walking, climbing stairs, carrying groceries, or moving a chair?  Rating(9)   +Driver, -BT, -Dye Allergies.

## 2020-06-15 NOTE — Progress Notes (Signed)
Rebekah Howard - 60 y.o. female MRN [id number]  Date of birth: 1960/12/28  Office Visit Note: Visit Date: 06/15/2020 PCP: Jeralyn Bennett, MD Referred by: Jeralyn Bennett, MD  Subjective: Chief Complaint  Patient presents with  . Lower Back - Pain  . Right Hip - Pain  . Right Leg - Pain   HPI:  Rebekah Howard is a 60 y.o. female who comes in today at the request of Dr. Jean Rosenthal for planned Right L5-S1 Lumbar epidural steroid injection with fluoroscopic guidance.  The patient has failed conservative care including home exercise, medications, time and activity modification.  This injection will be diagnostic and hopefully therapeutic.  Please see requesting physician notes for further details and justification.  MRI reviewed with images and spine model.  MRI reviewed in the note below.    ROS Otherwise per HPI.  Assessment & Plan: Visit Diagnoses:    ICD-10-CM   1. Lumbar radiculopathy  M54.16 XR C-ARM NO REPORT    Epidural Steroid injection    betamethasone acetate-betamethasone sodium phosphate (CELESTONE) injection 12 mg    Plan: No additional findings.   Meds & Orders:  Meds ordered this encounter  Medications  . betamethasone acetate-betamethasone sodium phosphate (CELESTONE) injection 12 mg    Orders Placed This Encounter  Procedures  . XR C-ARM NO REPORT  . Epidural Steroid injection    Follow-up: Return if symptoms worsen or fail to improve.   Procedures: No procedures performed  Lumbar Epidural Steroid Injection - Interlaminar Approach with Fluoroscopic Guidance  Patient: Rebekah Howard      Date of Birth: 1960-11-25 MRN: [id number] PCP: Jeralyn Bennett, MD      Visit Date: 06/15/2020   Universal Protocol:     Consent Given By: the patient  Position: PRONE  Additional Comments: Vital signs were monitored before and after the procedure. Patient was prepped and draped in the usual sterile fashion. The correct patient,  procedure, and site was verified.   Injection Procedure Details:   Procedure diagnoses: Lumbar radiculopathy [M54.16]   Meds Administered:  Meds ordered this encounter  Medications  . betamethasone acetate-betamethasone sodium phosphate (CELESTONE) injection 12 mg     Laterality: Right  Location/Site:  L5-S1  Needle: 4.5 in., 20 ga. Tuohy  Needle Placement: Paramedian epidural  Findings:   -Comments: Excellent flow of contrast into the epidural space.  Procedure Details: Using a paramedian approach from the side mentioned above, the region overlying the inferior lamina was localized under fluoroscopic visualization and the soft tissues overlying this structure were infiltrated with 4 ml. of 1% Lidocaine without Epinephrine. The Tuohy needle was inserted into the epidural space using a paramedian approach.   The epidural space was localized using loss of resistance along with counter oblique bi-planar fluoroscopic views.  After negative aspirate for air, blood, and CSF, a 2 ml. volume of Isovue-250 was injected into the epidural space and the flow of contrast was observed. Radiographs were obtained for documentation purposes.    The injectate was administered into the level noted above.   Additional Comments:  The patient tolerated the procedure well Dressing: 2 x 2 sterile gauze and Band-Aid    Post-procedure details: Patient was observed during the procedure. Post-procedure instructions were reviewed.  Patient left the clinic in stable condition.    Clinical History: MRI LUMBAR SPINE WITHOUT CONTRAST  TECHNIQUE: Multiplanar, multisequence MR imaging of the lumbar spine was performed. No intravenous contrast was administered.  COMPARISON:  Plain films December 20, 2019.  FINDINGS:  Segmentation:  Standard.  Alignment:  Mild anterolisthesis of L4 over L5, degenerative.  Vertebrae:  No fracture, evidence of discitis, or bone lesion.  Conus medullaris and  cauda equina: Conus extends to the L1 level. Conus and cauda equina appear normal.  Paraspinal and other soft tissues: Negative.  Disc levels:  T12-L1: No spinal canal or neural foraminal stenosis.  L1-2: Shallow disc bulge with superimposed right foraminal disc protrusion and facet degenerative changes resulting in moderate right neural foraminal narrowing. No spinal canal stenosis.  L2-3: Shallow disc bulge and prominent facet degenerative changes resulting in mild right neural foraminal narrowing. No spinal canal stenosis.  L3-4: Prominent facet degenerative changes resulting in moderate left neural foraminal narrowing. No spinal canal stenosis.  L4-5: Disc uncovering, prominent hypertrophic facet degenerative changes and ligamentum flavum redundancy resulting in narrowing of the bilateral subarticular zones, right greater than left, and moderate bilateral neural foraminal narrowing.  L5-S1: Right subarticular disc protrusion and facet degenerative changes resulting in narrowing of the right subarticular zone and causing impingement on the traversing right S1 nerve root.  IMPRESSION: 1. Right subarticular disc protrusion at L5-S1 resulting in narrowing of the right subarticular zone and impinging on the traversing right S1 nerve root. 2. Moderate bilateral neural foraminal narrowing at L4-L5 and on the right at L1-L2 and L3-L4. 3. No spinal canal stenosis at any level.   Electronically Signed   By: Pedro Earls M.D.   On: 04/02/2020 19:48     Objective:  VS:  HT:    WT:   BMI:     BP:105/73  HR:82bpm  TEMP: ( )  RESP:  Physical Exam Vitals and nursing note reviewed.  Constitutional:      General: She is not in acute distress.    Appearance: Normal appearance. She is obese. She is not ill-appearing.  HENT:     Head: Normocephalic and atraumatic.     Right Ear: External ear normal.     Left Ear: External ear normal.  Eyes:      Extraocular Movements: Extraocular movements intact.  Cardiovascular:     Rate and Rhythm: Normal rate.     Pulses: Normal pulses.  Pulmonary:     Effort: Pulmonary effort is normal. No respiratory distress.  Abdominal:     General: There is no distension.     Palpations: Abdomen is soft.  Musculoskeletal:        General: Tenderness present.     Cervical back: Neck supple.     Right lower leg: No edema.     Left lower leg: No edema.     Comments: Patient has good distal strength with no pain over the greater trochanters.  No clonus or focal weakness.  Skin:    Findings: No erythema, lesion or rash.  Neurological:     General: No focal deficit present.     Mental Status: She is alert and oriented to person, place, and time.     Sensory: No sensory deficit.     Motor: No weakness or abnormal muscle tone.     Coordination: Coordination normal.  Psychiatric:        Mood and Affect: Mood normal.        Behavior: Behavior normal.      Imaging: No results found.

## 2020-06-29 ENCOUNTER — Ambulatory Visit: Payer: Medicaid Other | Admitting: Orthopaedic Surgery

## 2020-07-02 ENCOUNTER — Ambulatory Visit (INDEPENDENT_AMBULATORY_CARE_PROVIDER_SITE_OTHER): Payer: Medicaid Other | Admitting: Physician Assistant

## 2020-07-02 ENCOUNTER — Encounter: Payer: Self-pay | Admitting: Physician Assistant

## 2020-07-02 DIAGNOSIS — M545 Low back pain, unspecified: Secondary | ICD-10-CM | POA: Diagnosis not present
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
## 2020-07-02 NOTE — Addendum Note (Signed)
Addended by: Robyne Peers on: 07/02/2020 03:30 PM   Modules accepted: Orders

## 2020-07-03 ENCOUNTER — Telehealth: Payer: Self-pay

## 2020-07-03 ENCOUNTER — Other Ambulatory Visit: Payer: Self-pay | Admitting: Orthopaedic Surgery

## 2020-07-03 ENCOUNTER — Telehealth: Payer: Self-pay | Admitting: Physician Assistant

## 2020-07-03 ENCOUNTER — Telehealth: Payer: Self-pay | Admitting: Physical Medicine and Rehabilitation

## 2020-07-03 MED ORDER — TRAMADOL HCL 50 MG PO TABS
50.0000 mg | ORAL_TABLET | Freq: Four times a day (QID) | ORAL | 0 refills | Status: DC | PRN
Start: 2020-07-03 — End: 2020-07-13

## 2020-07-03 MED ORDER — METHOCARBAMOL 500 MG PO TABS: 500.0000 mg | ORAL_TABLET | Freq: Four times a day (QID) | ORAL | 1 refills | Status: DC | PRN

## 2020-07-03 NOTE — Telephone Encounter (Signed)
I called patient and advised medications have been sent to the pharmacy.   Sending back to you as patient would like explanation as to why she was not called back this morning in regards to scheduling appointment with Dr. Ernestina Patches.

## 2020-07-03 NOTE — Telephone Encounter (Signed)
Patient called. She would like a call to schedule appointment with Dr. Ernestina Patches.

## 2020-07-03 NOTE — Telephone Encounter (Signed)
I sent in some Tramadol and some Robaxin to try.

## 2020-07-03 NOTE — Telephone Encounter (Signed)
See below . Dr Ernestina Patches out of the office and patient not yet scheduled to see him. Wanting to know if something could be sent in for her to help with pain? Please advise thanks.

## 2020-07-03 NOTE — Telephone Encounter (Signed)
Patient called regarding last message call back [phone]

## 2020-07-03 NOTE — Telephone Encounter (Signed)
Patient called advised she left 4 messages for a call back to schedule an appointment with Dr Ernestina Patches because she is in so much pain. Patient said she is taking Tylenol and it is making her sick on her stomach. The number to contact patient is [phone]

## 2020-07-06 ENCOUNTER — Telehealth: Payer: Self-pay

## 2020-07-06 NOTE — Telephone Encounter (Signed)
She saw Artis Delay, he talked to me, there is referral for for facet/MBB, not sure status on that appointment

## 2020-07-06 NOTE — Telephone Encounter (Signed)
See notes below. Was there a voicemail from patient on Friday?

## 2020-07-06 NOTE — Telephone Encounter (Signed)
See previous message

## 2020-07-06 NOTE — Telephone Encounter (Signed)
Patient reports that she had good relief for about 2 weeks following her right L5-S1 IL on 1/10, but she "needs something that is going to last more than 2 weeks." Please advise.

## 2020-07-06 NOTE — Telephone Encounter (Signed)
Called pt and inform her that her insurance is PENDING.

## 2020-07-06 NOTE — Telephone Encounter (Signed)
Patient called and requested to speak with a supervisor.  She was transferred to me and she voiced to me that she was very upset about several things: 1-She was hurting and no one seemed to want to help her. (I did remind her that I spoke with her on Friday and was able to reach out to Dr Ninfa Linden and he sent in medication for her 2-Dr Ernestina Patches was not able to see her until 07/21/20 for her injection.  (I did advise her that this was normal for him to be scheduled out at least a week or 2 most of the time but that his team had a cancellation list that was kept and if she wanted to be added to it we could certainly get her on the list.  Also did offer for [place] to see what [facility]'s first available is for [facility] and we are working on that for her also) 3-After she was scheduled, she was advised by [employer] that the approval for the injection was pending authorization by her [employer].  Patient stated that she called her insurance and was advised by them that they had not received any information regarding auth from our office.  I did follow back up with [facility], and was able to confirm that [employer] which is the website used was down when she tried to initiate PA so she did call [employer] and was advised to download a PA form, complete and fax it back.  [employer] does have record of the PA form and confirmation that the fax went through when she submitted it this morning.   I will reach out to patient this afternoon or first thing in the morning to let her know what I have found out for her.

## 2020-07-07 NOTE — Telephone Encounter (Signed)
Tried calling patient to discuss. No answer. Unable to LM, no VM

## 2020-07-07 NOTE — Telephone Encounter (Signed)
Called pt and sch 2/15 

## 2020-07-07 NOTE — Telephone Encounter (Signed)
Called pt and couldn't leave message due to the call couldn't be complete.

## 2020-07-08 NOTE — Telephone Encounter (Signed)
Tried calling patient to discuss. No answer. Unable to LM, no VM 

## 2020-07-13 ENCOUNTER — Other Ambulatory Visit: Payer: Self-pay | Admitting: Orthopaedic Surgery

## 2020-07-13 ENCOUNTER — Telehealth: Payer: Self-pay | Admitting: Physical Medicine and Rehabilitation

## 2020-07-13 NOTE — Telephone Encounter (Signed)
Please advise 

## 2020-07-13 NOTE — Telephone Encounter (Signed)
Pt called stating she needs a refill of her Robaxin and Tramadol, she states her pharmacy put in a request and she just wanted to make [facility] aware.  [phone]

## 2020-07-13 NOTE — Telephone Encounter (Signed)
Looks like this was refilled by Dr. Ninfa Linden.

## 2020-07-21 ENCOUNTER — Ambulatory Visit (INDEPENDENT_AMBULATORY_CARE_PROVIDER_SITE_OTHER): Payer: Medicaid Other | Admitting: Physical Medicine and Rehabilitation

## 2020-07-21 ENCOUNTER — Encounter: Payer: Self-pay | Admitting: Physical Medicine and Rehabilitation

## 2020-07-21 ENCOUNTER — Ambulatory Visit: Payer: Self-pay

## 2020-07-21 VITALS — BP 116/78 | HR 66

## 2020-07-21 DIAGNOSIS — M5116 Intervertebral disc disorders with radiculopathy, lumbar region: Secondary | ICD-10-CM | POA: Diagnosis not present

## 2020-07-21 DIAGNOSIS — M47816 Spondylosis without myelopathy or radiculopathy, lumbar region: Secondary | ICD-10-CM | POA: Diagnosis not present

## 2020-07-21 DIAGNOSIS — M5416 Radiculopathy, lumbar region: Secondary | ICD-10-CM

## 2020-07-21 MED ORDER — BUPIVACAINE HCL 0.5 % IJ SOLN: 3.0000 mL | Freq: Once | INTRAMUSCULAR | Status: DC

## 2020-07-21 NOTE — Patient Instructions (Signed)

## 2020-07-21 NOTE — Progress Notes (Signed)
Rebekah Howard - 60 y.o. female MRN [id number]  Date of birth: [birth year]  Office Visit Note: Visit Date: 07/21/2020 PCP: Jeralyn Bennett, MD Referred by: Jeralyn Bennett, MD  Subjective: Chief Complaint  Patient presents with  . Lower Back - Pain   HPI:  Rebekah Howard is a 60 y.o. female who comes in today For evaluation and management at the request of Benita Stabile, PA-C for chronic worsening severe right buttock and hip pain.  Rebekah Howard had come to me after the last injection which was the patient's first injection with the fact that she did not get good complete relief for a long time and we talked over possibly doing facet joint blocks diagnostically.  Today she clearly has right-sided low back and buttock pain that refers into the posterior lateral hip.  She reports that originally she had more sciatica type symptoms but that is really not going past the knee at this point.  No left-sided complaints.  Worse with sitting and movement.  She reports constant 8 out of 10 pain very sharp stabbing pain.  I did review her MRI with her today showing her the images and spine models.  She reports that she had not seen the MRI before we did show her the disc herniation at L5-S1 abutting the S1 nerve root which is clearly evident on the pictures.  She does have pretty severe arthritis at L4-5.  She has some feeling of paresthesia at times.  She has no focal weakness no bowel or bladder changes she is failed all manner of conservative and nonconservative care.  Case is complicated by increased BMI.  Review of Systems  Musculoskeletal: Positive for back pain and joint pain.  All other systems reviewed and are negative.  Otherwise per HPI.  Assessment & Plan: Visit Diagnoses:    ICD-10-CM   1. Lumbar radiculopathy  M54.16 Epidural Steroid injection  2. Radiculopathy due to lumbar intervertebral disc disorder  M51.16 Epidural Steroid injection  3. Spondylosis without myelopathy or radiculopathy,  lumbar region  M47.816 XR C-ARM NO REPORT    Epidural Steroid injection    DISCONTINUED: bupivacaine (MARCAINE) 0.5 % (with pres) injection 3 mL    CANCELED: Facet Injection    Plan: Findings:  Chronic worsening severe right hip and leg pain consistent with S1 radiculopathy from L5-S1 disc herniation protrusion.  Prior epidural injection which was L5-S1 interlaminar injection gave her really good relief for about a week or so.  She reports really consistent pain relief to the point where if it had stayed that way she would have been very happy.  I think given the diagnostic process of that epidural injection that the source of pain really is the disc herniation and not her facet joints.  Those clearly could be given some contribution to her pain.  At this point I will repeat the epidural injection and just see how much more relief therapeutically she can get.  She may be someone who needs to see a spine surgeon for microdiscectomy.  Again case would be complicated Duda body habitus and BMI.    Meds & Orders:  Meds ordered this encounter  Medications  . DISCONTD: bupivacaine (MARCAINE) 0.5 % (with pres) injection 3 mL    Orders Placed This Encounter  Procedures  . XR C-ARM NO REPORT  . Epidural Steroid injection    Follow-up: Return if symptoms worsen or fail to improve.   Procedures: No procedures performed  Lumbar Epidural Steroid Injection - Interlaminar Approach with Fluoroscopic  Guidance  Patient: Rebekah Howard      Date of Birth: Dec 23, 1960 MRN: [id number] PCP: Jeralyn Bennett, MD      Visit Date: 07/21/2020   Universal Protocol:     Consent Given By: the patient  Position: PRONE  Additional Comments: Vital signs were monitored before and after the procedure. Patient was prepped and draped in the usual sterile fashion. The correct patient, procedure, and site was verified.   Injection Procedure Details:   Procedure diagnoses: Lumbar radiculopathy [M54.16]    Meds Administered:  Meds ordered this encounter  Medications  . bupivacaine (MARCAINE) 0.5 % (with pres) injection 3 mL     Laterality: Right  Location/Site:  L5-S1  Needle: 6.0 in., 20 ga. Tuohy  Needle Placement: Paramedian epidural  Findings:   -Comments: Excellent flow of contrast into the epidural space.  Procedure Details: Using a paramedian approach from the side mentioned above, the region overlying the inferior lamina was localized under fluoroscopic visualization and the soft tissues overlying this structure were infiltrated with 4 ml. of 1% Lidocaine without Epinephrine. The Tuohy needle was inserted into the epidural space using a paramedian approach.   The epidural space was localized using loss of resistance along with counter oblique bi-planar fluoroscopic views.  After negative aspirate for air, blood, and CSF, a 2 ml. volume of Isovue-250 was injected into the epidural space and the flow of contrast was observed. Radiographs were obtained for documentation purposes.    The injectate was administered into the level noted above.   Additional Comments:  The patient tolerated the procedure well Dressing: 2 x 2 sterile gauze and Band-Aid    Post-procedure details: Patient was observed during the procedure. Post-procedure instructions were reviewed.  Patient left the clinic in stable condition.     Clinical History: MRI LUMBAR SPINE WITHOUT CONTRAST  TECHNIQUE: Multiplanar, multisequence MR imaging of the lumbar spine was performed. No intravenous contrast was administered.  COMPARISON:  Plain films December 20, 2019.  FINDINGS: Segmentation:  Standard.  Alignment:  Mild anterolisthesis of L4 over L5, degenerative.  Vertebrae:  No fracture, evidence of discitis, or bone lesion.  Conus medullaris and cauda equina: Conus extends to the L1 level. Conus and cauda equina appear normal.  Paraspinal and other soft tissues: Negative.  Disc  levels:  T12-L1: No spinal canal or neural foraminal stenosis.  L1-2: Shallow disc bulge with superimposed right foraminal disc protrusion and facet degenerative changes resulting in moderate right neural foraminal narrowing. No spinal canal stenosis.  L2-3: Shallow disc bulge and prominent facet degenerative changes resulting in mild right neural foraminal narrowing. No spinal canal stenosis.  L3-4: Prominent facet degenerative changes resulting in moderate left neural foraminal narrowing. No spinal canal stenosis.  L4-5: Disc uncovering, prominent hypertrophic facet degenerative changes and ligamentum flavum redundancy resulting in narrowing of the bilateral subarticular zones, right greater than left, and moderate bilateral neural foraminal narrowing.  L5-S1: Right subarticular disc protrusion and facet degenerative changes resulting in narrowing of the right subarticular zone and causing impingement on the traversing right S1 nerve root.  IMPRESSION: 1. Right subarticular disc protrusion at L5-S1 resulting in narrowing of the right subarticular zone and impinging on the traversing right S1 nerve root. 2. Moderate bilateral neural foraminal narrowing at L4-L5 and on the right at L1-L2 and L3-L4. 3. No spinal canal stenosis at any level.   Electronically Signed   By: Pedro Earls M.D.   On: 04/02/2020 19:48     Objective:  VS:  HT:    WT:   BMI:     BP:116/78  HR:66bpm  TEMP: ( )  RESP:  Physical Exam Vitals and nursing note reviewed.  Constitutional:      General: She is not in acute distress.    Appearance: Normal appearance. She is obese. She is not ill-appearing.  HENT:     Head: Normocephalic and atraumatic.     Right Ear: External ear normal.     Left Ear: External ear normal.  Eyes:     Extraocular Movements: Extraocular movements intact.  Cardiovascular:     Rate and Rhythm: Normal rate.     Pulses: Normal pulses.   Pulmonary:     Effort: Pulmonary effort is normal. No respiratory distress.  Abdominal:     General: There is no distension.     Palpations: Abdomen is soft.  Musculoskeletal:        General: Tenderness present.     Cervical back: Neck supple.     Right lower leg: No edema.     Left lower leg: No edema.     Comments: Patient has good distal strength with no pain over the greater trochanters.  No clonus or focal weakness.  She has no pain with hip rotation.  She has positive slump test on the right.  Skin:    Findings: No erythema, lesion or rash.  Neurological:     General: No focal deficit present.     Mental Status: She is alert and oriented to person, place, and time.     Sensory: No sensory deficit.     Motor: No weakness or abnormal muscle tone.     Coordination: Coordination normal.  Psychiatric:        Mood and Affect: Mood normal.        Behavior: Behavior normal.      Imaging: XR C-ARM NO REPORT  Result Date: 07/21/2020 Please see Notes tab for imaging impression.

## 2020-07-21 NOTE — Progress Notes (Signed)
Numeric Pain Rating Scale and Functional Assessment Average Pain 8   In the last MONTH (on 0-10 scale) has pain interfered with the following?  1. General activity like being  able to carry out your everyday physical activities such as walking, climbing stairs, carrying groceries, or moving a chair?  Rating(10)   +Driver, -BT, -Dye Allergies.  Pain is in the right lower back and is having some pain in the right upper thigh.

## 2020-07-21 NOTE — Procedures (Signed)
Lumbar Epidural Steroid Injection - Interlaminar Approach with Fluoroscopic Guidance  Patient: Rebekah Howard      Date of Birth: May 21, 1961 MRN: [id number] PCP: Jeralyn Bennett, MD      Visit Date: 07/21/2020   Universal Protocol:     Consent Given By: the patient  Position: PRONE  Additional Comments: Vital signs were monitored before and after the procedure. Patient was prepped and draped in the usual sterile fashion. The correct patient, procedure, and site was verified.   Injection Procedure Details:   Procedure diagnoses: Lumbar radiculopathy [M54.16]   Meds Administered:  Meds ordered this encounter  Medications  . bupivacaine (MARCAINE) 0.5 % (with pres) injection 3 mL     Laterality: Right  Location/Site:  L5-S1  Needle: 6.0 in., 20 ga. Tuohy  Needle Placement: Paramedian epidural  Findings:   -Comments: Excellent flow of contrast into the epidural space.  Procedure Details: Using a paramedian approach from the side mentioned above, the region overlying the inferior lamina was localized under fluoroscopic visualization and the soft tissues overlying this structure were infiltrated with 4 ml. of 1% Lidocaine without Epinephrine. The Tuohy needle was inserted into the epidural space using a paramedian approach.   The epidural space was localized using loss of resistance along with counter oblique bi-planar fluoroscopic views.  After negative aspirate for air, blood, and CSF, a 2 ml. volume of Isovue-250 was injected into the epidural space and the flow of contrast was observed. Radiographs were obtained for documentation purposes.    The injectate was administered into the level noted above.   Additional Comments:  The patient tolerated the procedure well Dressing: 2 x 2 sterile gauze and Band-Aid    Post-procedure details: Patient was observed during the procedure. Post-procedure instructions were reviewed.  Patient left the clinic in stable  condition.

## 2020-07-28 ENCOUNTER — Telehealth: Payer: Self-pay | Admitting: Physical Medicine and Rehabilitation

## 2020-07-28 NOTE — Telephone Encounter (Signed)
Patient call advised she do not feel any pain right now. Patient said she need Rx refilled Tramadol. Patient asked what should she do if the pain comes back? The number to contact patient is [phone]

## 2020-07-28 NOTE — Telephone Encounter (Signed)
If pain returns then f/up Dr. Ninfa Linden or [place]

## 2020-07-28 NOTE — Telephone Encounter (Signed)
Called patient to advise and she verbalized understanding.

## 2020-07-28 NOTE — Telephone Encounter (Signed)
Please advise. Tramadol last prescribed by Dr. Ninfa Linden on 2/7.

## 2020-08-10 ENCOUNTER — Other Ambulatory Visit: Payer: Self-pay | Admitting: Internal Medicine

## 2020-08-10 DIAGNOSIS — R7303 Prediabetes: Secondary | ICD-10-CM

## 2020-08-11 ENCOUNTER — Telehealth: Payer: Self-pay

## 2020-08-11 DIAGNOSIS — R7303 Prediabetes: Secondary | ICD-10-CM

## 2020-08-11 MED ORDER — METFORMIN HCL ER 500 MG PO TB24: 500.0000 mg | ORAL_TABLET | Freq: Every day | ORAL | 1 refills | Status: DC

## 2020-08-11 NOTE — Telephone Encounter (Signed)
Pls contact pt pharmacy [phone]

## 2020-08-11 NOTE — Telephone Encounter (Signed)
Sent. Forwarded to [place] as well to get him enrolled.

## 2020-08-11 NOTE — Telephone Encounter (Signed)
I called [facility] - stated received Metformin rx from Dr Alfonse Spruce but he's not enroll with Medicaid. I will ask The Attending to re-send rx. Thanks

## 2020-08-12 ENCOUNTER — Telehealth: Payer: Self-pay

## 2020-08-12 NOTE — Telephone Encounter (Signed)
atorvastatin and lisinopril-hctz waiting for pick up-pharmacy states metformin refill on file and will add to pick up-pt aware and will call pharmacy back to have meds delivered.Regenia Skeeter, Sahithi Ordoyne Cassady3/9/202210:10 AM

## 2020-08-12 NOTE — Telephone Encounter (Signed)
Pt is requesting a call back about her medication  ( she is completely out of her medication )

## 2020-09-01 ENCOUNTER — Other Ambulatory Visit: Payer: Self-pay

## 2020-09-01 DIAGNOSIS — E785 Hyperlipidemia, unspecified: Secondary | ICD-10-CM

## 2020-09-01 MED ORDER — ATORVASTATIN CALCIUM 40 MG PO TABS: 40.0000 mg | ORAL_TABLET | Freq: Every day | ORAL | 3 refills | Status: DC

## 2020-09-07 ENCOUNTER — Other Ambulatory Visit: Payer: Self-pay

## 2020-09-07 ENCOUNTER — Encounter: Payer: Self-pay | Admitting: Student

## 2020-09-07 ENCOUNTER — Ambulatory Visit: Payer: Medicaid Other | Admitting: Student

## 2020-09-07 DIAGNOSIS — R7303 Prediabetes: Secondary | ICD-10-CM | POA: Diagnosis not present

## 2020-09-07 DIAGNOSIS — M5441 Lumbago with sciatica, right side: Secondary | ICD-10-CM | POA: Diagnosis not present

## 2020-09-07 DIAGNOSIS — E782 Mixed hyperlipidemia: Secondary | ICD-10-CM

## 2020-09-07 DIAGNOSIS — Z23 Encounter for immunization: Secondary | ICD-10-CM

## 2020-09-07 DIAGNOSIS — Z0000 Encounter for general adult medical examination without abnormal findings: Secondary | ICD-10-CM | POA: Diagnosis not present

## 2020-09-07 DIAGNOSIS — G8929 Other chronic pain: Secondary | ICD-10-CM

## 2020-09-07 DIAGNOSIS — F418 Other specified anxiety disorders: Secondary | ICD-10-CM | POA: Diagnosis not present

## 2020-09-07 DIAGNOSIS — M5442 Lumbago with sciatica, left side: Secondary | ICD-10-CM | POA: Diagnosis not present

## 2020-09-07 DIAGNOSIS — I10 Essential (primary) hypertension: Secondary | ICD-10-CM

## 2020-09-07 LAB — POCT GLYCOSYLATED HEMOGLOBIN (HGB A1C): Hemoglobin A1C: 5.4 % (ref 4.0–5.6)

## 2020-09-07 LAB — GLUCOSE, CAPILLARY: Glucose-Capillary: 85 mg/dL (ref 70–99)

## 2020-09-07 MED ORDER — ZOSTER VAC RECOMB ADJUVANTED 50 MCG/0.5ML IM SUSR: 0.5000 mL | Freq: Once | INTRAMUSCULAR | 0 refills | Status: DC

## 2020-09-07 NOTE — Assessment & Plan Note (Signed)
Blood pressure remains very well controlled.  BP Readings from Last 3 Encounters:  09/07/20 102/66  07/21/20 116/78  06/15/20 105/73   - Continue Zestoretic 20-25mg  daily

## 2020-09-07 NOTE — Progress Notes (Signed)
[facility] Attending  Case discussed with Dr. Speakman  At the time of the visit.  We reviewed the resident's history and exam and pertinent patient test results.  I agree with the assessment, diagnosis, and plan of care documented in the resident's note.  

## 2020-09-07 NOTE — Patient Instructions (Signed)
Rebekah Howard,   Your weight has remained relatively stable over the past couple of years. However, your health conditions such as high cholesterol, high blood pressure, and pre-diabetes could all be improved most by just a bit of weight loss.   Today, I have referred you to medical weight management. You will receive a call to schedule an appointment. Unfortunately it can take a couple of months to get an appointment, but with a bit of patience and dedication towards avoiding simple sugars in the meantime, I am hopeful we can get a good grasp of your weight troubles.   We will check your cholesterol and A1c. I will call you with results when they are available.   I have also provided a script for you to get the Shingles vaccine at your earliest convenience should you decide to do so. This will need to be done at a local pharmacy and you will need to get a second shot 2-6 months later.   You will not be due for a colonoscopy or PAP smear until 2023 and are due for your mammogram 01/2021.   Please call us at [phone] if you have any concerns in the meantime; otherwise, you may schedule a follow up visit in 6 months.   Thank you and take care!  Dr. Konrad Penta    Calorie Counting for Weight Loss Calories are units of energy. Your body needs a certain number of calories from food to keep going throughout the day. When you eat or drink more calories than your body needs, your body stores the extra calories mostly as fat. When you eat or drink fewer calories than your body needs, your body burns fat to get the energy it needs. Calorie counting means keeping track of how many calories you eat and drink each day. Calorie counting can be helpful if you need to lose weight. If you eat fewer calories than your body needs, you should lose weight. Ask your health care provider what a healthy weight is for you. For calorie counting to work, you will need to eat the right number of calories each day to lose a  healthy amount of weight per week. A dietitian can help you figure out how many calories you need in a day and will suggest ways to reach your calorie goal.  A healthy amount of weight to lose each week is usually 1-2 lb (0.5-0.9 kg). This usually means that your daily calorie intake should be reduced by 500-750 calories.  Eating 1,200-1,500 calories a day can help most women lose weight.  Eating 1,500-1,800 calories a day can help most men lose weight. What do I need to know about calorie counting? Work with your health care provider or dietitian to determine how many calories you should get each day. To meet your daily calorie goal, you will need to:  Find out how many calories are in each food that you would like to eat. Try to do this before you eat.  Decide how much of the food you plan to eat.  Keep a food log. Do this by writing down what you ate and how many calories it had. To successfully lose weight, it is important to balance calorie counting with a healthy lifestyle that includes regular activity. Where do I find calorie information? The number of calories in a food can be found on a Nutrition Facts label. If a food does not have a Nutrition Facts label, try to look up the calories online or ask  your dietitian for help. Remember that calories are listed per serving. If you choose to have more than one serving of a food, you will have to multiply the calories per serving by the number of servings you plan to eat. For example, the label on a package of bread might say that a serving size is 1 slice and that there are 90 calories in a serving. If you eat 1 slice, you will have eaten 90 calories. If you eat 2 slices, you will have eaten 180 calories.   How do I keep a food log? After each time that you eat, record the following in your food log as soon as possible:  What you ate. Be sure to include toppings, sauces, and other extras on the food.  How much you ate. This can be  measured in cups, ounces, or number of items.  How many calories were in each food and drink.  The total number of calories in the food you ate. Keep your food log near you, such as in a pocket-sized notebook or on an app or website on your mobile phone. Some programs will calculate calories for you and show you how many calories you have left to meet your daily goal. What are some portion-control tips?  Know how many calories are in a serving. This will help you know how many servings you can have of a certain food.  Use a measuring cup to measure serving sizes. You could also try weighing out portions on a kitchen scale. With time, you will be able to estimate serving sizes for some foods.  Take time to put servings of different foods on your favorite plates or in your favorite bowls and cups so you know what a serving looks like.  Try not to eat straight from a food's packaging, such as from a bag or box. Eating straight from the package makes it hard to see how much you are eating and can lead to overeating. Put the amount you would like to eat in a cup or on a plate to make sure you are eating the right portion.  Use smaller plates, glasses, and bowls for smaller portions and to prevent overeating.  Try not to multitask. For example, avoid watching TV or using your computer while eating. If it is time to eat, sit down at a table and enjoy your food. This will help you recognize when you are full. It will also help you be more mindful of what and how much you are eating. What are tips for following this plan? Reading food labels  Check the calorie count compared with the serving size. The serving size may be smaller than what you are used to eating.  Check the source of the calories. Try to choose foods that are high in protein, fiber, and vitamins, and low in saturated fat, trans fat, and sodium. Shopping  Read nutrition labels while you shop. This will help you make healthy decisions  about which foods to buy.  Pay attention to nutrition labels for low-fat or fat-free foods. These foods sometimes have the same number of calories or more calories than the full-fat versions. They also often have added sugar, starch, or salt to make up for flavor that was removed with the fat.  Make a grocery list of lower-calorie foods and stick to it. Cooking  Try to cook your favorite foods in a healthier way. For example, try baking instead of frying.  Use low-fat dairy products. Meal  planning  Use more fruits and vegetables. One-half of your plate should be fruits and vegetables.  Include lean proteins, such as chicken, [place], and fish. Lifestyle Each week, aim to do one of the following:  150 minutes of moderate exercise, such as walking.  75 minutes of vigorous exercise, such as running. General information  Know how many calories are in the foods you eat most often. This will help you calculate calorie counts faster.  Find a way of tracking calories that works for you. Get creative. Try different apps or programs if writing down calories does not work for you. What foods should I eat?  Eat nutritious foods. It is better to have a nutritious, high-calorie food, such as an avocado, than a food with few nutrients, such as a bag of potato chips.  Use your calories on foods and drinks that will fill you up and will not leave you hungry soon after eating. ? Examples of foods that fill you up are nuts and nut butters, vegetables, lean proteins, and high-fiber foods such as whole grains. High-fiber foods are foods with more than 5 g of fiber per serving.  Pay attention to calories in drinks. Low-calorie drinks include water and unsweetened drinks. The items listed above may not be a complete list of foods and beverages you can eat. Contact a dietitian for more information.   What foods should I limit? Limit foods or drinks that are not good sources of vitamins, minerals, or  protein or that are high in unhealthy fats. These include:  Candy.  Other sweets.  Sodas, specialty coffee drinks, alcohol, and juice. The items listed above may not be a complete list of foods and beverages you should avoid. Contact a dietitian for more information. How do I count calories when eating out?  Pay attention to portions. Often, portions are much larger when eating out. Try these tips to keep portions smaller: ? Consider sharing a meal instead of getting your own. ? If you get your own meal, eat only half of it. Before you start eating, ask for a container and put half of your meal into it. ? When available, consider ordering smaller portions from the menu instead of full portions.  Pay attention to your food and drink choices. Knowing the way food is cooked and what is included with the meal can help you eat fewer calories. ? If calories are listed on the menu, choose the lower-calorie options. ? Choose dishes that include vegetables, fruits, whole grains, low-fat dairy products, and lean proteins. ? Choose items that are boiled, broiled, grilled, or steamed. Avoid items that are buttered, battered, fried, or served with cream sauce. Items labeled as crispy are usually fried, unless stated otherwise. ? Choose water, low-fat milk, unsweetened iced tea, or other drinks without added sugar. If you want an alcoholic beverage, choose a lower-calorie option, such as a glass of wine or light beer. ? Ask for dressings, sauces, and syrups on the side. These are usually high in calories, so you should limit the amount you eat. ? If you want a salad, choose a garden salad and ask for grilled meats. Avoid extra toppings such as bacon, cheese, or fried items. Ask for the dressing on the side, or ask for olive oil and vinegar or lemon to use as dressing.  Estimate how many servings of a food you are given. Knowing serving sizes will help you be aware of how much food you are eating at  restaurants. Where  to find more information  Centers for Disease Control and Prevention: http://www.wolf.info/  [place] Department of Agriculture: http://www.wilson-mendoza.org/ Summary  Calorie counting means keeping track of how many calories you eat and drink each day. If you eat fewer calories than your body needs, you should lose weight.  A healthy amount of weight to lose per week is usually 1-2 lb (0.5-0.9 kg). This usually means reducing your daily calorie intake by 500-750 calories.  The number of calories in a food can be found on a Nutrition Facts label. If a food does not have a Nutrition Facts label, try to look up the calories online or ask your dietitian for help.  Use smaller plates, glasses, and bowls for smaller portions and to prevent overeating.  Use your calories on foods and drinks that will fill you up and not leave you hungry shortly after a meal. This information is not intended to replace advice given to you by your health care provider. Make sure you discuss any questions you have with your health care provider. Document Revised: 07/04/2019 Document Reviewed: 07/04/2019 Elsevier Patient Education  2021 [employer].

## 2020-09-07 NOTE — Assessment & Plan Note (Signed)
Patient has a history of hyperlipidemia. Her last lipid profile was 12/2019 with LDL of 85 within goal for primary prevention; however, patient requests I check her lipids again today.   - Will repeat cholesterol panel today  - Continue atorvastatin 40mg  daily

## 2020-09-07 NOTE — Assessment & Plan Note (Signed)
Patient was noted in 2019 to have pre-diabetes. Hgb A1c has been stable at 5.6 more recently; however, patient requests to have her A1c rechecked this visit.   - Will check Hgb A1c  - For now, continue metformin 500mg  daily

## 2020-09-07 NOTE — Assessment & Plan Note (Signed)
Patient states her mood is currently "good" and stable. Denies SI.   - Continue close monitoring.

## 2020-09-07 NOTE — Assessment & Plan Note (Signed)
MRI 04/02/20 showed right disc protrusion at L5-S1 with impingement of the transverse right S1 nerve root. Moderate bilateral neural foraminal narrowing at L4-L5 and right L1-L4. She has right hip bursitis with mild right gluteal medius tendinosis and degenerative changes. She has been following with orthopedics and received a steroid injection 06/15/20 with significant improvement. Per ortho notes, they are considering ablation with PT on [address]   - Continue following with orthopedics

## 2020-09-07 NOTE — Assessment & Plan Note (Signed)
Patient is interested in shingles vaccine today.   - Prescription written for Shingrex vaccine at outside pharmacy  - She is not interested in COVID-19 booster shot at this time; concerned regarding significant (non-allergic) side effects - Due for colonoscopy and Pap smear in 2023 - Due for mammography 01/2021

## 2020-09-07 NOTE — Assessment & Plan Note (Signed)
Patient is concerned that she has not lost weight. She is able to move about much more easily after her steroid injection, although notes she continues to struggle with eating sugary foods such as cakes and cookies. We discussed the importance of portion control and discussed reserving the above as special treats. Discussed that even a bit of weight loss can have drastic impacts on her other medical conditions and overall well-being. She has never formally followed with a weight loss center and experienced troubles with prior authorization of Victoza for weight loss.   - Will refer to weight management clinic  - Continue to encourage healthy weight loss
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
## 2020-09-08 LAB — LIPID PANEL
Chol/HDL Ratio: 2.9 ratio (ref 0.0–4.4)
Cholesterol, Total: 172 mg/dL (ref 100–199)
HDL: 60 mg/dL (ref >39)
LDL Chol Calc (NIH): 96 mg/dL (ref 0–99)
Triglycerides: 89 mg/dL (ref 0–149)
VLDL Cholesterol Cal: 16 mg/dL (ref 5–40)

## 2020-09-11 ENCOUNTER — Other Ambulatory Visit: Payer: Self-pay | Admitting: Student

## 2020-09-11 ENCOUNTER — Telehealth: Payer: Self-pay | Admitting: Student

## 2020-09-11 NOTE — Telephone Encounter (Signed)
Spoke with patient regarding her lab results. She was very excited to hear that her hemoglobin A1c continues to decrease, down to 5.4, and has been within normal range on multiple occasions recently. Her LDL was 96, within range for primary prevention on atorvastatin 40mg  daily. She notes she has been struggling for 1 week with frequent loose stools and intermittent hot flashes although denies dark or bloody stools, nausea, vomiting, abdominal pain, or decrease in PO intake. Her symptoms are consistent with possible viral illness. She wonders whether she continues to need her blood pressure medication.   Plan:  - Encouraged she follow up in 1 week if symptoms above don't resolve  - Otherwise, okay to follow up in 3 months  - Will discontinue Metformin today  - Discussed possibility of decreasing her antihypertensive at future visit if pressures remain under great control  - Check A1c in 3 months off metformin and continue yearly cholesterol monitoring  - Encouraged continued portion control and avoidance of simple sugars.   Jeralyn Bennett, MD 09/11/2020, 11:50 AM Pager: [phone]

## 2020-09-21 ENCOUNTER — Encounter: Payer: Self-pay | Admitting: *Deleted

## 2020-10-20 NOTE — Addendum Note (Signed)
Addended by: Hulan Fray on: 10/20/2020 09:40 AM   Modules accepted: Orders

## 2020-10-21 ENCOUNTER — Other Ambulatory Visit: Payer: Self-pay

## 2020-10-21 ENCOUNTER — Ambulatory Visit
Admission: EM | Admit: 2020-10-21 | Discharge: 2020-10-21 | Disposition: A | Discharge status: Home / Self Care | Payer: Medicaid Other | Attending: Emergency Medicine | Admitting: Emergency Medicine

## 2020-10-21 DIAGNOSIS — Z20822 Contact with and (suspected) exposure to covid-19: Secondary | ICD-10-CM | POA: Diagnosis not present

## 2020-10-21 DIAGNOSIS — J069 Acute upper respiratory infection, unspecified: Secondary | ICD-10-CM | POA: Diagnosis not present

## 2020-10-21 MED ORDER — DM-GUAIFENESIN ER 30-600 MG PO TB12: 1.0000 | ORAL_TABLET | Freq: Two times a day (BID) | ORAL | 0 refills | Status: DC

## 2020-10-21 MED ORDER — CETIRIZINE HCL 10 MG PO CAPS: 10.0000 mg | ORAL_CAPSULE | Freq: Every day | ORAL | 0 refills | Status: DC

## 2020-10-21 MED ORDER — BENZONATATE 200 MG PO CAPS: 200.0000 mg | ORAL_CAPSULE | Freq: Three times a day (TID) | ORAL | 0 refills | Status: AC | PRN

## 2020-10-21 NOTE — ED Triage Notes (Signed)
Pt c/o cough, sore throat, nasal congestion, and feeling hot/cold since yesterday. States had a neg home covid test today but would like another.

## 2020-10-21 NOTE — Discharge Instructions (Signed)
COVID test pending, monitor MyChart for results Rest and fluids Tylenol and ibuprofen as needed for any body aches, fevers, sore throat Tessalon every 8 hours for cough Mucinex DM twice daily for further relief of cough/congestion Daily cetirizine/Zyrtec for throat irritation/drainage Follow-up if not improving or worsening
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
## 2020-10-22 LAB — COVID-19, FLU A+B NAA
Influenza A, NAA: NOT DETECTED
Influenza B, NAA: NOT DETECTED
SARS-CoV-2, NAA: NOT DETECTED

## 2020-10-29 ENCOUNTER — Encounter: Payer: Self-pay | Admitting: *Deleted

## 2020-12-08 ENCOUNTER — Encounter: Payer: Self-pay | Admitting: *Deleted

## 2021-01-19 ENCOUNTER — Telehealth: Payer: Self-pay | Admitting: *Deleted

## 2021-01-19 NOTE — Telephone Encounter (Signed)
Information was sent for PA for Ozempic.to CoverMyMeds and [employer].  PA was approved for Ozempic 01/19/2021 thru 01/19/2022.  Sander Nephew, RN 01/19/2021 9:16 AM.

## 2021-01-20 ENCOUNTER — Other Ambulatory Visit: Payer: Self-pay

## 2021-01-20 ENCOUNTER — Encounter: Payer: Self-pay | Admitting: Internal Medicine

## 2021-01-20 ENCOUNTER — Ambulatory Visit (INDEPENDENT_AMBULATORY_CARE_PROVIDER_SITE_OTHER): Payer: Medicaid Other | Admitting: Internal Medicine

## 2021-01-20 VITALS — BP 128/70 | HR 71 | Temp 97.7°F | Ht 66.0 in | Wt 319.1 lb

## 2021-01-20 DIAGNOSIS — Z52008 Unspecified donor, other blood: Secondary | ICD-10-CM

## 2021-01-20 DIAGNOSIS — I10 Essential (primary) hypertension: Secondary | ICD-10-CM

## 2021-01-20 DIAGNOSIS — E782 Mixed hyperlipidemia: Secondary | ICD-10-CM

## 2021-01-20 DIAGNOSIS — R7303 Prediabetes: Secondary | ICD-10-CM

## 2021-01-20 LAB — POCT GLYCOSYLATED HEMOGLOBIN (HGB A1C): Hemoglobin A1C: 5.5 % (ref 4.0–5.6)

## 2021-01-20 LAB — GLUCOSE, CAPILLARY: Glucose-Capillary: 124 mg/dL — ABNORMAL HIGH (ref 70–99)

## 2021-01-20 NOTE — Assessment & Plan Note (Signed)
Patient is morbidly obese with a BMI of 51.5.  She has been actively dieting and trying to exercise and has lost 12 pounds since her last visit.  I congratulated her on this accomplishment.  She is interested in starting a medication to help with weight loss.  We discussed semaglutide.  It looks like our clinic filled out a prior authorization for this which was approved 01/19/2021 through 01/19/2022 but I do not see any documentation that was ever prescribed.  She reports that she never received a prescription.  She currently does not have any money to afford the co-pay.  I have given her 2 pen samples of Ozempic from our clinic, with instructions to inject 0.25 mg once weekly x4 weeks then increase to 0.5 mg weekly.  Will plan to titrate further based on tolerability and send prescription at her next visit when she is able to afford the co-pay. -- Given samples of Ozempic; start 0.25 mg weekly x4 weeks, then 0.5 mg weekly -- Further titration based on tolerability / affordability  -- Follow up 3 months

## 2021-01-20 NOTE — Assessment & Plan Note (Signed)
Patient donates plasma on a regular basis with a company called [employer].  She brought with her today a consent form for medical record release and clearance to continue donating blood in the setting of "2 out of range alpha-1 test results".  Hand written on the document is an Alpha 1 level of 6.4 with a [facility] range of 2.1-6.1. On chart review, it looks like she was previously worked up for this per their request with normal SPEP, CBC, and iron studies in November of 2021.  After reviewing prior work up I have cleared her to continue donating plasma. Her Alpha 1 level is only mildly elevated and was normal on our SPEP here less than a year ago. Alpha 1 is an acute phase reactant and can be elevated transiently for many reasons. Clinically, there is no concern for acute or chronic inflammation and no further work up is needed. She is medically cleared to continue donating plasma. Form filled out and signed. Our office with scan a copy into her medical record and fax to [facility].

## 2021-01-20 NOTE — Patient Instructions (Addendum)
Ms. Nickoloff,  Keep up the great work with your diet and exercise. I have given you samples of Ozempic to help with weight loss. Please inject 0.25 mg weekly x 4 weeks then increase to 0.5 mg weekly. Follow up with me again in 3 months.   If you have any questions or concerns, call our clinic at [phone] or after hours call [phone] and ask for the internal medicine resident on call.   Thank you!  Dr. Darnell Level

## 2021-01-20 NOTE — Assessment & Plan Note (Signed)
Chronic and well controlled on lisinopril-hydrochlorothiazide 20-25 mg daily.  Blood pressure today is 128/70.  Checking BMP.  Continue current regimen.
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
## 2021-01-20 NOTE — Assessment & Plan Note (Signed)
Patient is taking Lipitor 40 mg for primary prevention.  Lipid panel last checked 4 months ago with total cholesterol of 172 and LDL of 96.  Continue Lipitor.  Repeat lipid panel in 1 year.

## 2021-01-20 NOTE — Assessment & Plan Note (Signed)
Patient was noted to have prediabetes 3 years ago with a hemoglobin A1c of 5.9, however repeated checks since have remained stable, no longer in the prediabetic range.  Hemoglobin A1c today is again stable at 5.5.  She has made multiple positive dietary and lifestyle changes.  She has lost 12 pounds since her last follow-up visit.  We are starting Ozempic for weight loss benefit, see morbid obesity assessment and plan.

## 2021-01-21 LAB — BMP8+ANION GAP
Anion Gap: 17 mmol/L (ref 10.0–18.0)
BUN/Creatinine Ratio: 14 (ref 9–23)
BUN: 10 mg/dL (ref 6–24)
CO2: 22 mmol/L (ref 20–29)
Calcium: 9.4 mg/dL (ref 8.7–10.2)
Chloride: 103 mmol/L (ref 96–106)
Creatinine, Ser: 0.7 mg/dL (ref 0.57–1.00)
Glucose: 109 mg/dL — ABNORMAL HIGH (ref 65–99)
Potassium: 3.7 mmol/L (ref 3.5–5.2)
Sodium: 142 mmol/L (ref 134–144)
eGFR: 100 mL/min/{1.73_m2} (ref >59)

## 2021-01-25 ENCOUNTER — Telehealth: Payer: Self-pay | Admitting: Internal Medicine

## 2021-01-25 NOTE — Telephone Encounter (Signed)
30 g with 3 refills sent 7/16 will forward request for lab results.

## 2021-01-25 NOTE — Telephone Encounter (Signed)
Called patient with results and spoke with her about the clobetasol cream. She has been using this for itching of her vulva and labia, but has not used it intravaginally. Strongly advised patient not to use this on her genitals. It is a high potency steroid and should not be used anywhere near her vagina especially for long periods of time. I suspect her vaginal itching is atrophic vaginitis and she would probably benefit from a topical estrogen. I have asked her to follow up with me again on Wednesday. She would like me to do her PAP smear that day too.

## 2021-01-25 NOTE — Telephone Encounter (Signed)
Refill request and Test results- Pt is requesting a call back about her Lab work.  Pt also has questions about what additional labs she was to have done.  clobetasol cream (TEMOVATE) 0.05 %  [facility] [place], [place] - [address] (Ph: [phone]

## 2021-01-27 ENCOUNTER — Encounter: Payer: Self-pay | Admitting: Internal Medicine

## 2021-01-27 ENCOUNTER — Ambulatory Visit (INDEPENDENT_AMBULATORY_CARE_PROVIDER_SITE_OTHER): Payer: Medicaid Other | Admitting: Internal Medicine

## 2021-01-27 ENCOUNTER — Other Ambulatory Visit: Payer: Self-pay

## 2021-01-27 ENCOUNTER — Ambulatory Visit: Payer: Medicaid Other

## 2021-01-27 ENCOUNTER — Telehealth: Payer: Self-pay | Admitting: *Deleted

## 2021-01-27 ENCOUNTER — Other Ambulatory Visit (HOSPITAL_COMMUNITY)
Admission: RE | Admit: 2021-01-27 | Discharge: 2021-01-27 | Disposition: A | Discharge status: Home / Self Care | Payer: Medicaid Other | Source: Ambulatory Visit | Attending: Internal Medicine | Admitting: Internal Medicine

## 2021-01-27 DIAGNOSIS — R7303 Prediabetes: Secondary | ICD-10-CM

## 2021-01-27 DIAGNOSIS — N952 Postmenopausal atrophic vaginitis: Secondary | ICD-10-CM | POA: Diagnosis not present

## 2021-01-27 DIAGNOSIS — Z124 Encounter for screening for malignant neoplasm of cervix: Secondary | ICD-10-CM | POA: Insufficient documentation

## 2021-01-27 DIAGNOSIS — N898 Other specified noninflammatory disorders of vagina: Secondary | ICD-10-CM | POA: Diagnosis not present

## 2021-01-27 MED ORDER — OZEMPIC (1 MG/DOSE) 4 MG/3ML ~~LOC~~ SOPN: 1.0000 mg | PEN_INJECTOR | SUBCUTANEOUS | 2 refills | Status: DC

## 2021-01-27 MED ORDER — OZEMPIC (0.25 OR 0.5 MG/DOSE) 2 MG/1.5ML ~~LOC~~ SOPN: 1.0000 mg | PEN_INJECTOR | SUBCUTANEOUS | 1 refills | Status: DC

## 2021-01-27 MED ORDER — ESTROGENS, CONJUGATED 0.625 MG/GM VA CREA: 1.0000 | TOPICAL_CREAM | Freq: Every day | VAGINAL | 12 refills | Status: DC

## 2021-01-27 NOTE — Telephone Encounter (Signed)
Call from [place], [place]. Stated they received a rx for Ozempic this morning which will only last 2 weeks. She stated they do have a '1mg'$  dose pen - asking the doctor to send a new rx. Thanks

## 2021-01-27 NOTE — Assessment & Plan Note (Signed)
Patient with a moderate amount of thin white discharge on speculum exam.  Swabbed and sent for testing. -- Follow-up GC, chlamydia, trichomonas, BV, and candida testing

## 2021-01-27 NOTE — Assessment & Plan Note (Signed)
Patient reports chronic vaginal itching and irritation.  She has been using an old prescription of clobetasol cream that was previously prescribed for atopic dermatitis.  Advised patient that she should not use clobetasol on her genitals as it is a high potency steroid and can cause thinning of the vulvular skin when used for long periods of time. I do not seen any evidence of lichen sclerosus on exam which would be one indication for it's use. I suspect her symptoms are atrophic vaginitis and genitourinary symptoms of menopause. Her last menstrual cycle was over 4 years ago. I have prescribed vaginal estrogen cream for her to try. Follow up as needed.

## 2021-01-27 NOTE — Assessment & Plan Note (Signed)
Patient was started on Ozempic last week for weight loss purposes.  She tolerated her first dose well without side effect.  She is requesting an Rx be sent in now that she can afford the co-pay.  Prior authorization was already approved. -- Ozempic Rx sent with instructions to increase to 1 mg dosing after completion of her 0.25 and 0.5 mg taper.

## 2021-01-27 NOTE — Patient Instructions (Addendum)
Ms. Weinberg,  It was a pleasure to see you. I will call you with the results of your testing and PAP smear. I have sent a prescription for the estrogen cream to your pharmacy as well as a prescription for Ozempic.   Follow up with me again in 3 months.   If you have any questions or concerns, call our clinic at [phone] or after hours call [phone] and ask for the internal medicine resident on call.   Thank you!  Dr. Darnell Level
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
## 2021-01-27 NOTE — Telephone Encounter (Signed)
Looks like I sent the wrong pen dose. I have sent a new prescription with the correct strength. Thank you.

## 2021-01-27 NOTE — Assessment & Plan Note (Signed)
Pap smear collected and sent for evaluation with HPV cotesting.

## 2021-01-28 ENCOUNTER — Telehealth: Payer: Self-pay | Admitting: Internal Medicine

## 2021-01-28 LAB — CERVICOVAGINAL ANCILLARY ONLY
Bacterial Vaginitis (gardnerella): POSITIVE — AB
Candida Glabrata: NEGATIVE
Candida Vaginitis: NEGATIVE
Chlamydia: NEGATIVE
Comment: NEGATIVE
Comment: NEGATIVE
Comment: NEGATIVE
Comment: NEGATIVE
Comment: NEGATIVE
Comment: NORMAL
Neisseria Gonorrhea: NEGATIVE
Trichomonas: NEGATIVE

## 2021-01-28 LAB — CYTOLOGY - PAP
Adequacy: ABSENT
Comment: NEGATIVE
Diagnosis: NEGATIVE
High risk HPV: NEGATIVE

## 2021-01-28 NOTE — Telephone Encounter (Signed)
Per Lab staff, pap smears take about 4 business days to result. Patient notified.

## 2021-01-28 NOTE — Telephone Encounter (Signed)
Patient requesting her Pap Smear Results.  Please call patient back.

## 2021-01-29 ENCOUNTER — Telehealth: Payer: Self-pay | Admitting: Internal Medicine

## 2021-01-29 DIAGNOSIS — B9689 Other specified bacterial agents as the cause of diseases classified elsewhere: Secondary | ICD-10-CM

## 2021-01-29 MED ORDER — METRONIDAZOLE 500 MG PO TABS: 500.0000 mg | ORAL_TABLET | Freq: Two times a day (BID) | ORAL | 0 refills | Status: AC

## 2021-01-29 NOTE — Telephone Encounter (Signed)
TELEPHONE ENCOUNTER:   Ms. Dysert contacted the [facility] after-hours pager as the clinic is closed today.  She is requesting the results from her recent Pap smear and notes that she is continuing to have vaginal discharge.  We discussed that her Pap smear did not show any evidence of atypical cells or HPV.  The cervical vaginal swab did show evidence of Gardnerella consistent with bacterial vaginitis, but no evidence of STIs.  Given that she is currently experiencing vaginal discharge, we will go ahead and treat the bacterial vaginitis with Metronidazole.  - Metronidazole 500 mg twice daily for 7 days

## 2021-02-10 NOTE — Telephone Encounter (Signed)
Patient calling in for lab results. Explained A1c is in non-diabetic range. Only abnormal on BMP was slightly elevated glucose, but she was not fasting when blood was drawn.  States she was out of town when metronidazole was called in so she did not pick it up. Continues with vaginal d/c. She will pick up med today and take BID for 7 days. She was very Patent attorney.

## 2021-02-17 DIAGNOSIS — H5213 Myopia, bilateral: Secondary | ICD-10-CM | POA: Diagnosis not present

## 2021-02-22 ENCOUNTER — Other Ambulatory Visit: Payer: Self-pay | Admitting: Internal Medicine

## 2021-02-22 DIAGNOSIS — Z1231 Encounter for screening mammogram for malignant neoplasm of breast: Secondary | ICD-10-CM

## 2021-02-24 ENCOUNTER — Ambulatory Visit
Admission: RE | Admit: 2021-02-24 | Discharge: 2021-02-24 | Disposition: A | Discharge status: Home / Self Care | Payer: Medicaid Other | Source: Ambulatory Visit | Attending: Internal Medicine | Admitting: Internal Medicine

## 2021-02-24 ENCOUNTER — Other Ambulatory Visit: Payer: Self-pay

## 2021-02-24 DIAGNOSIS — Z1231 Encounter for screening mammogram for malignant neoplasm of breast: Secondary | ICD-10-CM

## 2021-03-10 ENCOUNTER — Ambulatory Visit: Payer: Medicaid Other | Admitting: Obstetrics & Gynecology

## 2021-03-10 IMAGING — DX DG HIP (WITH OR WITHOUT PELVIS) 2-3V*L*
3 series · 3 of 3 positions shown · non-contrast
Comparison: Abdomen 01/04/2015.

CLINICAL DATA: Bilateral hip pain.

EXAM:
DG HIP (WITH OR WITHOUT PELVIS) 2-3V LEFT

[t pelvis ap]
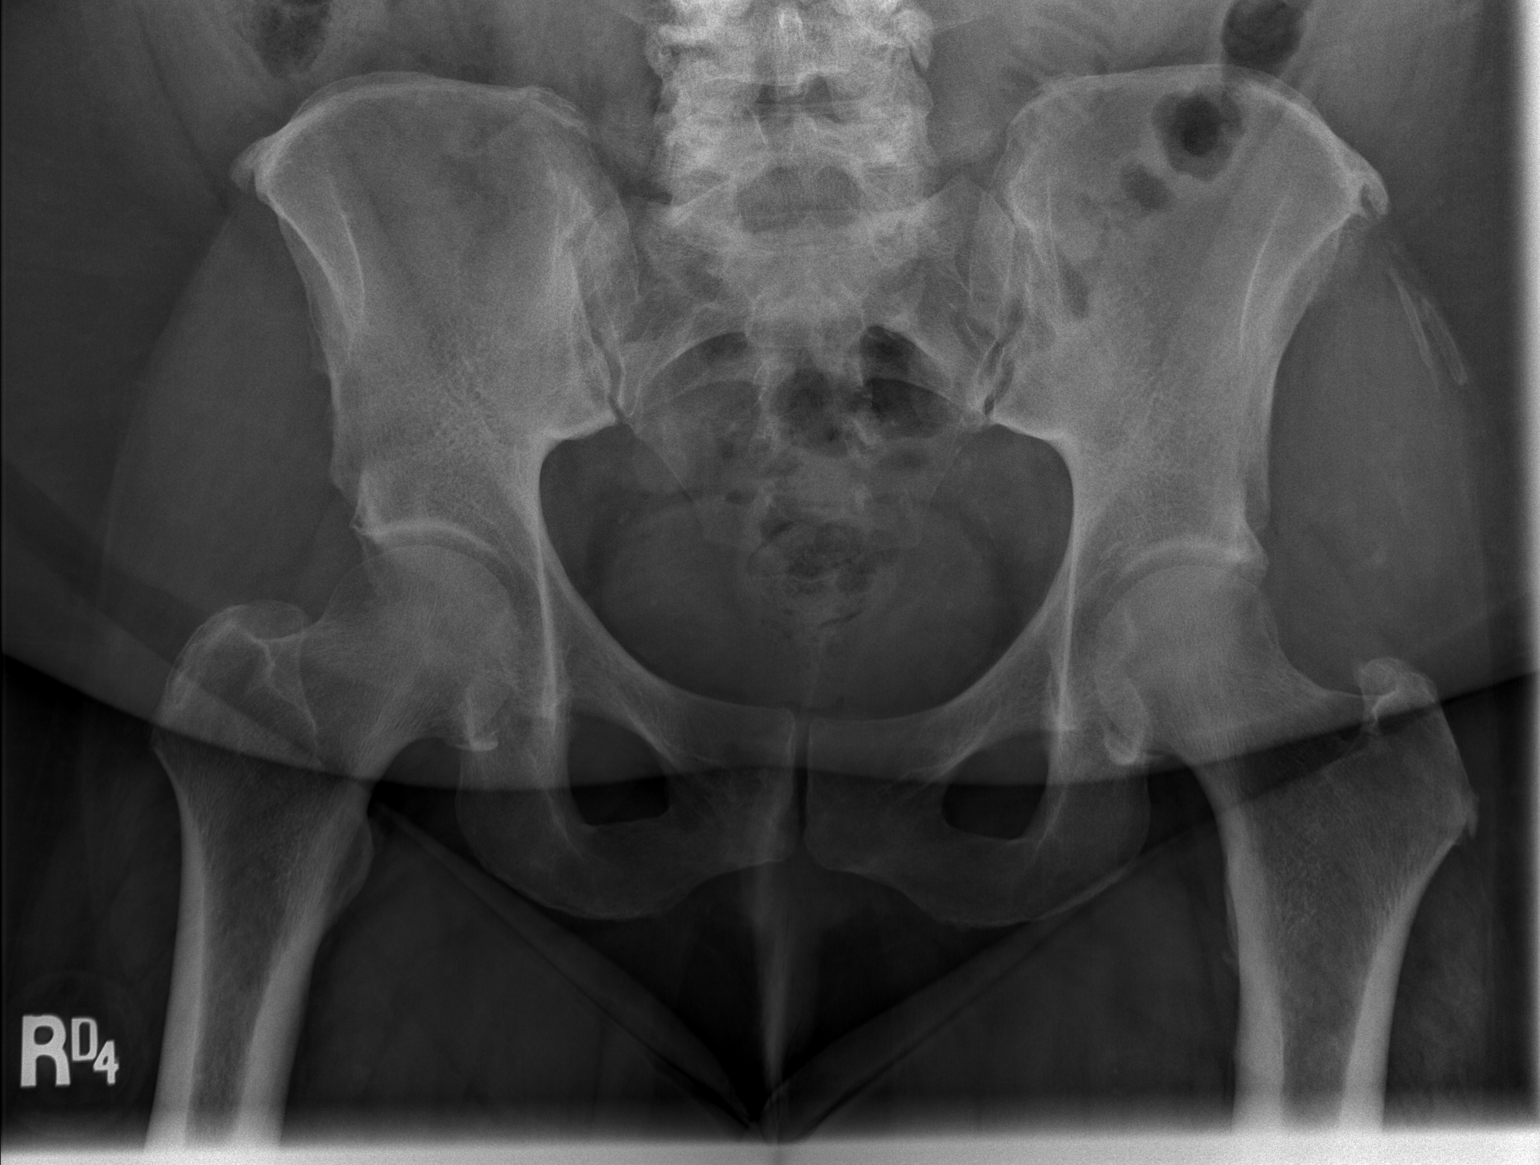

[t hip ap left]
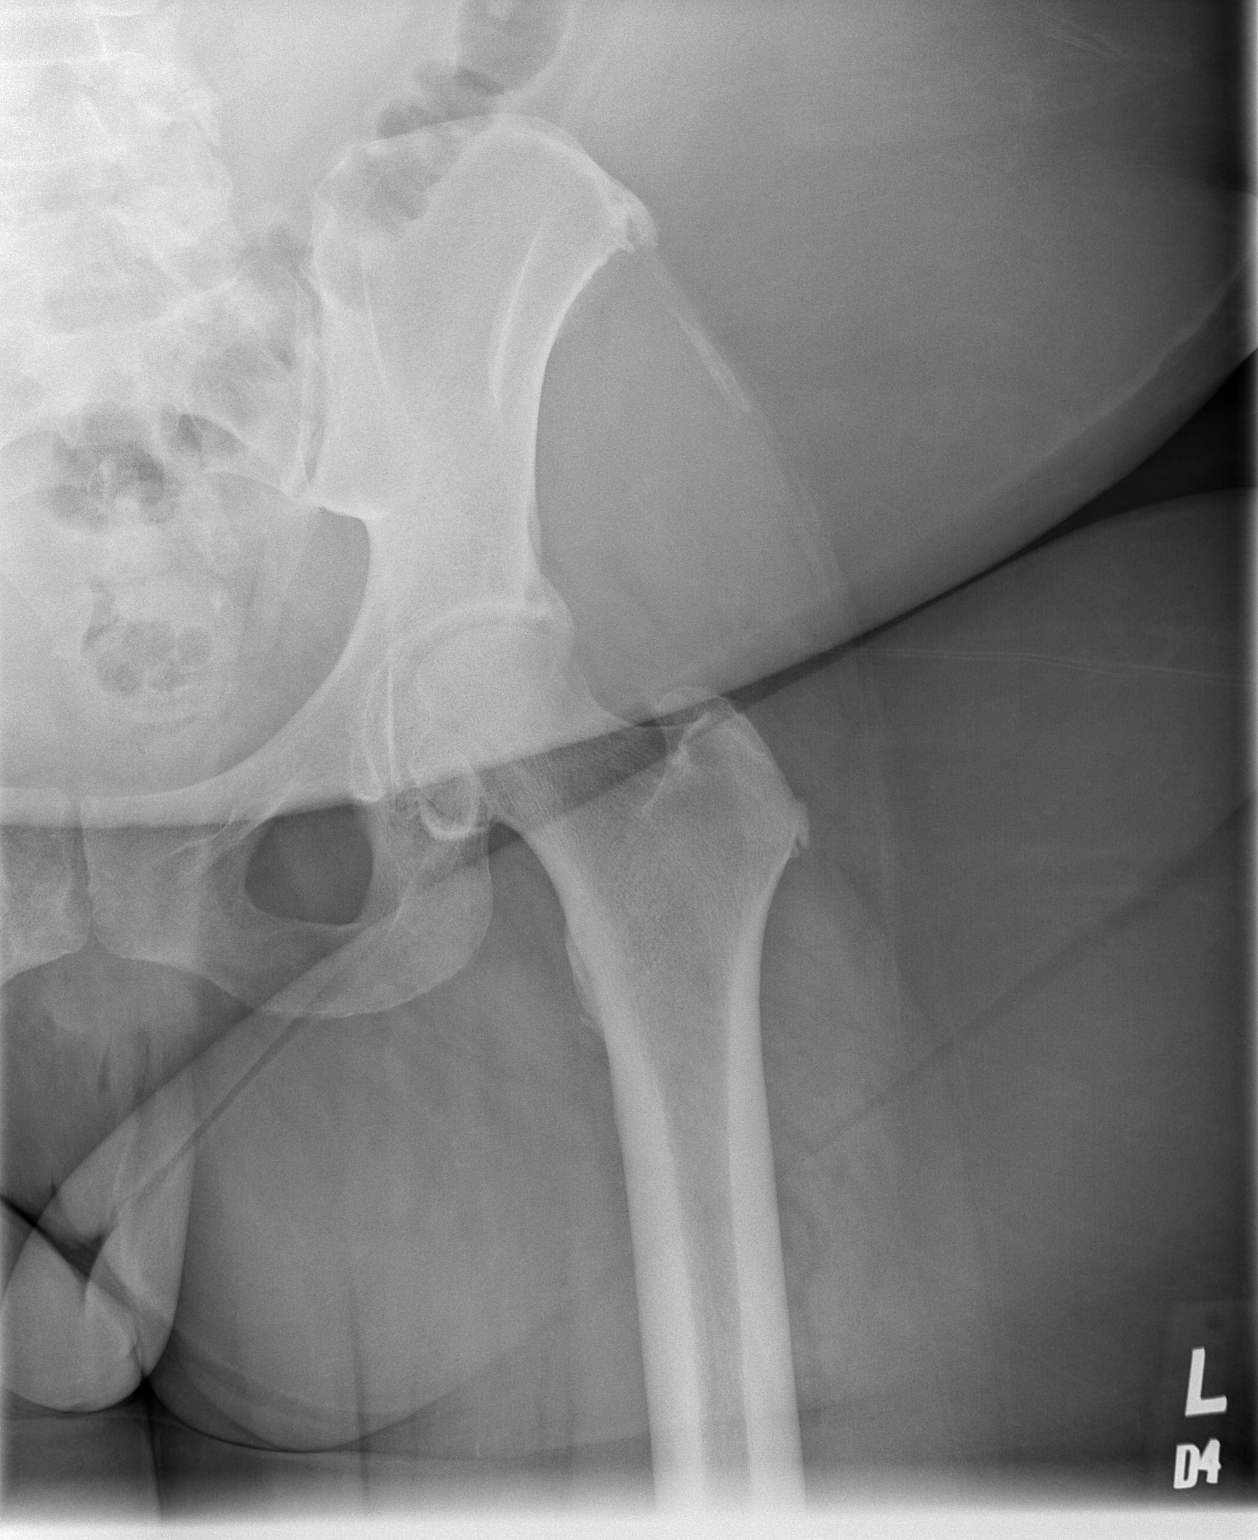

[t hip frog leg left]
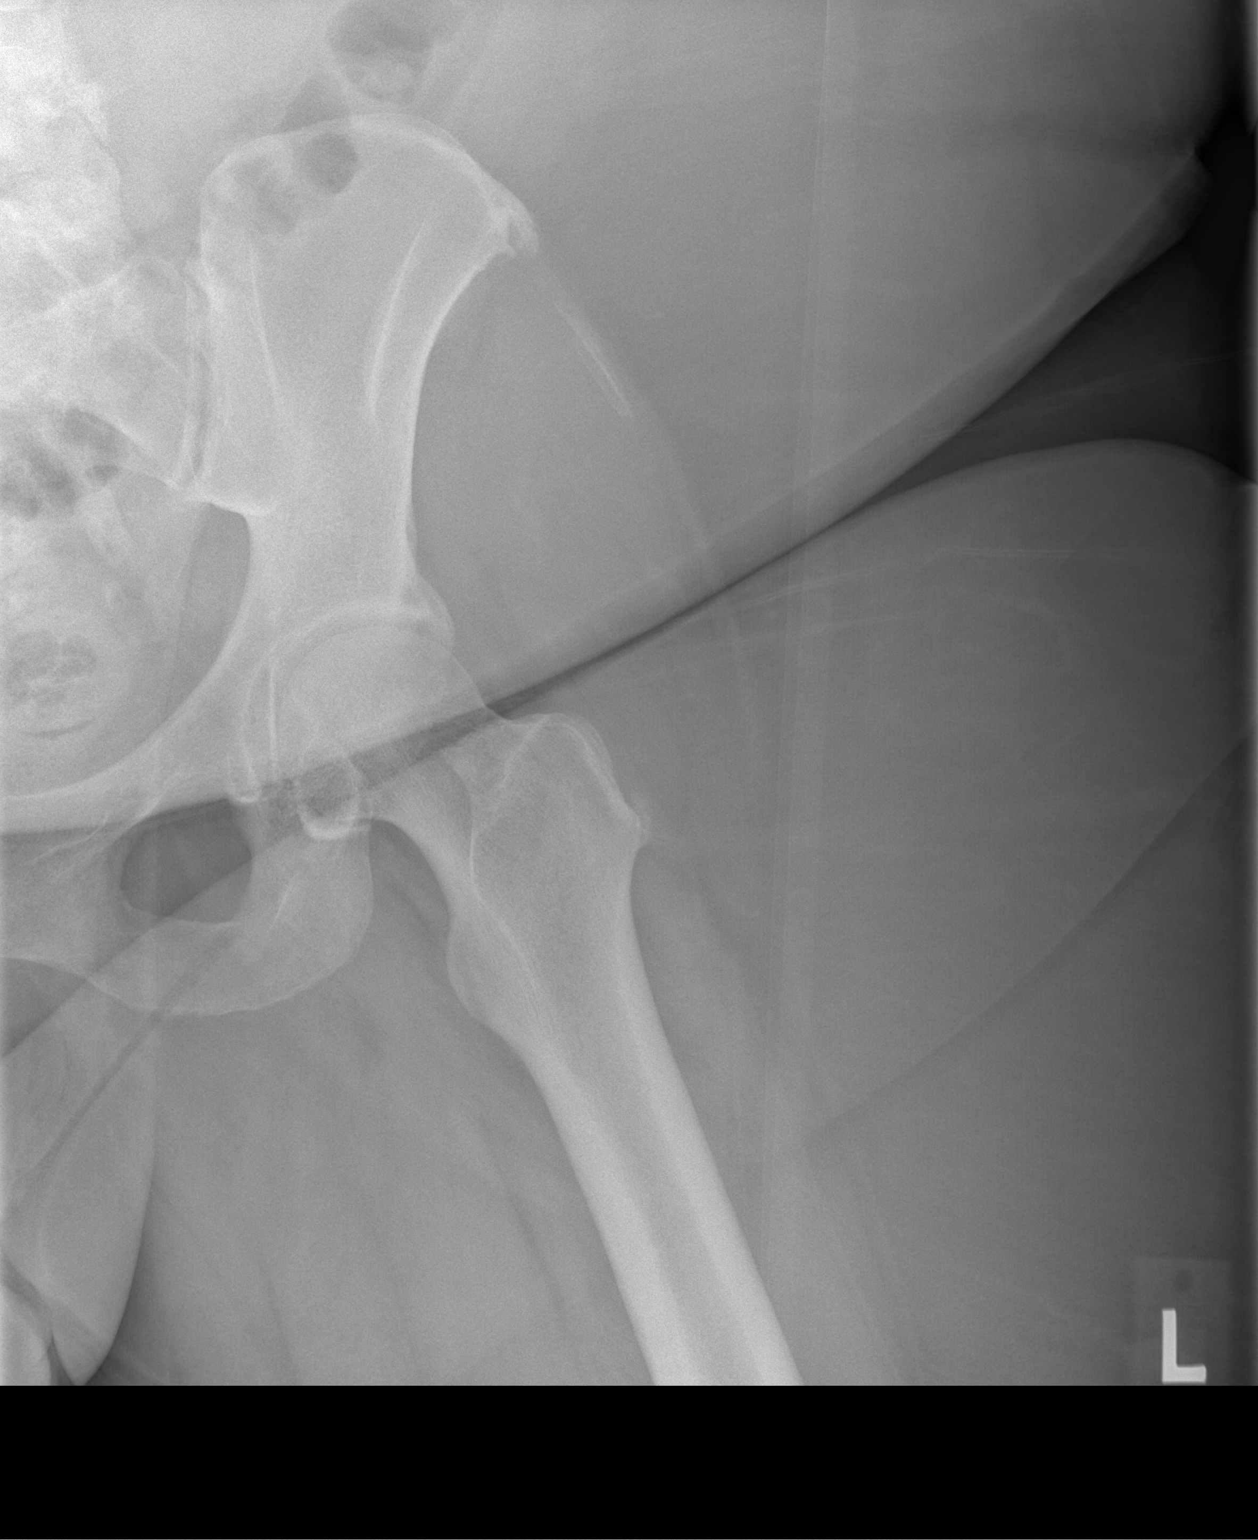

[3 of 3 positions shown; findings below may reference images not displayed]

FINDINGS: Degenerative changes lumbar spine, both SI joints, both hips. No
acute abnormality identified. No evidence of fracture dislocation.
Dystrophic soft tissue calcifications noted over the left pelvis.
IMPRESSION: Diffuse degenerative change.  No acute abnormality.

## 2021-03-15 ENCOUNTER — Telehealth: Payer: Self-pay

## 2021-03-15 NOTE — Telephone Encounter (Signed)
RTC, patient states she has been using Magnesium Citrate Q 3-4 months for a "good cleaning out".  She went to the pharmacy to purchase this, and was told it was recalled and no longer available.  She tried Miralax, but states "it didn't work well and she wants something comparable to the magnesium citrate.  Like a good colonoscopy cleaning out". Pt states she has 1 BM daily.  She states she has tried everything OTC and they "don't work like the Magnesium Citrate" Will send to PCP to advise SChaplin, RN,BSN

## 2021-03-15 NOTE — Telephone Encounter (Signed)
Requesting to speak with a nurse about med for constipation. Please call pt back.

## 2021-03-15 NOTE — Telephone Encounter (Signed)
TC to patient and she was informed of PCP's instructions, states she has tried everything.  Appt made for 03/18/21 @ 1345 with Dr. Coy Saunas. SChaplin, RN,BSN

## 2021-03-15 NOTE — Telephone Encounter (Signed)
One bowel movement a day should be adequate. I would advise her to try OTC dulcolax. If this does not work, we can try a prescription medication like linzess or amitiza. However I would ask she schedule an appointment first.

## 2021-03-15 NOTE — Telephone Encounter (Signed)
Noted  

## 2021-03-17 ENCOUNTER — Other Ambulatory Visit: Payer: Self-pay

## 2021-03-17 ENCOUNTER — Encounter: Payer: Self-pay | Admitting: Student

## 2021-03-17 ENCOUNTER — Ambulatory Visit (INDEPENDENT_AMBULATORY_CARE_PROVIDER_SITE_OTHER): Payer: Medicaid Other | Admitting: Student

## 2021-03-17 VITALS — BP 125/80 | HR 76 | Temp 97.9°F | Ht 66.0 in | Wt 318.9 lb

## 2021-03-17 DIAGNOSIS — K5900 Constipation, unspecified: Secondary | ICD-10-CM

## 2021-03-17 DIAGNOSIS — Z1211 Encounter for screening for malignant neoplasm of colon: Secondary | ICD-10-CM | POA: Insufficient documentation

## 2021-03-17 MED ORDER — SENNA 8.6 MG PO CAPS: 2.0000 | ORAL_CAPSULE | Freq: Two times a day (BID) | ORAL | 1 refills | Status: AC

## 2021-03-17 MED ORDER — POLYETHYLENE GLYCOL 3350 17 G PO PACK: 17.0000 g | PACK | Freq: Every day | ORAL | 1 refills | Status: AC

## 2021-03-17 NOTE — Patient Instructions (Signed)
Thank you, Ms.Lariya Blacketer for allowing [place] to provide your care today. Today we discussed your constipation.  We want you to try senna with MiraLAX consistently for a few days and monitor your symptoms.  Eat a well-balanced diet with increased fiber.  I have place a referrals to GI for colonoscopy   I have ordered the following medication/changed the following medications:  Start senna 2 tablets twice daily as needed for constipation Start MiraLAX 17 g twice daily as needed for constipation  My Chart Access: [email or web address]  Please follow-up in 3 months  Please make sure to arrive 15 minutes prior to your next appointment. If you arrive late, you may be asked to reschedule.    We look forward to seeing you next time. Please call our clinic at [phone] if you have any questions or concerns. The best time to call is Monday-Friday from 9am-4pm, but there is someone available 24/7. If after hours or the weekend, call the main hospital number and ask for the Internal Medicine Resident On-Call. If you need medication refills, please notify your pharmacy one week in advance and they will send [place] a request.   Thank you for letting us take part in your care. Wishing you the best!  Lacinda Axon, MD 03/17/2021, 3:46 PM IM Resident, PGY-2 [facility] 41:10
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
## 2021-03-17 NOTE — Assessment & Plan Note (Addendum)
Patient referred to GI for colonoscopy.  Last colonoscopy in 2013 showed mild diverticulosis in the left colon but no other abnormalities.

## 2021-03-17 NOTE — Assessment & Plan Note (Signed)
Patient presents today for evaluation of constipation. Patient reports that she has had constipation for about 2 weeks. She tried Dulcolax without relief then tried MiraLAX which made her bloated. She did not try mag citrate which regulated her bowel and she was doing well on this regimen. Mag citrate was recently recalled and she has not been able to access this medication. Patient denies any nausea, vomiting, abdominal pain or bloody stools.  She is moving her bowels about once a day but endorsed moderate tenesmus. Patient interested in a stronger bowel regimen to "clean me out" like the colonoscopy regimen. Patient's last colonoscopy in 2013 showed mild diverticulosis in the left colon but otherwise no significant findings. She is not on any opioids or other medications that would cause constipation.  She denies any history of hemorrhoids. No electrolyte abnormalities on a recent BMP.  Symptoms concerning for possible proctitis  Plan: -- Start MiraLAX 17 g daily prn for constipation -- Start senna 8.6 mg, 2 tablets twice daily prn for constipation -- Patient counseled on being consistent on this regimen to help regulate her bowel. -- Encouraged to eat high-fiber diet and drink plenty of water. -- GI referral for colonoscopy sent -- Follow-up in 3 months or as needed

## 2021-03-18 ENCOUNTER — Encounter: Payer: Medicaid Other | Admitting: Student

## 2021-03-18 NOTE — Progress Notes (Signed)
Internal Medicine Clinic Attending  Case discussed with Dr. Amponsah  At the time of the visit.  We reviewed the resident's history and exam and pertinent patient test results.  I agree with the assessment, diagnosis, and plan of care documented in the resident's note.  

## 2021-04-26 ENCOUNTER — Other Ambulatory Visit: Payer: Self-pay | Admitting: Internal Medicine

## 2021-04-26 ENCOUNTER — Telehealth: Payer: Self-pay

## 2021-04-26 DIAGNOSIS — R7303 Prediabetes: Secondary | ICD-10-CM

## 2021-04-26 MED ORDER — LIRAGLUTIDE 18 MG/3ML ~~LOC~~ SOPN: 1.8000 mg | PEN_INJECTOR | Freq: Every day | SUBCUTANEOUS | 2 refills | Status: DC

## 2021-04-26 NOTE — Telephone Encounter (Signed)
Ok, I have sent a new Rx in for liraglutide.  Please let patient know this medication is once a day instead of once a week like ozempic. I have put her on the 1.8 mg dose, which is equivalent to the 1 mg dose of Ozempic that she was taking. Please ask her to schedule follow up with me in the next 3 months. Thank you.

## 2021-04-26 NOTE — Telephone Encounter (Signed)
TC to patient, she was notified of Dr. Rivka Safer instructions:  Ok, I have sent a new Rx in for liraglutide.  Please let patient know this medication is once a day instead of once a week like ozempic. I have put her on the 1.8 mg dose, which is equivalent to the 1 mg dose of Ozempic that she was taking. Please ask her to schedule follow up with me in the next 3 months. Thank you.         She verbalized understanding. SChaplin, RN,BSN

## 2021-04-26 NOTE — Telephone Encounter (Signed)
Patient calling because she is unable to get a refill on her Semaglutide.  She states pharmacy doesn't have it and she was due for an injection last week. RN encouraged patient to call other pharmacies to check if they had any in stock and she refuses.  She wants MD to give her an alternative medication.   RN called [facility] and pharmacist states there is a Psychologist, prison and probation services on the [employer] until 12/2. Forwarding to PCP. SChaplin, RN,BSN

## 2021-05-05 ENCOUNTER — Ambulatory Visit
Admission: EM | Admit: 2021-05-05 | Discharge: 2021-05-05 | Disposition: A | Discharge status: Home / Self Care | Payer: Medicaid Other | Attending: Physician Assistant | Admitting: Physician Assistant

## 2021-05-05 ENCOUNTER — Other Ambulatory Visit: Payer: Self-pay

## 2021-05-05 ENCOUNTER — Encounter: Payer: Self-pay | Admitting: Emergency Medicine

## 2021-05-05 DIAGNOSIS — H1033 Unspecified acute conjunctivitis, bilateral: Secondary | ICD-10-CM

## 2021-05-05 DIAGNOSIS — J069 Acute upper respiratory infection, unspecified: Secondary | ICD-10-CM

## 2021-05-05 MED ORDER — GENTAMICIN SULFATE 0.3 % OP SOLN: 1.0000 [drp] | OPHTHALMIC | 0 refills | Status: AC
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
## 2021-05-05 NOTE — ED Triage Notes (Signed)
Nasal congestion, cough, nausea, headaches since Saturday. Has been using over the counter medications without relief. Denies fever, emesis, diarrhea

## 2021-05-06 LAB — COVID-19, FLU A+B NAA
Influenza A, NAA: NOT DETECTED
Influenza B, NAA: NOT DETECTED
SARS-CoV-2, NAA: NOT DETECTED

## 2021-05-07 ENCOUNTER — Ambulatory Visit (INDEPENDENT_AMBULATORY_CARE_PROVIDER_SITE_OTHER): Payer: Medicaid Other | Admitting: Internal Medicine

## 2021-05-07 ENCOUNTER — Ambulatory Visit (HOSPITAL_COMMUNITY)
Admission: RE | Admit: 2021-05-07 | Discharge: 2021-05-07 | Disposition: A | Discharge status: Home / Self Care | Payer: Medicaid Other | Source: Ambulatory Visit | Attending: Internal Medicine | Admitting: Internal Medicine

## 2021-05-07 ENCOUNTER — Other Ambulatory Visit: Payer: Self-pay

## 2021-05-07 ENCOUNTER — Encounter: Payer: Self-pay | Admitting: Internal Medicine

## 2021-05-07 VITALS — BP 114/79 | HR 76 | Temp 98.5°F | Resp 20 | Ht 66.0 in | Wt 310.8 lb

## 2021-05-07 DIAGNOSIS — J0140 Acute pansinusitis, unspecified: Secondary | ICD-10-CM | POA: Insufficient documentation

## 2021-05-07 DIAGNOSIS — R059 Cough, unspecified: Secondary | ICD-10-CM | POA: Diagnosis not present

## 2021-05-07 DIAGNOSIS — J069 Acute upper respiratory infection, unspecified: Secondary | ICD-10-CM

## 2021-05-07 MED ORDER — DOXYCYCLINE HYCLATE 100 MG PO TABS: 100.0000 mg | ORAL_TABLET | Freq: Two times a day (BID) | ORAL | 0 refills | Status: DC

## 2021-05-07 NOTE — Progress Notes (Signed)
   CC: uncomplicated acute sinusitis  HPI:[name] is a 60 y.o. female who presents for evaluation of uncomplicated acute sinusitis. Please see individual problem based A/P for details.  Review of Systems:   Review of Systems  Constitutional:  Negative for chills and fever.  HENT:  Positive for congestion and sinus pain. Negative for ear discharge and ear pain.   Respiratory:  Positive for cough and sputum production.     Physical Exam: Vitals:   05/07/21 0924  BP: 114/79  Pulse: 76  Resp: 20  Temp: 98.5 F (36.9 C)  TempSrc: Oral  SpO2: 100%  Weight: (!) 310 lb 12.8 oz (141 kg)  Height: 5\' 6"  (1.676 m)   General: Well kept, NAD, severe obesity. Congested while speaking  ENT: Conjunctiva injected ( patient reports improving) , antiicteric sclerae, moist mucous membranes, no exudate , she does have erythema of oropharynx Cardiovascular: Normal rate, regular rhythm.  No murmurs, rubs, or gallops Pulmonary : Equal breath sounds, No wheezes, rales, or rhonchi  Assessment & Plan:   See Encounters Tab for problem based charting.  Patient discussed with Dr.  Saverio Danker

## 2021-05-07 NOTE — Patient Instructions (Addendum)
Thank you for trusting me with your care. To recap, today we discussed the following:   Upper respiratory infection with cough and congestion - Doxycycline 100 mg twice daily for 5 days  - DG Chest 2 View

## 2021-05-08 ENCOUNTER — Encounter: Payer: Self-pay | Admitting: Internal Medicine

## 2021-05-08 DIAGNOSIS — J329 Chronic sinusitis, unspecified: Secondary | ICD-10-CM | POA: Insufficient documentation

## 2021-05-08 DIAGNOSIS — B9689 Other specified bacterial agents as the cause of diseases classified elsewhere: Secondary | ICD-10-CM | POA: Insufficient documentation

## 2021-05-08 MED ORDER — FLUTICASONE PROPIONATE 50 MCG/ACT NA SUSP: 1.0000 | Freq: Every day | NASAL | 0 refills | Status: DC

## 2021-05-08 NOTE — Assessment & Plan Note (Signed)
  HPI: Rebekah Howard started having symptoms approximately 7 days ago and evaluated on 11/30 at urgent care. Her symptoms are nasal congestion, eye redness, and cough. She has had associated symptoms of intermediate headache and nausea. No fever or chills. She has been taking acetaminophen to help with symptoms, and finds improvement with headache. She was diagnosed with bilateral conjunctivitis and prescribed antibiotic drops at urgent care. Her congestion is getting worse, she has a productive cough, and she made appointment for further evaluation today. Feels some chest tightness at night, but denies difficulty breathing.   Assessment/Plan:  Uncomplicated acute sinusitis. Patient with seven days of symptoms and not improving. Will prescribe antibiotic, patient is allergic to penicillin. Reports taking penicillin as an adult and having urticaria.  Recommended treating symptoms with acetaminophen and fluticasone. Auscultation of lungs was clear, but some limitation due to habitus. Expect patients cough maybe from postnasal drip given her main symptom is upper airway congestion and she has erythematous oral pharynx. Will send for chest xray to rule out pneumonia.  - doxycycline (VIBRA-TABS) 100 MG tablet; Take 1 tablet (100 mg total) by mouth 2 (two) times daily.  Dispense: 10 tablet; Refill: 0 - DG Chest 2 View - fluticasone (FLONASE) 50 MCG/ACT nasal spray; Place 1 spray into both nostrils daily.  Dispense: 11.1 mL; Refill: 0

## 2021-05-10 ENCOUNTER — Telehealth: Payer: Self-pay

## 2021-05-10 ENCOUNTER — Other Ambulatory Visit: Payer: Self-pay | Admitting: Internal Medicine

## 2021-05-10 DIAGNOSIS — Z59819 Housing instability, housed unspecified: Secondary | ICD-10-CM

## 2021-05-10 NOTE — Progress Notes (Signed)
Internal Medicine Clinic Attending  Case discussed with Dr. Steen  At the time of the visit.  We reviewed the resident's history and exam and pertinent patient test results.  I agree with the assessment, diagnosis, and plan of care documented in the resident's note.  

## 2021-05-10 NOTE — Progress Notes (Signed)
See telephone note.

## 2021-05-10 NOTE — Telephone Encounter (Signed)
Requesting to speak with a nurse about lab results, please call pt back.
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
## 2021-05-11 ENCOUNTER — Other Ambulatory Visit: Payer: Self-pay | Admitting: Internal Medicine

## 2021-05-11 DIAGNOSIS — Z59819 Housing instability, housed unspecified: Secondary | ICD-10-CM
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
## 2021-05-12 ENCOUNTER — Other Ambulatory Visit: Payer: Self-pay | Admitting: Licensed Clinical Social Worker

## 2021-05-12 ENCOUNTER — Encounter: Payer: Self-pay | Admitting: Internal Medicine

## 2021-05-12 DIAGNOSIS — F418 Other specified anxiety disorders: Secondary | ICD-10-CM
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
## 2021-05-13 NOTE — Patient Instructions (Signed)
Visit Information  Rebekah Howard was given information about Medicaid Managed Care team care coordination services as a part of their Healthy Blue Medicaid benefit. Rebekah Howard verbally consented to engagement with the [facility] Managed Care team.   If you are experiencing a medical emergency, please call 911 or report to your local emergency department or urgent care.   If you have a non-emergency medical problem during routine business hours, please contact your provider's office and ask to speak with a nurse.   For questions related to your Healthy [facility] health plan, please call: [phone] or visit the homepage here: [email or web address]  If you would like to schedule transportation through your Healthy [facility] plan, please call the following number at least 2 days in advance of your appointment: [phone]  Call the [facility] at [phone], at any time, 24 hours a day, 7 days a week. If you are in danger or need immediate medical attention call 911.  If you would like help to quit smoking, call 1-800-QUIT-NOW 937-426-0479) OR Espaol: 1-855-Djelo-Ya (2-122-482-5003) o para ms informacin haga clic aqu or Text READY to 200-400 to register via text   Following is a copy of your plan of care:  Care Plan : General Social Work (Adult)  Updates made by Greg Cutter, LCSW since 05/13/2021 12:00 AM     Problem: Depression Identification (Depression)      Long-Range Goal: Depressive Symptoms Identified   Start Date: 05/12/2021  This Visit's Progress: On track  Note:   Timeframe:  Long-Range Goal Priority:  High Start Date:   05/12/21                   Expected End Date:  ongoing                     Follow Up Date--05/24/21   - check out counseling - keep 90 percent of counseling appointments - schedule counseling appointment    Why is this important?   Beating depression may take some time.   If you don't feel better right away, don't give up on your treatment plan.    Current barriers:   Chronic Mental Health needs related to depression and anxiety Mental Health Concerns  Needs Support, Education, and Care Coordination in order to meet unmet mental health needs. Clinical Goal(s): demonstrate a reduction in symptoms related to :Anxiety and Depression, connect with provider for ongoing mental health treatment.  , and increase coping skills, healthy habits, self-management skills, and stress reduction     Patient Goals/Self-Care Activities: Over the next 120 days Call your insurance provider for more information about your Enhanced Benefits   Eula Fried, BSW, MSW, [employer] Managed Medicaid LCSW [email or web address] Phone: [phone]

## 2021-05-24 ENCOUNTER — Other Ambulatory Visit: Payer: Self-pay | Admitting: Licensed Clinical Social Worker
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
## 2021-05-24 NOTE — Patient Instructions (Signed)
Visit Information  Rebekah Howard was given information about Medicaid Managed Care team care coordination services as a part of their Healthy Blue Medicaid benefit. Rebekah Howard verbally consented to engagement with the [facility] Managed Care team.   If you are experiencing a medical emergency, please call 911 or report to your local emergency department or urgent care.   If you have a non-emergency medical problem during routine business hours, please contact your provider's office and ask to speak with a nurse.   For questions related to your Healthy [facility] health plan, please call: [phone] or visit the homepage here: [email or web address]  If you would like to schedule transportation through your Healthy [facility] plan, please call the following number at least 2 days in advance of your appointment: [phone]  Call the [place] at [phone], at any time, 24 hours a day, 7 days a week. If you are in danger or need immediate medical attention call 911.  If you would like help to quit smoking, call 1-800-QUIT-NOW 404-418-0581) OR Espaol: 1-855-Djelo-Ya (2-620-355-9741) o para ms informacin haga clic aqu or Text READY to 200-400 to register via text  Following is a copy of your plan of care:  Care Plan : General Social Work (Adult)  Updates made by Greg Cutter, LCSW since 05/24/2021 12:00 AM     Problem: Depression Identification (Depression)      Long-Range Goal: Depressive Symptoms Identified   Start Date: 05/12/2021  Recent Progress: On track  Note:   Timeframe:  Long-Range Goal Priority:  High Start Date:   05/12/21                   Expected End Date:  05/24/21   - check out counseling - keep 90 percent of counseling appointments - schedule counseling appointment    Why is this important?   Beating depression may take some time.  If you don't feel better right away, don't give up on  your treatment plan.    Current barriers:   Chronic Mental Health needs related to depression and anxiety Mental Health Concerns  Needs Support, Education, and Care Coordination in order to meet unmet mental health needs. Clinical Goal(s): demonstrate a reduction in symptoms related to :Anxiety and Depression, connect with provider for ongoing mental health treatment.  , and increase coping skills, healthy habits, self-management skills, and stress reduction     Patient denies any current crises or urgent needs Patient Goals/Self-Care Activities: Over the next 120 days Call your insurance provider for more information about your Enhanced Benefits   Eula Fried, BSW, MSW, [employer] Managed Medicaid LCSW [email or web address] Phone: [phone]

## 2021-05-28 ENCOUNTER — Other Ambulatory Visit: Payer: Self-pay
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
## 2021-05-28 NOTE — Patient Instructions (Signed)
Visit Information  Rebekah Howard was given information about Medicaid Managed Care team care coordination services as a part of their Healthy Blue Medicaid benefit. [name] verbally consented to engagement with the [facility] Managed Care team.   If you are experiencing a medical emergency, please call 911 or report to your local emergency department or urgent care.   If you have a non-emergency medical problem during routine business hours, please contact your provider's office and ask to speak with a nurse.   For questions related to your Healthy [facility] health plan, please call: [phone] or visit the homepage here: [email or web address]  If you would like to schedule transportation through your Healthy [facility] plan, please call the following number at least 2 days in advance of your appointment: [phone]  Call the [place] at [phone], at any time, 24 hours a day, 7 days a week. If you are in danger or need immediate medical attention call 911.  If you would like help to quit smoking, call 1-800-QUIT-NOW 610 120 2850) OR Espaol: 1-855-Djelo-Ya (3-875-643-3295) o para ms informacin haga clic aqu or Text READY to 200-400 to register via text  Ms. Pasquarella - following are the goals we discussed in your visit today:   Goals Addressed   None     Social Worker will follow up with patient in 10 days.   Mickel Fuchs, BSW, [place] Managed Medicaid Team  [phone]   Following is a copy of your plan of care:  There are no care plans that you recently modified to display for this patient.

## 2021-06-08 ENCOUNTER — Institutional Professional Consult (permissible substitution): Payer: Medicaid Other | Admitting: Behavioral Health

## 2021-06-11 ENCOUNTER — Other Ambulatory Visit: Payer: Self-pay
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
## 2021-06-14 ENCOUNTER — Telehealth: Payer: Self-pay | Admitting: *Deleted
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
## 2021-06-16 ENCOUNTER — Telehealth: Payer: Self-pay

## 2021-06-16 NOTE — Patient Outreach (Signed)
Care Coordination  06/16/2021  Rebekah Howard [birth year] [id number]  BSW completed telephone call with patient regarding housing. Patient stated the apartment she had lined up did not go through and she will need somewhere to go by the end of the month. Patient states she would like some resources for housing. BSW will put together a list of resources and reach out to the Care guide for an update on contacting [place] apartments.   Mickel Fuchs, BSW, [place] Managed Medicaid Team  [phone]

## 2021-06-16 NOTE — Patient Instructions (Signed)
Visit Information  [name] was given information about Medicaid Managed Care team care coordination services as a part of their Healthy [employer] Medicaid benefit. Rebekah Howard verbally consented to engagement with the [facility] Managed Care team.   If you are experiencing a medical emergency, please call 911 or report to your local emergency department or urgent care.   If you have a non-emergency medical problem during routine business hours, please contact your provider's office and ask to speak with a nurse.   For questions related to your Healthy [facility] health plan, please call: [phone] or visit the homepage here: [email or web address]  If you would like to schedule transportation through your Healthy [facility] plan, please call the following number at least 2 days in advance of your appointment: [phone]  Call the [place] at [phone], at any time, 24 hours a day, 7 days a week. If you are in danger or need immediate medical attention call 911.  If you would like help to quit smoking, call 1-800-QUIT-NOW 816-049-8759) OR Espaol: 1-855-Djelo-Ya (5-953-967-2897) o para ms informacin haga clic aqu or Text READY to 200-400 to register via text  [name] - following are the goals we discussed in your visit today:   Goals Addressed   None       Social Worker will follow up with housing resources.   Mickel Fuchs, BSW, [place] Managed Medicaid Team  [phone]   Following is a copy of your plan of care:  There are no care plans that you recently modified to display for this patient.

## 2021-06-17 ENCOUNTER — Telehealth: Payer: Self-pay | Admitting: *Deleted

## 2021-06-17 NOTE — Telephone Encounter (Signed)
Patient was denied housing at [address] but they are returning her deposit claiming her case was not emergent have left a message at the complex , patient will be emailed other housing information at [email or web address]

## 2021-06-17 NOTE — Telephone Encounter (Signed)
° °  Telephone encounter was:  Successful.  06/17/2021 Name: Rozlyn Yerby MRN: [id number] DOB: 01/20/1961  Locklyn Henriquez is a 61 y.o. year old female who is a primary care patient of [name], MD . The community resource team was consulted for assistance with Patient was denied housing [address] but they are returning her deposit claiming her case was not emergent have left a message at the complex , patient will be emailed other housing information at [email or web address]  Care guide performed the following interventions: Patient provided with information about care guide support team and interviewed to confirm resource needs Follow up call placed to community resources to determine status of patients referral.  Follow Up Plan:  Care guide will outreach resources to assist patient with Housing and mediation   [facility], Care Management  [phone] [address] , [place] [address] Email : [email or web address]

## 2021-06-25 ENCOUNTER — Other Ambulatory Visit: Payer: Self-pay

## 2021-06-25 NOTE — Patient Instructions (Signed)
Visit Information  [name]  - as a part of your Medicaid benefit, you are eligible for care management and care coordination services at no cost or copay. I was unable to reach you by phone today but would be happy to help you with your health related needs. Please feel free to call me @ [phone]  A member of the Managed Medicaid care management team will reach out to you again over the next 30 days.  Mickel Fuchs, BSW, [place] Managed Medicaid Team  [phone]

## 2021-06-25 NOTE — Patient Outreach (Signed)
Care Coordination  06/25/2021  Rebekah Howard 10-09-1960 [id number]   Medicaid Managed Care   Unsuccessful Outreach Note  06/25/2021 Name: Rebekah Howard MRN: [id number] DOB: [birth year]  Referred by: [name], MD Reason for referral : High Risk Managed Medicaid (MM Social Work Unsuccessful [employer])   An unsuccessful telephone outreach was attempted today. The patient was referred to the case management team for assistance with care management and care coordination.   Follow Up Plan: The care management team will reach out to the patient again over the next 30 days.   Rebekah Howard, BSW, [place] Managed Medicaid Team  [phone]

## 2021-07-01 ENCOUNTER — Institutional Professional Consult (permissible substitution): Payer: Medicaid Other | Admitting: Behavioral Health

## 2021-07-14 ENCOUNTER — Encounter: Payer: Medicaid Other | Admitting: Internal Medicine

## 2021-07-19 ENCOUNTER — Other Ambulatory Visit: Payer: Self-pay | Admitting: Internal Medicine

## 2021-07-19 DIAGNOSIS — I10 Essential (primary) hypertension: Secondary | ICD-10-CM

## 2021-07-22 NOTE — Telephone Encounter (Signed)
Approved lisinopril/ HCTZ refill. Please have patient schedule follow up with me. Thanks.

## 2021-07-24 ENCOUNTER — Other Ambulatory Visit: Payer: Self-pay | Admitting: Student

## 2021-07-24 DIAGNOSIS — E785 Hyperlipidemia, unspecified: Secondary | ICD-10-CM

## 2021-07-26 NOTE — Telephone Encounter (Signed)
Approved lipitor refill. Please have patient schedule follow up with me.

## 2021-08-05 ENCOUNTER — Encounter: Payer: Self-pay | Admitting: Emergency Medicine

## 2021-08-05 ENCOUNTER — Other Ambulatory Visit: Payer: Self-pay

## 2021-08-05 ENCOUNTER — Ambulatory Visit
Admission: EM | Admit: 2021-08-05 | Discharge: 2021-08-05 | Disposition: A | Discharge status: Home / Self Care | Payer: Medicaid Other | Attending: Internal Medicine | Admitting: Internal Medicine

## 2021-08-05 DIAGNOSIS — R112 Nausea with vomiting, unspecified: Secondary | ICD-10-CM | POA: Diagnosis not present

## 2021-08-05 DIAGNOSIS — Z20822 Contact with and (suspected) exposure to covid-19: Secondary | ICD-10-CM | POA: Diagnosis not present

## 2021-08-05 MED ORDER — ONDANSETRON 4 MG PO TBDP: 4.0000 mg | ORAL_TABLET | Freq: Three times a day (TID) | ORAL | 0 refills | Status: AC | PRN

## 2021-08-05 NOTE — Discharge Instructions (Signed)
It appears that you may have a viral stomach virus.  You have been sent nausea medication to take as needed.  Please increase clear oral fluid intake to prevent dehydration.  Covid test is pending. ?

## 2021-08-05 NOTE — ED Triage Notes (Signed)
Patient states that after eating dinner last night, she did have a BM.  About 3am this morning she began vomiting.  Patient is requesting a COVID test due to her keeping her infant grandson.  Afebrile. ?
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
## 2021-08-06 LAB — NOVEL CORONAVIRUS, NAA: SARS-CoV-2, NAA: NOT DETECTED

## 2021-08-10 ENCOUNTER — Other Ambulatory Visit: Payer: Self-pay | Admitting: Internal Medicine

## 2021-08-26 ENCOUNTER — Encounter: Payer: Self-pay | Admitting: Physician Assistant

## 2021-09-08 ENCOUNTER — Ambulatory Visit: Payer: Medicaid Other | Admitting: Physician Assistant

## 2021-09-15 ENCOUNTER — Encounter: Payer: Medicaid Other | Admitting: Internal Medicine

## 2021-09-17 ENCOUNTER — Encounter: Payer: Self-pay | Admitting: Internal Medicine

## 2021-09-30 ENCOUNTER — Ambulatory Visit: Payer: Medicaid Other | Admitting: Physician Assistant

## 2021-10-26 ENCOUNTER — Other Ambulatory Visit: Payer: Self-pay | Admitting: Internal Medicine

## 2021-10-28 ENCOUNTER — Ambulatory Visit: Payer: Medicaid Other | Admitting: Physician Assistant

## 2021-11-29 ENCOUNTER — Encounter: Payer: Self-pay | Admitting: Internal Medicine

## 2021-12-08 ENCOUNTER — Telehealth: Payer: Self-pay | Admitting: *Deleted

## 2021-12-08 NOTE — Telephone Encounter (Signed)
Patient notified she will have a direct colon at hospital. She is aware the colon at [facility] and [facility] cancelled at this time and the office nurse will call her back with hospital appointment date/time and new PV closer to that date.

## 2021-12-08 NOTE — Telephone Encounter (Signed)
Can be direct JMP

## 2021-12-08 NOTE — Telephone Encounter (Signed)
Dr.Pyrtle,  Patient is scheduled for a recall colon with you. Her last colon was 11/15/2011. I confirmed pt's weight=316lbs per pt. Her BMI=51. H/O HTN, constipation. Okay for direct hospital colonoscopy or OV? Please advise. Thank you, Taquana Bartley pv

## 2021-12-21 ENCOUNTER — Other Ambulatory Visit: Payer: Self-pay

## 2021-12-21 DIAGNOSIS — Z1211 Encounter for screening for malignant neoplasm of colon: Secondary | ICD-10-CM

## 2021-12-21 NOTE — Telephone Encounter (Signed)
Pt scheduled for previsit 02/02/22 at 4pm, Colon scheduled at [facility] 02/14/22 at 9:45am. Pt aware of appts.

## 2022-01-14 ENCOUNTER — Other Ambulatory Visit: Payer: Self-pay | Admitting: Internal Medicine

## 2022-01-14 ENCOUNTER — Encounter: Payer: Medicaid Other | Admitting: Internal Medicine

## 2022-02-02 ENCOUNTER — Ambulatory Visit (AMBULATORY_SURGERY_CENTER): Payer: Medicaid Other | Admitting: *Deleted

## 2022-02-02 ENCOUNTER — Telehealth: Payer: Self-pay | Admitting: Internal Medicine

## 2022-02-02 VITALS — Ht 66.0 in | Wt 315.0 lb

## 2022-02-02 DIAGNOSIS — Z1211 Encounter for screening for malignant neoplasm of colon: Secondary | ICD-10-CM

## 2022-02-02 MED ORDER — PLENVU 140 G PO SOLR
1.0000 | Freq: Once | ORAL | 0 refills | Status: AC
Start: 2022-02-02 — End: 2022-02-02

## 2022-02-02 NOTE — Progress Notes (Signed)
No egg or soy allergy known to patient  No issues known to pt with past sedation with any surgeries or procedures Patient denies ever being told they had issues or difficulty with intubation  No FH of Malignant Hyperthermia Pt is not on diet pills Pt is not on  home 02  Pt is not on blood thinners  Pt has issues with constipation  No A fib or A flutter Have any cardiac testing pending--no Pt instructed to use Singlecare.com or GoodRx for a price reduction on prep   

## 2022-02-02 NOTE — Telephone Encounter (Signed)
Patient came in for her previsit today.  She is having her procedure at the hospital on 9/11; however, her son is not available until later that day.  She is asking if there is a time somewhere around 2 p.m. that same day.  Otherwise, she will probably need to reschedule.  Please call patient and advise.  Thank you.

## 2022-02-02 NOTE — Telephone Encounter (Signed)
Let pt know that the hosp appts are only in the mornings. She will try to find transportation, if she cannot she will have to cancel the appt. Instructed pt to let us know.

## 2022-02-04 ENCOUNTER — Encounter (HOSPITAL_COMMUNITY): Payer: Self-pay | Admitting: Internal Medicine

## 2022-02-04 NOTE — Progress Notes (Signed)
Attempted to obtain medical history via telephone, unable to reach at this time. HIPAA compliant voicemail message left requesting return call to pre surgical testing department. 

## 2022-02-11 ENCOUNTER — Encounter (HOSPITAL_COMMUNITY): Payer: Self-pay | Admitting: Registered Nurse

## 2022-02-13 ENCOUNTER — Ambulatory Visit
Admission: EM | Admit: 2022-02-13 | Discharge: 2022-02-13 | Disposition: A | Discharge status: Home / Self Care | Payer: Medicaid Other | Attending: Internal Medicine | Admitting: Internal Medicine

## 2022-02-13 ENCOUNTER — Encounter: Payer: Self-pay | Admitting: Emergency Medicine

## 2022-02-13 DIAGNOSIS — Z20822 Contact with and (suspected) exposure to covid-19: Secondary | ICD-10-CM | POA: Diagnosis present

## 2022-02-13 DIAGNOSIS — J069 Acute upper respiratory infection, unspecified: Secondary | ICD-10-CM | POA: Insufficient documentation

## 2022-02-13 LAB — SARS CORONAVIRUS 2 BY RT PCR: SARS Coronavirus 2 by RT PCR: POSITIVE — AB

## 2022-02-13 MED ORDER — ALBUTEROL SULFATE HFA 108 (90 BASE) MCG/ACT IN AERS: 1.0000 | INHALATION_SPRAY | Freq: Four times a day (QID) | RESPIRATORY_TRACT | 0 refills | Status: AC | PRN

## 2022-02-13 MED ORDER — BENZONATATE 100 MG PO CAPS: 100.0000 mg | ORAL_CAPSULE | Freq: Three times a day (TID) | ORAL | 0 refills | Status: AC | PRN

## 2022-02-13 NOTE — Discharge Instructions (Signed)
COVID test is pending.  We will call if it is positive.  You have been sent two medications to alleviate symptoms.  Please follow-up if any symptoms persist or worsen.

## 2022-02-13 NOTE — Anesthesia Preprocedure Evaluation (Signed)
Anesthesia Evaluation    Reviewed: Allergy & Precautions, Patient's Chart, lab work & pertinent test results  History of Anesthesia Complications (+) PONV and history of anesthetic complications  Airway        Dental   Pulmonary neg pulmonary ROS, Current Smoker,           Cardiovascular hypertension,      Neuro/Psych PSYCHIATRIC DISORDERS Anxiety Depression negative neurological ROS     GI/Hepatic negative GI ROS, Neg liver ROS,   Endo/Other  Morbid obesity  Renal/GU negative Renal ROS     Musculoskeletal negative musculoskeletal ROS (+)   Abdominal   Peds  Hematology negative hematology ROS (+)   Anesthesia Other Findings   Reproductive/Obstetrics                             Anesthesia Physical Anesthesia Plan  ASA: 2  Anesthesia Plan: MAC   Post-op Pain Management: Minimal or no pain anticipated   Induction:   PONV Risk Score and Plan: 2  Airway Management Planned: Natural Airway  Additional Equipment:   Intra-op Plan:   Post-operative Plan:   Informed Consent:   Plan Discussed with: Anesthesiologist  Anesthesia Plan Comments: (Covid positive with symptoms: elective procedure cancelled)       Anesthesia Quick Evaluation

## 2022-02-13 NOTE — ED Triage Notes (Signed)
PT is present today with c/o cough, nasal congestion, and body aches. Pt sx started yesterday. Pt states that she was exposed by a family member that tested postive for covid x2 days ago
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
## 2022-02-14 ENCOUNTER — Telehealth: Payer: Self-pay | Admitting: *Deleted

## 2022-02-14 ENCOUNTER — Other Ambulatory Visit: Payer: Self-pay | Admitting: *Deleted

## 2022-02-14 ENCOUNTER — Ambulatory Visit (HOSPITAL_COMMUNITY)
Admission: RE | Admit: 2022-02-14 | Discharge: 2022-02-14 | Disposition: A | Discharge status: Home / Self Care | Payer: Medicaid Other | Attending: Internal Medicine | Admitting: Internal Medicine

## 2022-02-14 ENCOUNTER — Encounter (HOSPITAL_COMMUNITY)
Admission: RE | Disposition: A | Discharge status: Home / Self Care | Payer: Self-pay | Source: Home / Self Care | Attending: Internal Medicine

## 2022-02-14 DIAGNOSIS — Z1211 Encounter for screening for malignant neoplasm of colon: Secondary | ICD-10-CM

## 2022-02-14 SURGERY — CANCELLED PROCEDURE
Anesthesia: Monitor Anesthesia Care

## 2022-02-14 MED ORDER — PLENVU 140 G PO SOLR: 1.0000 | ORAL | 0 refills | Status: DC

## 2022-02-14 SURGICAL SUPPLY — 22 items

## 2022-02-14 NOTE — H&P (Signed)
Patient presented for outpatient colonoscopy in the hospital setting today. Unfortunately she was diagnosed yesterday evening with COVID infection and her procedure needs to be rescheduled  She was made aware of the COVID-19 diagnosis and will contact primary care if symptoms become more significant for her to consider oral antiviral therapy.

## 2022-02-14 NOTE — Telephone Encounter (Signed)
I have spoken to patient to advise her that she has been rescheduled to 04/05/22 at 930am with 8 am arrival. I have given the exact same prep she was given for colonoscopy today. Patient is adamant that she does not need a 2 day prep (plenvu + miralax) again because she is "cleaned out" now and only has constipation some times. I explained that while she is currently "cleaned out," it will have been 1.5 months before she has her procedure now and she will likely continue with constipation issues by that point. I also explained that a 2 day prep will ensure that her colon is empty of any stool so that we can easily see polyps or abnormalities within the colon. If she does not do prescribed prep, there is a large possibility that she will have remaining stool at which time there is an increased change of missing any abnormalities. Patient is adamant that the physician know she does not need this prep.

## 2022-02-14 NOTE — Telephone Encounter (Signed)
[place] for single, split dose, prep

## 2022-02-14 NOTE — Telephone Encounter (Signed)
I have spoken to patient to advise her that Dr Hilarie Fredrickson is okay with her not having double prep. I advised that she will still be given the plenvu prep and have updated instructions. She verbalizes understanding and thanked me for the update.

## 2022-02-14 NOTE — Telephone Encounter (Signed)
Patient has been rescheduled for colonoscopy at [facility] on 04/05/22 at 930 am, 800 am arrival. [place] instructions have been created and are available in [address]. Written instructions also mailed to patient's home address. New script sent to pharmacy for prep. I have left a message for patient to call back so I can advise her of this information.

## 2022-02-14 NOTE — Progress Notes (Signed)
Patient arrived to hospital prepped for colonoscopy with mask in place.  Lab results show positive Covid-19 test as of 6pm yesterday from ED visit for covid symptoms.  No notation that patient had been notified of results.    Discussed with MD and Anesthesiologist.  Plan is to cancel per policy with reschedule.  Per patient went to "urgent care yesterday for symptoms for Covid-19 and was wanting them to tell her whether to cancel for today"  She states was unaware of results.  Patient informed Covid test was positive, education given on covid guidelines and informed would need to reschedule with office after 10 days if symptom free at that time.  Patient verbalized understanding and discharged home.

## 2022-02-14 NOTE — Telephone Encounter (Signed)
-----   Message from Jerene Bears, MD sent at 02/14/2022  9:22 AM EDT ----- Pt needs to be rescheduled for colon at [facility] She presented after being dx with COVID yesterday evening in the ER Thanks JMP

## 2022-02-14 NOTE — Telephone Encounter (Signed)
I have left a message for patient to call back. I currently have availability for hospital procedure on 04/05/22 or 05/02/22. Of note, she will need two day prep (previously used plenvu/miralax).

## 2022-02-14 NOTE — Telephone Encounter (Signed)
Patient stated she would like to do procedure on 10/31. Please advise.

## 2022-02-21 ENCOUNTER — Encounter: Payer: Self-pay | Admitting: Emergency Medicine

## 2022-02-21 ENCOUNTER — Ambulatory Visit
Admission: EM | Admit: 2022-02-21 | Discharge: 2022-02-21 | Disposition: A | Discharge status: Home / Self Care | Payer: Medicaid Other | Attending: Internal Medicine | Admitting: Internal Medicine

## 2022-02-21 DIAGNOSIS — Z20822 Contact with and (suspected) exposure to covid-19: Secondary | ICD-10-CM | POA: Insufficient documentation

## 2022-02-21 DIAGNOSIS — U071 COVID-19: Secondary | ICD-10-CM | POA: Diagnosis present

## 2022-02-21 LAB — SARS CORONAVIRUS 2 BY RT PCR: SARS Coronavirus 2 by RT PCR: POSITIVE — AB

## 2022-02-21 NOTE — ED Triage Notes (Signed)
Pt is present today with concerns for Covid. Pt states she tested positive last and would like to see if she is in the clear. Denies any sx

## 2022-02-21 NOTE — Discharge Instructions (Signed)
COVID test is pending.  We will call if it is positive.
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
## 2022-03-29 ENCOUNTER — Encounter (HOSPITAL_COMMUNITY): Payer: Self-pay | Admitting: Internal Medicine

## 2022-04-01 ENCOUNTER — Other Ambulatory Visit: Payer: Self-pay

## 2022-04-01 ENCOUNTER — Encounter (HOSPITAL_COMMUNITY): Payer: Self-pay | Admitting: Internal Medicine

## 2022-04-05 ENCOUNTER — Ambulatory Visit (HOSPITAL_BASED_OUTPATIENT_CLINIC_OR_DEPARTMENT_OTHER): Payer: Medicaid Other | Admitting: Anesthesiology

## 2022-04-05 ENCOUNTER — Ambulatory Visit (HOSPITAL_COMMUNITY): Payer: Medicaid Other | Admitting: Anesthesiology

## 2022-04-05 ENCOUNTER — Encounter (HOSPITAL_COMMUNITY): Payer: Self-pay | Admitting: Internal Medicine

## 2022-04-05 ENCOUNTER — Other Ambulatory Visit: Payer: Self-pay

## 2022-04-05 ENCOUNTER — Ambulatory Visit (HOSPITAL_COMMUNITY)
Admission: RE | Admit: 2022-04-05 | Discharge: 2022-04-05 | Disposition: A | Discharge status: Home / Self Care | Payer: Medicaid Other | Attending: Internal Medicine | Admitting: Internal Medicine

## 2022-04-05 ENCOUNTER — Encounter (HOSPITAL_COMMUNITY)
Admission: RE | Disposition: A | Discharge status: Home / Self Care | Payer: Self-pay | Source: Home / Self Care | Attending: Internal Medicine

## 2022-04-05 DIAGNOSIS — D125 Benign neoplasm of sigmoid colon: Secondary | ICD-10-CM

## 2022-04-05 DIAGNOSIS — Z1211 Encounter for screening for malignant neoplasm of colon: Secondary | ICD-10-CM

## 2022-04-05 DIAGNOSIS — F418 Other specified anxiety disorders: Secondary | ICD-10-CM | POA: Diagnosis not present

## 2022-04-05 DIAGNOSIS — K5730 Diverticulosis of large intestine without perforation or abscess without bleeding: Secondary | ICD-10-CM | POA: Insufficient documentation

## 2022-04-05 DIAGNOSIS — I10 Essential (primary) hypertension: Secondary | ICD-10-CM | POA: Diagnosis not present

## 2022-04-05 DIAGNOSIS — Z1212 Encounter for screening for malignant neoplasm of rectum: Secondary | ICD-10-CM | POA: Diagnosis not present

## 2022-04-05 DIAGNOSIS — F17210 Nicotine dependence, cigarettes, uncomplicated: Secondary | ICD-10-CM | POA: Diagnosis not present

## 2022-04-05 HISTORY — DX: Prediabetes: R73.03

## 2022-04-05 HISTORY — PX: POLYPECTOMY: SHX5525

## 2022-04-05 HISTORY — PX: COLONOSCOPY WITH PROPOFOL: SHX5780

## 2022-04-05 SURGERY — COLONOSCOPY WITH PROPOFOL
Anesthesia: Monitor Anesthesia Care

## 2022-04-05 MED ORDER — LACTATED RINGERS IV SOLN: INTRAVENOUS | Status: DC | PRN

## 2022-04-05 MED ORDER — PROPOFOL 10 MG/ML IV BOLUS
INTRAVENOUS | Status: DC | PRN
  Administered 2022-04-05 (×2): 10 mg via INTRAVENOUS
  Administered 2022-04-05: 20 mg via INTRAVENOUS

## 2022-04-05 MED ORDER — SODIUM CHLORIDE 0.9 % IV SOLN: INTRAVENOUS | Status: DC

## 2022-04-05 MED ORDER — PROPOFOL 500 MG/50ML IV EMUL
INTRAVENOUS | Status: DC | PRN
  Administered 2022-04-05: 120 ug/kg/min via INTRAVENOUS

## 2022-04-05 MED ORDER — LIDOCAINE 2% (20 MG/ML) 5 ML SYRINGE
INTRAMUSCULAR | Status: DC | PRN
  Administered 2022-04-05: 100 mg via INTRAVENOUS

## 2022-04-05 SURGICAL SUPPLY — 22 items

## 2022-04-05 NOTE — Anesthesia Preprocedure Evaluation (Addendum)
Anesthesia Evaluation  Patient identified by MRN, date of birth, ID band Patient awake    Reviewed: Allergy & Precautions, NPO status , Patient's Chart, lab work & pertinent test results  History of Anesthesia Complications (+) PONV and history of anesthetic complications  Airway Mallampati: I  TM Distance: >3 FB Neck ROM: Full    Dental  (+) Teeth Intact, Dental Advisory Given   Pulmonary Current Smoker and Patient abstained from smoking.,    breath sounds clear to auscultation       Cardiovascular hypertension,  Rhythm:Regular Rate:Normal     Neuro/Psych PSYCHIATRIC DISORDERS Anxiety Depression    GI/Hepatic negative GI ROS, (+) Hepatitis -  Endo/Other    Renal/GU negative Renal ROS     Musculoskeletal  (+) Arthritis ,   Abdominal (+) + obese,   Peds  Hematology negative hematology ROS (+)   Anesthesia Other Findings   Reproductive/Obstetrics                            Anesthesia Physical Anesthesia Plan  ASA: 3  Anesthesia Plan: MAC   Post-op Pain Management:    Induction: Intravenous  PONV Risk Score and Plan: 0 and Propofol infusion  Airway Management Planned: Natural Airway and Simple Face Mask  Additional Equipment: None  Intra-op Plan:   Post-operative Plan:   Informed Consent: I have reviewed the patients History and Physical, chart, labs and discussed the procedure including the risks, benefits and alternatives for the proposed anesthesia with the patient or authorized representative who has indicated his/her understanding and acceptance.     Dental advisory given  Plan Discussed with: CRNA  Anesthesia Plan Comments:        Anesthesia Quick Evaluation

## 2022-04-05 NOTE — Anesthesia Postprocedure Evaluation (Signed)
Anesthesia Post Note  Patient: Rebekah Howard  Procedure(s) Performed: COLONOSCOPY WITH PROPOFOL POLYPECTOMY     Patient location during evaluation: PACU Anesthesia Type: MAC Level of consciousness: awake and alert Pain management: pain level controlled Vital Signs Assessment: post-procedure vital signs reviewed and stable Respiratory status: spontaneous breathing, nonlabored ventilation, respiratory function stable and patient connected to nasal cannula oxygen Cardiovascular status: stable and blood pressure returned to baseline Postop Assessment: no apparent nausea or vomiting Anesthetic complications: no   No notable events documented.  Last Vitals:  Vitals:   04/05/22 0957 04/05/22 1007  BP: 107/71 123/72  Pulse: (!) 56 64  Resp: 19 16  Temp:    SpO2: 97% 100%    Last Pain:  Vitals:   04/05/22 1007  TempSrc:   PainSc: 0-No pain                 Effie Berkshire
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
## 2022-04-05 NOTE — Transfer of Care (Signed)
Immediate Anesthesia Transfer of Care Note  Patient: Rebekah Howard  Procedure(s) Performed: COLONOSCOPY WITH PROPOFOL POLYPECTOMY  Patient Location: PACU and Endoscopy Unit  Anesthesia Type:MAC  Level of Consciousness: awake, alert , oriented and patient cooperative  Airway & Oxygen Therapy: Patient Spontanous Breathing and Patient connected to face mask oxygen  Post-op Assessment: Report given to RN and Post -op Vital signs reviewed and stable  Post vital signs: Reviewed and stable  Last Vitals:  Vitals Value Taken Time  BP 92/59 04/05/22 0950  Temp 36.4 C 04/05/22 0947  Pulse 58 04/05/22 0953  Resp 21 04/05/22 0953  SpO2 100 % 04/05/22 0953  Vitals shown include unvalidated device data.  Last Pain:  Vitals:   04/05/22 0947  TempSrc: Tympanic  PainSc: Asleep         Complications: No notable events documented.

## 2022-04-05 NOTE — Op Note (Signed)
[facility] Patient Name: Rebekah Howard Procedure Date: 04/05/2022 MRN: [id number] Attending MD: Jerene Bears , MD, [id number] Date of Birth: [birth year] CSN: [id number] Age: 61 Admit Type: Outpatient Procedure:                Colonoscopy Indications:              Screening for colorectal malignant neoplasm, Last                            colonoscopy 10 years ago Providers:                Lajuan Lines. Hilarie Fredrickson, MD, Dulcy Fanny, Darliss Cheney,                            Technician, Karis Juba, CRNA, Adair Laundry,                            CRNA Referring MD:             Aurelio Jew, PA-C Medicines:                Monitored Anesthesia Care Complications:            No immediate complications. Estimated Blood Loss:     Estimated blood loss: none. Procedure:                Pre-Anesthesia Assessment:                           - Prior to the procedure, a History and Physical                            was performed, and patient medications and                            allergies were reviewed. The patient's tolerance of                            previous anesthesia was also reviewed. The risks                            and benefits of the procedure and the sedation                            options and risks were discussed with the patient.                            All questions were answered, and informed consent                            was obtained. Prior Anticoagulants: The patient has                            taken no anticoagulant or antiplatelet agents. ASA  Grade Assessment: III - A patient with severe                            systemic disease. After reviewing the risks and                            benefits, the patient was deemed in satisfactory                            condition to undergo the procedure.                           After obtaining informed consent, the colonoscope                            was  passed under direct vision. Throughout the                            procedure, the patient's blood pressure, pulse, and                            oxygen saturations were monitored continuously. The                            CF-HQ190L ([id number]) Olympus colonoscope was                            introduced through the anus and advanced to the                            cecum, identified by the appendiceal orifice,                            ileocecal valve and palpation. The colonoscopy was                            performed without difficulty. The patient tolerated                            the procedure well. The quality of the bowel                            preparation was excellent. The ileocecal valve,                            appendiceal orifice, and rectum were photographed. Scope In: 9:28:27 AM Scope Out: 9:40:44 AM Scope Withdrawal Time: 0 hours 10 minutes 34 seconds  Total Procedure Duration: 0 hours 12 minutes 17 seconds  Findings:      The digital rectal exam was normal.      A 5 mm polyp was found in the sigmoid colon. The polyp was sessile. The       polyp was removed with a cold snare. Resection and retrieval were       complete.      Multiple  small-mouthed diverticula were found in the sigmoid colon,       descending colon and splenic flexure.      The exam was otherwise without abnormality on direct and retroflexion       views. Impression:               - One 5 mm polyp in the sigmoid colon, removed with                            a cold snare. Resected and retrieved.                           - Mild diverticulosis in the sigmoid colon, in the                            descending colon and at the splenic flexure.                           - The examination was otherwise normal on direct                            and retroflexion views. Moderate Sedation:      N/A Recommendation:           - Patient has a contact number available for                             emergencies. The signs and symptoms of potential                            delayed complications were discussed with the                            patient. Return to normal activities tomorrow.                            Written discharge instructions were provided to the                            patient.                           - Resume previous diet.                           - Continue present medications.                           - Await pathology results.                           - Repeat colonoscopy is recommended. The                            colonoscopy date will be determined after pathology  results from today's exam become available for                            review. Procedure Code(s):        --- Professional ---                           [phone], Colonoscopy, flexible; with removal of                            tumor(s), polyp(s), or other lesion(s) by snare                            technique Diagnosis Code(s):        --- Professional ---                           Z12.11, Encounter for screening for malignant                            neoplasm of colon                           D12.5, Benign neoplasm of sigmoid colon                           K57.30, Diverticulosis of large intestine without                            perforation or abscess without bleeding CPT copyright 2022 American [employer] Association. All rights reserved. The codes documented in this report are preliminary and upon coder review may  be revised to meet current compliance requirements. Jerene Bears, MD 04/05/2022 9:56:19 AM This report has been signed electronically. Number of Addenda: 0

## 2022-04-05 NOTE — Discharge Instructions (Signed)
YOU HAD AN ENDOSCOPIC PROCEDURE TODAY: Refer to the procedure report and other information in the discharge instructions given to you for any specific questions about what was found during the examination. If this information does not answer your questions, please call [place] office at [phone] to clarify.  ° °YOU SHOULD EXPECT: Some feelings of bloating in the abdomen. Passage of more gas than usual. Walking can help get rid of the air that was put into your GI tract during the procedure and reduce the bloating. If you had a lower endoscopy (such as a colonoscopy or flexible sigmoidoscopy) you may notice spotting of blood in your stool or on the toilet paper. Some abdominal soreness may be present for a day or two, also. ° °DIET: Your first meal following the procedure should be a light meal and then it is ok to progress to your normal diet. A half-sandwich or bowl of soup is an example of a good first meal. Heavy or fried foods are harder to digest and may make you feel nauseous or bloated. Drink plenty of fluids but you should avoid alcoholic beverages for 24 hours. If you had a esophageal dilation, please see attached instructions for diet.   ° °ACTIVITY: Your care partner should take you home directly after the procedure. You should plan to take it easy, moving slowly for the rest of the day. You can resume normal activity the day after the procedure however YOU SHOULD NOT DRIVE, use power tools, machinery or perform tasks that involve climbing or major physical exertion for 24 hours (because of the sedation medicines used during the test).  ° °SYMPTOMS TO REPORT IMMEDIATELY: °A gastroenterologist can be reached at any hour. Please call [phone]  for any of the following symptoms:  °Following lower endoscopy (colonoscopy, flexible sigmoidoscopy) °Excessive amounts of blood in the stool  °Significant tenderness, worsening of abdominal pains  °Swelling of the abdomen that is new, acute  °Fever of 100° or  higher  °Following upper endoscopy (EGD, EUS, ERCP, esophageal dilation) °Vomiting of blood or coffee ground material  °New, significant abdominal pain  °New, significant chest pain or pain under the shoulder blades  °Painful or persistently difficult swallowing  °New shortness of breath  °Black, tarry-looking or red, bloody stools ° °FOLLOW UP:  °If any biopsies were taken you will be contacted by phone or by letter within the next 1-3 weeks. Call [phone]  if you have not heard about the biopsies in 3 weeks.  °Please also call with any specific questions about appointments or follow up tests. ° °

## 2022-04-06 ENCOUNTER — Encounter (HOSPITAL_COMMUNITY): Payer: Self-pay | Admitting: Internal Medicine

## 2022-04-07 LAB — SURGICAL PATHOLOGY

## 2022-04-08 ENCOUNTER — Other Ambulatory Visit: Payer: Self-pay | Admitting: Physician Assistant

## 2022-04-08 DIAGNOSIS — Z1231 Encounter for screening mammogram for malignant neoplasm of breast: Secondary | ICD-10-CM

## 2022-04-11 ENCOUNTER — Encounter: Payer: Self-pay | Admitting: Internal Medicine

## 2022-04-29 ENCOUNTER — Other Ambulatory Visit: Payer: Self-pay | Admitting: Internal Medicine

## 2022-04-29 DIAGNOSIS — E785 Hyperlipidemia, unspecified: Secondary | ICD-10-CM

## 2022-04-29 DIAGNOSIS — I10 Essential (primary) hypertension: Secondary | ICD-10-CM

## 2022-07-20 ENCOUNTER — Other Ambulatory Visit: Payer: Self-pay | Admitting: Internal Medicine

## 2022-07-20 DIAGNOSIS — I10 Essential (primary) hypertension: Secondary | ICD-10-CM

## 2022-07-20 DIAGNOSIS — E785 Hyperlipidemia, unspecified: Secondary | ICD-10-CM

## 2022-07-21 NOTE — Telephone Encounter (Signed)
Refills declined. She can schedule an appointment with me to re establish or reach out to her current PCP for refills. Thanks.

## 2022-08-28 IMAGING — DX DG CHEST 2V
2 series · 2 of 2 positions shown · non-contrast
Comparison: 01/09/2011

CLINICAL DATA: Productive cough

EXAM:
CHEST - 2 VIEW

[chest pa]
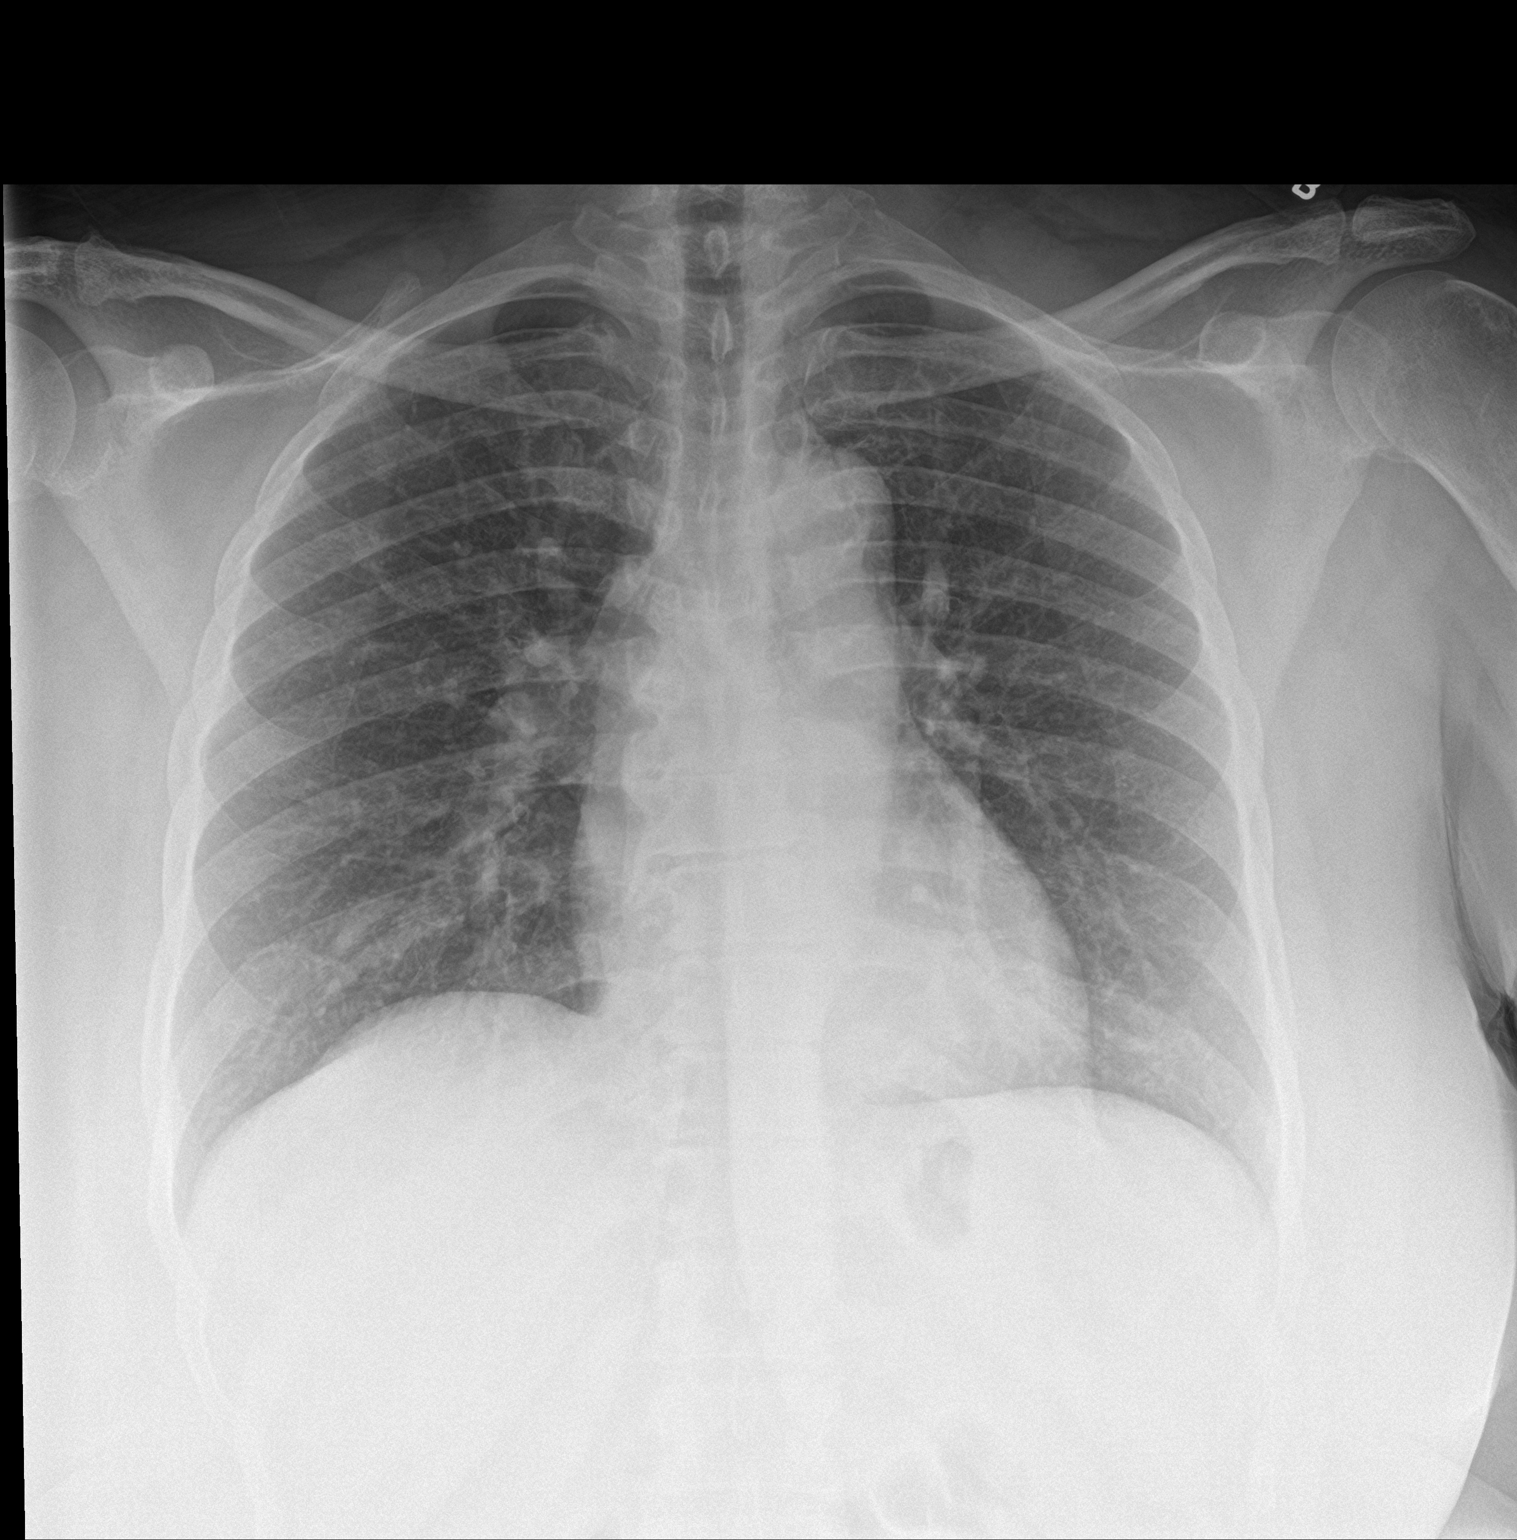

[chest lat]
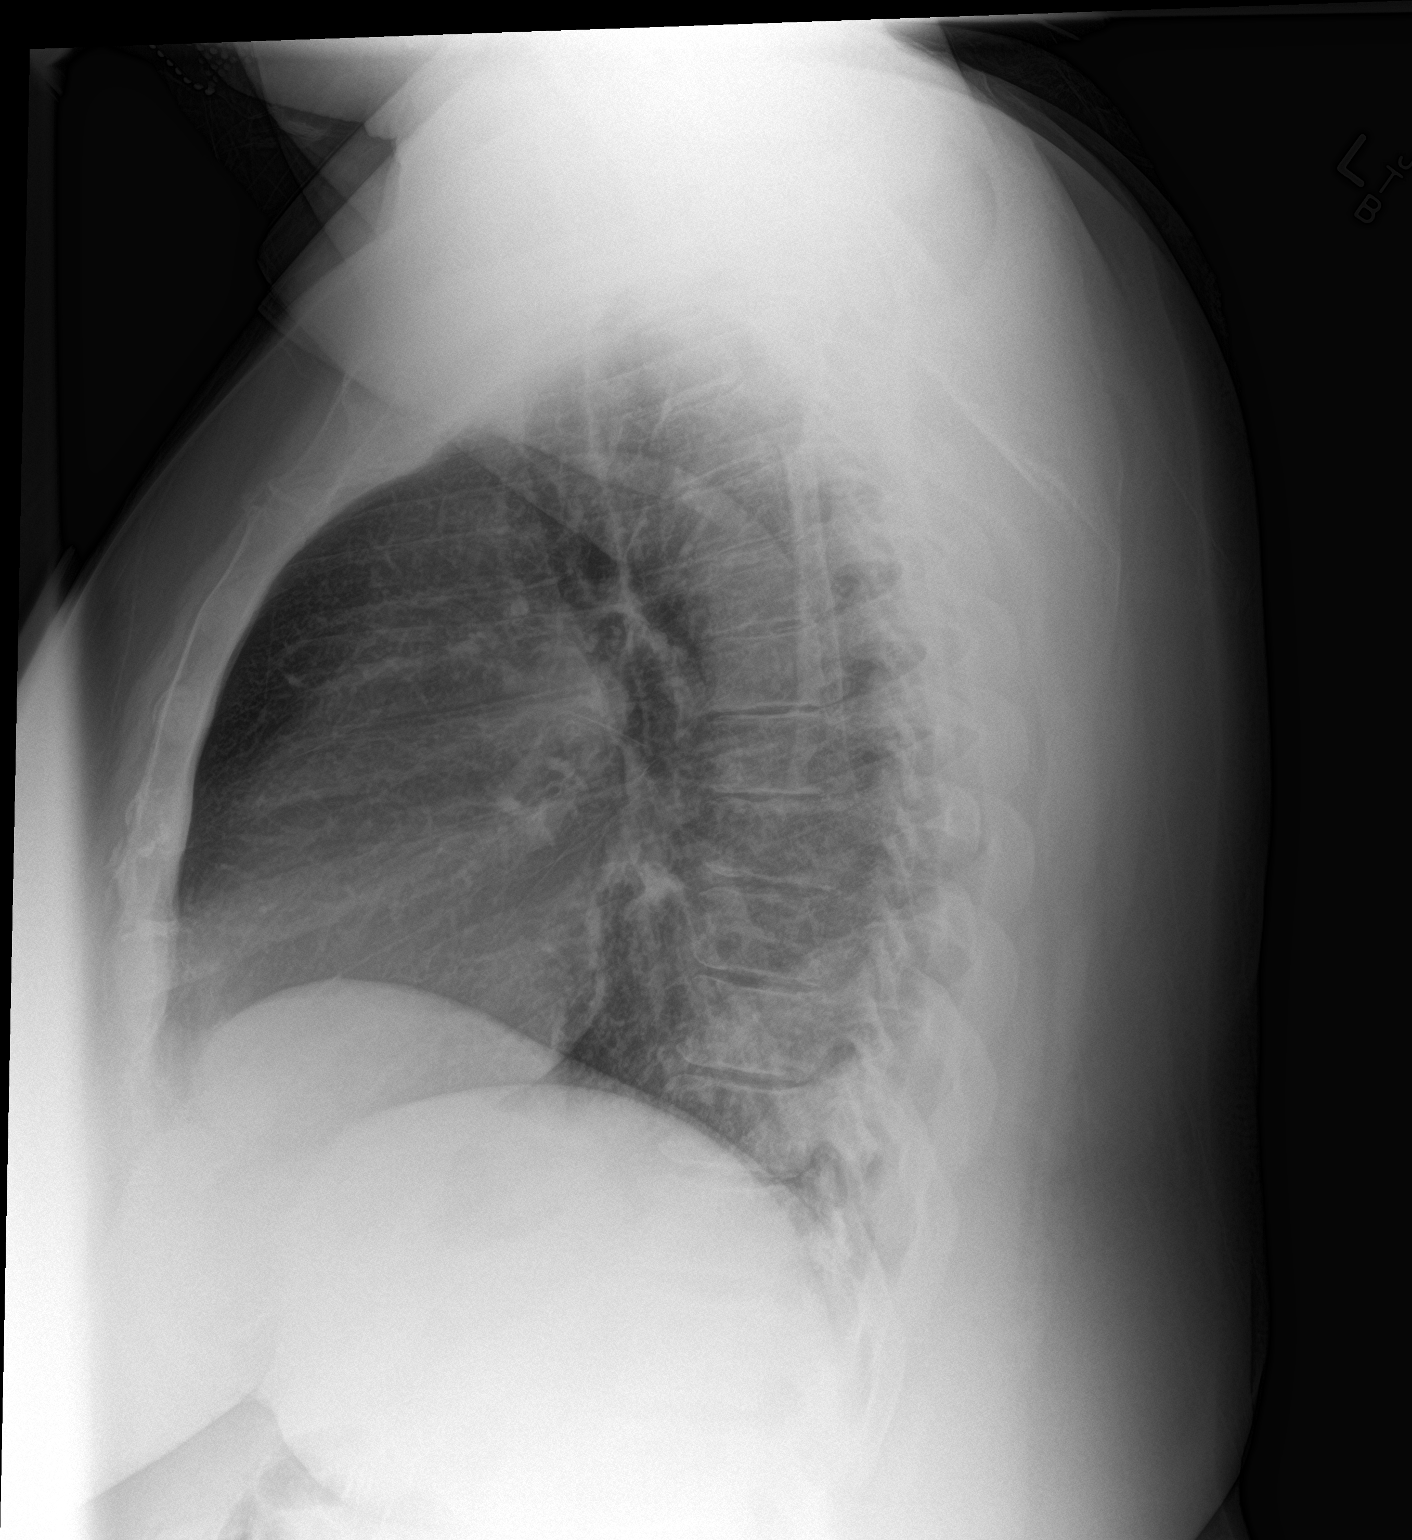

[2 of 2 positions shown; findings below may reference images not displayed]

FINDINGS: The heart size and mediastinal contours are within normal limits.
Both lungs are clear. The visualized skeletal structures are
unremarkable.
IMPRESSION: No active cardiopulmonary disease.

## 2022-08-29 ENCOUNTER — Encounter: Payer: Self-pay | Admitting: Physical Therapy

## 2022-08-29 ENCOUNTER — Other Ambulatory Visit: Payer: Self-pay

## 2022-08-29 ENCOUNTER — Ambulatory Visit: Payer: Medicaid Other | Attending: Physician Assistant | Admitting: Physical Therapy

## 2022-08-29 DIAGNOSIS — M6281 Muscle weakness (generalized): Secondary | ICD-10-CM

## 2022-08-29 DIAGNOSIS — R296 Repeated falls: Secondary | ICD-10-CM

## 2022-08-29 DIAGNOSIS — M25551 Pain in right hip: Secondary | ICD-10-CM | POA: Insufficient documentation

## 2022-08-29 DIAGNOSIS — M25562 Pain in left knee: Secondary | ICD-10-CM | POA: Diagnosis not present

## 2022-08-29 DIAGNOSIS — M25552 Pain in left hip: Secondary | ICD-10-CM
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
## 2022-08-29 NOTE — Patient Instructions (Signed)
Access Code: [email or web address] URL: [email or web address] Date: 08/29/2022 Prepared by: Hilda Blades  Exercises - Active Straight Leg Raise with Quad Set  - 1 x daily - 3 sets - 5 reps - Bridge  - 1 x daily - 3 sets - 5 reps - Clamshell  - 1 x daily - 3 sets - 10 reps - Sit to Stand with Hands on Knees  - 1 x daily - 3 sets - 5 reps

## 2022-09-19 ENCOUNTER — Encounter: Payer: Self-pay | Admitting: Physical Therapy

## 2022-09-19 ENCOUNTER — Ambulatory Visit: Payer: Medicaid Other | Attending: Physician Assistant | Admitting: Physical Therapy

## 2022-09-19 DIAGNOSIS — M25551 Pain in right hip: Secondary | ICD-10-CM | POA: Insufficient documentation

## 2022-09-19 DIAGNOSIS — M25562 Pain in left knee: Secondary | ICD-10-CM | POA: Insufficient documentation

## 2022-09-19 DIAGNOSIS — M6281 Muscle weakness (generalized): Secondary | ICD-10-CM | POA: Insufficient documentation

## 2022-09-19 DIAGNOSIS — M25552 Pain in left hip: Secondary | ICD-10-CM | POA: Insufficient documentation
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
## 2022-09-22 ENCOUNTER — Ambulatory Visit: Payer: Medicaid Other | Admitting: Physical Therapy

## 2022-09-26 ENCOUNTER — Ambulatory Visit: Payer: Medicaid Other | Admitting: Physical Therapy

## 2022-09-29 ENCOUNTER — Ambulatory Visit: Payer: Medicaid Other | Admitting: Physical Therapy

## 2022-10-03 ENCOUNTER — Ambulatory Visit: Payer: Medicaid Other | Admitting: Physical Therapy

## 2022-10-06 ENCOUNTER — Ambulatory Visit: Payer: Medicaid Other | Admitting: Physical Therapy

## 2022-10-10 ENCOUNTER — Ambulatory Visit: Payer: Medicaid Other | Admitting: Physical Therapy

## 2022-10-13 ENCOUNTER — Ambulatory Visit: Payer: Medicaid Other | Attending: Physician Assistant | Admitting: Physical Therapy

## 2022-11-21 ENCOUNTER — Encounter (HOSPITAL_COMMUNITY): Payer: Self-pay

## 2022-11-21 ENCOUNTER — Ambulatory Visit (HOSPITAL_COMMUNITY)
Admission: EM | Admit: 2022-11-21 | Discharge: 2022-11-21 | Disposition: A | Discharge status: Home / Self Care | Payer: Medicaid Other | Attending: Emergency Medicine | Admitting: Emergency Medicine

## 2022-11-21 DIAGNOSIS — M5441 Lumbago with sciatica, right side: Secondary | ICD-10-CM | POA: Diagnosis not present

## 2022-11-21 DIAGNOSIS — M5442 Lumbago with sciatica, left side: Secondary | ICD-10-CM

## 2022-11-21 DIAGNOSIS — G8929 Other chronic pain: Secondary | ICD-10-CM | POA: Diagnosis not present

## 2022-11-21 MED ORDER — KETOROLAC TROMETHAMINE 30 MG/ML IJ SOLN
30.0000 mg | Freq: Once | INTRAMUSCULAR | Status: AC
  Administered 2022-11-21: 30 mg via INTRAMUSCULAR

## 2022-11-21 MED ORDER — KETOROLAC TROMETHAMINE 30 MG/ML IJ SOLN
INTRAMUSCULAR | Status: AC
  Filled 2022-11-21: qty 1

## 2022-11-21 NOTE — Discharge Instructions (Signed)
Follow up with your provider at [facility] for further help. You can also consider going to sports medicine - see clinic information below.
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
## 2022-11-21 NOTE — ED Triage Notes (Signed)
Pt c/o lower back pain radiating down both legs x2-3 months. Now having shooting pain down her legs. States taking tylenol with no relief.

## 2023-04-21 ENCOUNTER — Other Ambulatory Visit: Payer: Self-pay

## 2023-04-21 ENCOUNTER — Ambulatory Visit
Admission: EM | Admit: 2023-04-21 | Discharge: 2023-04-21 | Disposition: A | Discharge status: Home / Self Care | Payer: Medicaid Other | Attending: Internal Medicine | Admitting: Internal Medicine

## 2023-04-21 DIAGNOSIS — J069 Acute upper respiratory infection, unspecified: Secondary | ICD-10-CM | POA: Diagnosis present

## 2023-04-21 MED ORDER — GUAIFENESIN 200 MG PO TABS: 200.0000 mg | ORAL_TABLET | ORAL | 0 refills | Status: AC | PRN

## 2023-04-21 MED ORDER — FLUTICASONE PROPIONATE 50 MCG/ACT NA SUSP: 1.0000 | Freq: Every day | NASAL | 0 refills | Status: AC

## 2023-04-21 NOTE — ED Triage Notes (Signed)
Presents requesting a covid test due to 4 days of cough, nasal congestion, watery eyes, headache over eyes. Denies fevers. States granddaughter lives with her and has been sick. +smoker

## 2023-04-21 NOTE — Discharge Instructions (Signed)
Suspect that you have a viral illness that should run its course.  I have prescribed you 2 medications to help alleviate symptoms.  Ensure adequate fluids and rest.  COVID test pending.
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
## 2023-04-22 LAB — SARS CORONAVIRUS 2 (TAT 6-24 HRS): SARS Coronavirus 2: NEGATIVE

## 2024-05-14 ENCOUNTER — Other Ambulatory Visit: Payer: Self-pay | Admitting: Physician Assistant

## 2024-05-14 DIAGNOSIS — N6489 Other specified disorders of breast: Secondary | ICD-10-CM
